# Patient Record
Sex: Male | Born: 1950 | Race: White | Hispanic: No | Smoking: Current every day smoker
Health system: Southern US, Community
[De-identification: ages and names within clinical notes are randomized; demographics above are authoritative.]

## PROBLEM LIST (undated history)

## (undated) DIAGNOSIS — I2510 Atherosclerotic heart disease of native coronary artery without angina pectoris: Secondary | ICD-10-CM

## (undated) DIAGNOSIS — M1990 Unspecified osteoarthritis, unspecified site: Secondary | ICD-10-CM

## (undated) DIAGNOSIS — E119 Type 2 diabetes mellitus without complications: Secondary | ICD-10-CM

## (undated) DIAGNOSIS — Z951 Presence of aortocoronary bypass graft: Secondary | ICD-10-CM

## (undated) DIAGNOSIS — R001 Bradycardia, unspecified: Secondary | ICD-10-CM

## (undated) DIAGNOSIS — M109 Gout, unspecified: Secondary | ICD-10-CM

## (undated) DIAGNOSIS — I714 Abdominal aortic aneurysm, without rupture, unspecified: Secondary | ICD-10-CM

## (undated) DIAGNOSIS — Z8781 Personal history of (healed) traumatic fracture: Secondary | ICD-10-CM

## (undated) DIAGNOSIS — Z8551 Personal history of malignant neoplasm of bladder: Secondary | ICD-10-CM

## (undated) DIAGNOSIS — I10 Essential (primary) hypertension: Secondary | ICD-10-CM

## (undated) DIAGNOSIS — I440 Atrioventricular block, first degree: Secondary | ICD-10-CM

## (undated) DIAGNOSIS — I4891 Unspecified atrial fibrillation: Secondary | ICD-10-CM

## (undated) DIAGNOSIS — D494 Neoplasm of unspecified behavior of bladder: Secondary | ICD-10-CM

## (undated) HISTORY — DX: Essential (primary) hypertension: I10

## (undated) HISTORY — PX: POSTERIOR LAMINECTOMY / DECOMPRESSION LUMBAR SPINE: SUR740

## (undated) HISTORY — DX: Unspecified atrial fibrillation: I48.91

## (undated) HISTORY — PX: KNEE ARTHROSCOPY: SUR90

## (undated) HISTORY — PX: CARDIOVASCULAR STRESS TEST: SHX262

## (undated) HISTORY — PX: CARDIAC CATHETERIZATION: SHX172

## (undated) HISTORY — DX: Atherosclerotic heart disease of native coronary artery without angina pectoris: I25.10

## (undated) HISTORY — PX: CORONARY ARTERY BYPASS GRAFT: SHX141

---

## 1999-07-20 ENCOUNTER — Ambulatory Visit (HOSPITAL_COMMUNITY): Admission: RE | Admit: 1999-07-20 | Discharge: 1999-07-20 | Payer: Self-pay | Admitting: Cardiology

## 1999-07-20 ENCOUNTER — Encounter: Payer: Self-pay | Admitting: *Deleted

## 1999-08-13 ENCOUNTER — Inpatient Hospital Stay (HOSPITAL_COMMUNITY): Admission: EM | Admit: 1999-08-13 | Discharge: 1999-08-20 | Payer: Self-pay | Admitting: Emergency Medicine

## 1999-08-13 ENCOUNTER — Encounter: Payer: Self-pay | Admitting: Cardiology

## 1999-08-15 ENCOUNTER — Encounter: Payer: Self-pay | Admitting: Cardiothoracic Surgery

## 1999-08-15 DIAGNOSIS — Z951 Presence of aortocoronary bypass graft: Secondary | ICD-10-CM

## 1999-08-15 HISTORY — DX: Presence of aortocoronary bypass graft: Z95.1

## 1999-08-16 ENCOUNTER — Encounter: Payer: Self-pay | Admitting: Cardiothoracic Surgery

## 1999-08-17 ENCOUNTER — Encounter: Payer: Self-pay | Admitting: Cardiothoracic Surgery

## 2000-06-26 ENCOUNTER — Encounter: Payer: Self-pay | Admitting: Emergency Medicine

## 2000-06-26 ENCOUNTER — Inpatient Hospital Stay (HOSPITAL_COMMUNITY): Admission: EM | Admit: 2000-06-26 | Discharge: 2000-07-02 | Payer: Self-pay | Admitting: Emergency Medicine

## 2000-06-26 ENCOUNTER — Encounter: Payer: Self-pay | Admitting: Orthopaedic Surgery

## 2000-06-28 ENCOUNTER — Encounter: Payer: Self-pay | Admitting: Orthopaedic Surgery

## 2000-06-30 ENCOUNTER — Encounter: Payer: Self-pay | Admitting: Orthopaedic Surgery

## 2003-03-04 ENCOUNTER — Ambulatory Visit (HOSPITAL_COMMUNITY): Admission: RE | Admit: 2003-03-04 | Discharge: 2003-03-04 | Payer: Self-pay | Admitting: Family Medicine

## 2003-03-21 ENCOUNTER — Ambulatory Visit (HOSPITAL_COMMUNITY): Admission: RE | Admit: 2003-03-21 | Discharge: 2003-03-21 | Payer: Self-pay | Admitting: Family Medicine

## 2007-12-16 ENCOUNTER — Ambulatory Visit: Payer: Self-pay | Admitting: Cardiology

## 2007-12-21 ENCOUNTER — Encounter (HOSPITAL_COMMUNITY): Admission: RE | Admit: 2007-12-21 | Discharge: 2008-01-20 | Payer: Self-pay | Admitting: Cardiology

## 2007-12-21 ENCOUNTER — Ambulatory Visit: Payer: Self-pay | Admitting: Cardiology

## 2008-08-23 ENCOUNTER — Ambulatory Visit (HOSPITAL_COMMUNITY): Admission: RE | Admit: 2008-08-23 | Discharge: 2008-08-23 | Payer: Self-pay | Admitting: Family Medicine

## 2009-02-13 DIAGNOSIS — I10 Essential (primary) hypertension: Secondary | ICD-10-CM | POA: Insufficient documentation

## 2009-02-13 DIAGNOSIS — F17200 Nicotine dependence, unspecified, uncomplicated: Secondary | ICD-10-CM | POA: Insufficient documentation

## 2009-02-13 DIAGNOSIS — I2510 Atherosclerotic heart disease of native coronary artery without angina pectoris: Secondary | ICD-10-CM

## 2009-02-21 ENCOUNTER — Encounter: Payer: Self-pay | Admitting: Adult Health

## 2009-02-21 ENCOUNTER — Ambulatory Visit: Payer: Self-pay | Admitting: Cardiology

## 2009-02-21 DIAGNOSIS — I2589 Other forms of chronic ischemic heart disease: Secondary | ICD-10-CM | POA: Insufficient documentation

## 2009-02-21 DIAGNOSIS — R0602 Shortness of breath: Secondary | ICD-10-CM

## 2009-02-21 DIAGNOSIS — E785 Hyperlipidemia, unspecified: Secondary | ICD-10-CM

## 2009-02-21 HISTORY — DX: Hyperlipidemia, unspecified: E78.5

## 2009-06-20 ENCOUNTER — Encounter (INDEPENDENT_AMBULATORY_CARE_PROVIDER_SITE_OTHER): Payer: Self-pay | Admitting: *Deleted

## 2009-07-07 ENCOUNTER — Ambulatory Visit (HOSPITAL_COMMUNITY): Admission: RE | Admit: 2009-07-07 | Discharge: 2009-07-07 | Payer: Self-pay | Admitting: Internal Medicine

## 2009-09-01 ENCOUNTER — Ambulatory Visit (HOSPITAL_COMMUNITY): Admission: RE | Admit: 2009-09-01 | Discharge: 2009-09-01 | Payer: Self-pay | Admitting: Neurosurgery

## 2009-11-08 ENCOUNTER — Encounter (INDEPENDENT_AMBULATORY_CARE_PROVIDER_SITE_OTHER): Payer: Self-pay | Admitting: *Deleted

## 2010-03-06 NOTE — Assessment & Plan Note (Signed)
Summary: past due for 6 mth f/u per Tammy/tg   Visit Type:  Follow-up Primary Provider:  Dr.Mark Nobie Putnam   History of Present Illness: Kenneth Kelly is a 55 CM who we are following with known history of CAD, s/p CABG in July 2001. This was LIMA to Cx, SVG to RCA, and RIMA to LAD.  He has not been seen in since Nov 2009.  He comes today for medication refills and continued cardiac care.  Most recent stress test 11/09 revaled negative stress EKG, Normal LV size and systolic function, but there was a "somewhat prominent physiologic apical thinning without evidence for myocardial ischemia or infarction."  He has no complaints of chest pain, DOE, fatige. He unfortuately continues to smoke 1-1.5 ppd and has been for 50+ years.  Preventive Screening-Counseling & Management  Alcohol-Tobacco     Smoking Status: quit > 6 months     Smoking Cessation Counseling: yes     Smoke Cessation Stage: precontemplative     Packs/Day: 1.5     Pack years: 55  Current Problems (verified): 1)  Dyslipidemia  (ICD-272.4) 2)  Shortness of Breath  (ICD-786.05) 3)  Other Spec Forms Chronic Ischemic Heart Disease  (ICD-414.8) 4)  Tobacco Abuse  (ICD-305.1) 5)  Hypertension  (ICD-401.9) 6)  Cad  (ICD-414.00)  Current Medications (verified): 1)  Metoprolol Tartrate 25 Mg Tabs (Metoprolol Tartrate) .... Take 1 Tab Two Times A Day 2)  Ecotrin 325 Mg Tbec (Aspirin) .... Take 1 Tab Daily 3)  Lipitor 20 Mg Tabs (Atorvastatin Calcium) .... Take 1 Tab Daily 4)  Advil Pm 200-38 Mg Tabs (Ibuprofen-Diphenhydramine Cit) .... Take As Needed 5)  Amlodipine Besylate 10 Mg Tabs (Amlodipine Besylate) .... Take 1 Tablet By Mouth Once Daily 6)  Actoplus Met 15-500 Mg Tabs (Pioglitazone Hcl-Metformin Hcl) .... Take 1 Tab Two Times A Day 7)  Ecotrin 325 Mg Tbec (Aspirin) .... Take 1 Tab Daily 8)  Metoprolol Tartrate 50 Mg Tabs (Metoprolol Tartrate) .... Take 1 Tab Two Times A Day 9)  Nabumetone 750 Mg Tabs (Nabumetone) .... Take 1 Tab  Two Times A Day 10)  Ramipril 10 Mg Caps (Ramipril) .... Take 1 Tab Daily  Allergies (verified): No Known Drug Allergies  Past History:  Past medical, surgical, family and social histories (including risk factors) reviewed, and no changes noted (except as noted below).  Past Medical History: Reviewed history from 02/13/2009 and no changes required. Current Problems:  TOBACCO ABUSE (ICD-305.1) HYPERTENSION (ICD-401.9) CAD (ICD-414.00)  Past Surgical History: Reviewed history from 02/13/2009 and no changes required. coronary artery bypass grafting times 3 08/15/1999  Family History: Reviewed history and no changes required.  Social History: Reviewed history and no changes required. Smoking Status:  quit > 6 months Packs/Day:  1.5 Pack years:  59  Vital Signs:  Patient profile:   60 year old male Height:      63 inches Weight:      151 pounds BMI:     26.85 Pulse rate:   59 / minute BP sitting:   158 / 87  (right arm)  Vitals Entered By: Dreama Saa, CNA (February 21, 2009 2:01 PM)  Physical Exam  General:  Well developed, well nourished, in no acute distress. Head:  normocephalic and atraumatic Eyes:  PERRLA/EOM intact; conjunctiva and lids normal. Ears:  TM's intact and clear with normal canals and hearing Nose:  no deformity, discharge, inflammation, or lesions Mouth:  Teeth, gums and palate normal. Oral mucosa normal. Lungs:  Clear bilaterally  to auscultation and percussion. Heart:  Distant heart sounds.  RRR. Abdomen:  Bowel sounds positive; abdomen soft and non-tender without masses, organomegaly, or hernias noted. No hepatosplenomegaly. Msk:  Back normal, normal gait. Muscle strength and tone normal. Pulses:  pulses normal in all 4 extremities Extremities:  No clubbing or cyanosis. Neurologic:  Alert and oriented x 3. Skin:  Ruddy Psych:  Normal affect.   EKG  Procedure date:  02/21/2009  Findings:      PVC's noted.    Comments:      Evidence of  remote anterior MI.    Impression & Recommendations:  Problem # 1:  CAD (ICD-414.00) Assessment Unchanged Kenneth Kelly is without complaint.  His last stress myoview did not show any new areas of ischemia.  EF 56%.  Continue current medications.  I have advised him that if he continues to smoke he increases his chance of having a second heart attack, and occluding his current grafts.  He verbalizes understanding. The following medications were removed from the medication list:    Amlodipine Besylate 5 Mg Tabs (Amlodipine besylate) .Marland Kitchen... Take 1 tablet by mouth once daily    Altace 2.5 Mg Caps (Ramipril) .Marland Kitchen... Take 1 tab daily His updated medication list for this problem includes:    Metoprolol Tartrate 25 Mg Tabs (Metoprolol tartrate) .Marland Kitchen... Take 1 tab two times a day    Ecotrin 325 Mg Tbec (Aspirin) .Marland Kitchen... Take 1 tab daily    Amlodipine Besylate 10 Mg Tabs (Amlodipine besylate) .Marland Kitchen... Take 1 tablet by mouth once daily    Metoprolol Tartrate 50 Mg Tabs (Metoprolol tartrate) .Marland Kitchen... Take 1 tab two times a day    Ramipril 10 Mg Caps (Ramipril) .Marland Kitchen... Take 1 tab daily  Problem # 2:  SHORTNESS OF BREATH (ICD-786.05) Assessment: Unchanged This is chronic for him.  He denies changes. The following medications were removed from the medication list:    Amlodipine Besylate 5 Mg Tabs (Amlodipine besylate) .Marland Kitchen... Take 1 tablet by mouth once daily    Altace 2.5 Mg Caps (Ramipril) .Marland Kitchen... Take 1 tab daily His updated medication list for this problem includes:    Metoprolol Tartrate 25 Mg Tabs (Metoprolol tartrate) .Marland Kitchen... Take 1 tab two times a day    Ecotrin 325 Mg Tbec (Aspirin) .Marland Kitchen... Take 1 tab daily    Amlodipine Besylate 10 Mg Tabs (Amlodipine besylate) .Marland Kitchen... Take 1 tablet by mouth once daily    Metoprolol Tartrate 50 Mg Tabs (Metoprolol tartrate) .Marland Kitchen... Take 1 tab two times a day    Ramipril 10 Mg Caps (Ramipril) .Marland Kitchen... Take 1 tab daily  Problem # 3:  TOBACCO ABUSE (ICD-305.1) Counseling is completed.  He has  no plans to quit.  Other Orders: Future Orders: T-Basic Metabolic Panel [id number]) ... 02/25/2009 T-Lipid Profile [id number]) ... 02/25/2009 T-Hepatic Function [id number]) ... 02/25/2009  Patient Instructions: 1)  Your physician recommends that you schedule a follow-up appointment in: 3 months 2)  Your physician recommends that you return for lab work NEXT WEEK 3)  Your physician has recommended you make the following change in your medication: Increase Amlodipine to 10mg  by mouth once daily  Prescriptions: RAMIPRIL 10 MG CAPS (RAMIPRIL) take 1 tab daily  #30 x 6   Entered by:   Larita Fife Via LPN   Authorized by:   Joni Reining, NP   Signed by:   Larita Fife Via LPN on 57/84/6962   Method used:   Electronically to        [facility] [address] #  [id number]* (retail)       [address], [place]  [address]       Ph: [phone]       Fax: [phone]   RxID:   [id number] METOPROLOL TARTRATE 50 MG TABS (METOPROLOL TARTRATE) take 1 tab two times a day  #60 x 6   Entered by:   Larita Fife Via LPN   Authorized by:   Joni Reining, NP   Signed by:   Larita Fife Via LPN on 66/44/0347   Method used:   Electronically to        [facility] [address] [phone]* (retail)       [address] [place], [place]  [address]       Ph: [phone]       Fax: [phone]   RxID:   [id number] LIPITOR 20 MG TABS (ATORVASTATIN CALCIUM) take 1 tab daily  #30 x 6   Entered by:   Larita Fife Via LPN   Authorized by:   Joni Reining, NP   Signed by:   Larita Fife Via LPN on 42/70/6237   Method used:   Electronically to        [facility] [phone]* (retail)       [address], [place]  [address]       Ph: [phone]       Fax: [phone]   RxID:   [id number] AMLODIPINE BESYLATE 10 MG TABS (AMLODIPINE BESYLATE) take 1 tablet by mouth once daily  #30 x 6   Entered by:   Larita Fife Via LPN   Authorized by:   Joni Reining, NP   Signed by:   Larita Fife Via LPN on 67/89/3810   Method  used:   Electronically to        [facility] [address] [phone]* (retail)       [address] [place]  [address]       Ph: [phone]       Fax: [phone]   RxID:   [id number]

## 2010-03-06 NOTE — Letter (Signed)
Summary: Appointment - Reminder 2  [facility] [place]. [address], [place] [address]   Phone: [phone]  Fax: [phone]     Jun 20, 2009 MRN: [id number]   Kenneth Kelly [address] [place], [place]  [address]   Dear [name],  Our records indicate that it is time to schedule a follow-up appointment.  Joni Reining NP        recommended that you follow up with [place] in   05/2009         . It is very important that we reach you to schedule this appointment. We look forward to participating in your health care needs. Please contact us at the number listed above at your earliest convenience to schedule your appointment.  If you are unable to make an appointment at this time, give [place] a call so we can update our records.     Sincerely,   Glass blower/designer

## 2010-03-06 NOTE — Letter (Signed)
Summary: Appointment - Reminder 2  [facility] [place]. [address], [place] [address]   Phone: [phone]  Fax: [phone]     November 08, 2009 MRN: [id number]   Kenneth Kelly [address] [place], [place]  [address]   Dear [name],  Our records indicate that it is time to schedule a follow-up appointment.    Joni Reining NP       recommended that you follow up with [place] in    05/24/09        . It is very important that we reach you to schedule this appointment. We look forward to participating in your health care needs. Please contact us at the number listed above at your earliest convenience to schedule your appointment.  If you are unable to make an appointment at this time, give [place] a call so we can update our records.     Sincerely,   Glass blower/designer

## 2010-04-21 LAB — BASIC METABOLIC PANEL
BUN: 14 mg/dL (ref 6–23)
CO2: 29 mEq/L (ref 19–32)
Calcium: 9.3 mg/dL (ref 8.4–10.5)
Chloride: 104 mEq/L (ref 96–112)
Creatinine, Ser: 0.78 mg/dL (ref 0.4–1.5)
GFR calc Af Amer: >60 mL/min (ref >60)
GFR calc non Af Amer: >60 mL/min (ref >60)
Glucose, Bld: 158 mg/dL — ABNORMAL HIGH (ref 70–99)
Potassium: 4.5 mEq/L (ref 3.5–5.1)
Sodium: 139 mEq/L (ref 135–145)

## 2010-04-21 LAB — GLUCOSE, CAPILLARY
Glucose-Capillary: 159 mg/dL — ABNORMAL HIGH (ref 70–99)
Glucose-Capillary: 166 mg/dL — ABNORMAL HIGH (ref 70–99)
Glucose-Capillary: 174 mg/dL — ABNORMAL HIGH (ref 70–99)
Glucose-Capillary: 239 mg/dL — ABNORMAL HIGH (ref 70–99)

## 2010-04-21 LAB — CBC
HCT: 43.5 % (ref 39.0–52.0)
Hemoglobin: 14.8 g/dL (ref 13.0–17.0)
MCH: 34 pg (ref 26.0–34.0)
MCHC: 34 g/dL (ref 30.0–36.0)
MCV: 100.2 fL — ABNORMAL HIGH (ref 78.0–100.0)
Platelets: 220 10*3/uL (ref 150–400)
RBC: 4.34 MIL/uL (ref 4.22–5.81)
RDW: 12.8 % (ref 11.5–15.5)
WBC: 9.2 10*3/uL (ref 4.0–10.5)

## 2010-04-21 LAB — SURGICAL PCR SCREEN
MRSA, PCR: NEGATIVE
Staphylococcus aureus: POSITIVE — AB

## 2010-06-19 NOTE — Assessment & Plan Note (Signed)
[facility]                       [place] [facility] OFFICE NOTE   NAME:Kenneth Kelly, Kenneth Kelly                       MRN:          [id number]  DATE:12/16/2007                            DOB:          Jun 26, 1950    PRIMARY CARE PHYSICIAN:  Corrie Mckusick, MD   REASON FOR VISIT:  Re-established cardiology followup.   HISTORY OF PRESENT ILLNESS:  Kenneth Kelly is a 60 year old male followed in  the past by Dr. Daleen Squibb with a history of multivessel cardiovascular  disease status post coronary artery bypass grafting in July 2001  including a LIMA to the circumflex, saphenous vein graft to the right  coronary artery, and a RIMA to the left anterior descending.  His left  ventricular ejection fraction was 56% at that time with moderate  anterior hypokinesis and no significant mitral regurgitation.  He has  not had any regular cardiology followup since 2001.  He continues to  smoke cigarettes and otherwise reports compliance with his medications  which are outlined below.  He does not exercise regularly.  Recent labs  show actually fairly good lipid control with an LDL of 70, HDL 46, total  cholesterol 123, and triglycerides of 35.  He had a hemoglobin of 14.8,  potassium of 4.1, BUN of 16, and a creatinine of 0.84.  His  electrocardiogram today shows sinus rhythm with a prolonged PR interval  of 212 msec, left atrial enlargement, and decreased anterior R-wave  progression suggestive of previous infarct with nonspecific T-wave  changes.  Symptomatically, he denies any significant angina at  relatively low levels of activity, and he has had no problems with  orthopnea, PND, palpitations, or syncope.  Today, we talked about  smoking cessation, followup ischemic testing, and further medication  titration for blood pressure control.   ALLERGIES:  No known drug allergies.   Present medications include:  1. Enteric-coated aspirin 325 mg p.o. daily.  2. Metoprolol 50 mg  p.o. b.i.d.  3. Lipitor 20 mg p.o. daily.  4. Hydrochlorothiazide 25 mg p.o. daily.  5. Actos plus metformin 50 mg/500 mg p.o. b.i.d.  6. Ramipril 10 mg p.o. daily.   REVIEW OF SYSTEMS:  As outlined above.  He denies any obvious  claudication symptoms.  Otherwise, negative.   PHYSICAL EXAMINATION:  VITAL SIGNS:  Blood pressure 160/100, heart rate  is in the 60s and regular, weight is 158 pounds, height 5 feet 3 inches.  The patient is comfortable and in no acute distress.  HEENT:  Conjunctiva is normal.  Oropharynx is clear.  NECK:  Supple.  No loud carotid bruits.  No thyromegaly.  LUNGS:  Exhibit diminished breath sounds.  No labored breathing or  wheezing.  CARDIAC:  A regular rate and rhythm.  No obvious S3 gallop.  Soft  systolic murmur noted.  No pericardial rub.  ABDOMEN:  Soft, nontender.  No bruits.  No pulsatile mass.  EXTREMITIES:  Exhibit no frank pitting edema.  Distal pulses are 2+.  SKIN:  Warm and dry.  MUSCULOSKELETAL:  No kyphosis noted.  NEUROPSYCHIATRIC:  The patient is alert and oriented x3.  Affect  is  somewhat blunted.   IMPRESSION AND RECOMMENDATIONS:  1. Multivessel cardiovascular disease with preserved ejection fraction      status post previous coronary artery bypass grafting as detailed      above in 2001.  At this point, I plan a followup adenosine Myoview      on medical therapy for ischemic surveillance and will plan to      otherwise see him back in 6 months assuming this test reveals no      progressive concerning findings.  He has evidence of previous      anterior wall infarction based on his electrocardiogram.  He is not      reporting any heart failure symptoms or progressive angina      symptoms.  2. Ongoing tobacco abuse.  Today, we talked about smoking cessation.      He states that he was able to quit for approximately a year after      his surgery.  3. Hyperlipidemia, well controlled on present regimen.  4. Hypertension, not optimally  controlled.  We would prefer not to      advance his beta-blocker any further for fear of making him      symptomatic due to progressive bradycardia.  His other      antihypertensives are well dosed.  We will add Norvasc 5 mg daily      and can follow him from there.     Jonelle Sidle, MD  Electronically Signed    SGM/MedQ  DD: 12/16/2007  DT: 12/17/2007  Job #: [id number]   cc:   Corrie Mckusick, M.D.

## 2010-06-22 NOTE — Op Note (Signed)
[place]  Patient:    Kenneth Kelly, Kenneth Kelly                 MRN: [id number] Proc. Date: 08/15/99 Adm. Date:  [id number] Attending:  Waldo Laine CC:         Gwenith Daily. Tyrone Sage, M.D.             Thomas C. Wall, M.D. [facility]                           Operative Report  PREOPERATIVE DIAGNOSIS:  Coronary occlusive disease.  POSTOPERATIVE DIAGNOSIS:  Coronary occlusive disease.  OPERATION PERFORMED:   Coronary artery bypass grafting x 3 with the left internal mammary artery to the circumflex coronary artery, the right internal mammary to the left anterior descending coronary artery and reversed saphenous vein graft to the distal right coronary artery.  SURGEON:  Gwenith Daily. Tyrone Sage, M.D.  ASSISTANT:  Sherrie George, P.A.  ANESTHESIA:  General.  INDICATIONS FOR PROCEDURE:  The patient is a 60 year old male with previous smoking history and strong family history of coronary occlusive disease who presented with vague chest discomfort.  He had underwent stress test which was positive leading to cardiac catheterization.  At the time of catheterization he was found to have a long segmental 50% stenosis of the right coronary artery, total occlusion of the left anterior descending coronary artery past the take-off of the diagonal and greater than 80% stenosis of the large circumflex branch.  Overall ventricular function appeared preserved.  Because of the patients symptoms and three-vessel disease and positive stress test, coronary artery bypass grafting was recommended.  The patient agreed and signed informed consent.  DESCRIPTION OF PROCEDURE:  With Swan-Ganz and arterial line monitors in place, the patient underwent general endotracheal anesthesia without incident.  The skin of the chest and legs was prepped with Betadine and draped in the usual sterile manner.  A segment of vein was harvested from the right lower extremity.  A median  sternotomy was performed.  The left internal mammary artery was dissected down as a pedicle graft.  The distal artery was divided and had excellent free flow.  In similar fashion, the right internal mammary artery was dissected down and had good free flow.  Pericardium was opened. Overall ventricular function appeared preserved.  Patient was systemically heparinized.  The ascending aorta and the right atrium were cannulated in the aortic root.  A bent cardioplegia needle was introduced into the ascending aorta.  The patient was placed on cardiopulmonary bypass at 2.4 L per minute per meter squared.  Sites for anastomosis were selected and dissected out of the epicardium.  Consideration of placing the right mammary to the circumflex was entertained though the length was not long enough to go to the transverse sinus.  It was decided to place the left mammary to the circumflex and the right mammary to the LAD and a vein graft to the right coronary artery.  The patients body temperature was then cooled to 30 degrees.  The aortic crossclamp was applied.  500 cc of cold blood potassium cardioplegia was administered with rapid diastolic arrest of the heart.  Myocardial and septal temperature was monitored throughout the crossclamp period.  Attention was turned first to the distal right coronary artery which was opened and admitted a 1.5 mm probe.  Using a running 7-0 Prolene, distal anastomosis was performed.  Attention was then turned to the  circumflex coronary artery which was a thickened vessel but admitted a 1.5 mm probe.  Using a running 8-0 Prolene the left internal mammary artery was anastomosed to the circumflex coronary artery.  The vessel was tacked to the epicardium.  Attention was then turned to the left anterior descending coronary artery which was totally occluded vessel.  In the midportion, the artery was opened and admitted a 1.5 mm probe a short distance proximally and a 1 mm probe  distally.  Using a running 8-0 Prolene, a right internal mammary artery as a pedicle graft was anastomosed to the left anterior descending coronary artery.  With release of the Edwards bulldog on both the right mammary and the left mammary, there was prompt rise in myocardial septal temperature.  The aortic cross clamp was removed.  Total cross clamp time was 55 minutes.  The patient required electrical defibrillation to return into a sinus rhythm.  Partial occlusion clamp was placed on the ascending aorta.  A single punch aortotomy was performed.  The vein graft to the right coronary artery was anastomosed to the ascending aorta.  Air was evacuated from the graft and a partial occlusion clamp was removed.  Sites of anastomosis were inspected and free of bleeding. The patient was then ventilated and weaned from cardiopulmonary bypass without difficulty.  He remained hemodynamically stable.  He was decannulated in the usual fashion.  Protamine sulfate was administered.  With the operative field hemostatic, two atrial and two ventricular pacing wires were applied.  A left pleural tube and a right pleural tube were placed.  The right mammary crossing the midline over the ascending aorta was covered with mediastinal tissue to protect the ____________ The sternum was closed with #6 stainless steel wire, the fascia was closed with interrupted 0 Vicryl and running 3-0 Vicryl in the subcutaneous tissue and 4-0 subcuticular stitch in the skin edges.  Dry dressings were applied.  The sponge and needle counts were reported as correct at the completion of the procedure.  The patient tolerated the procedure without obvious complication and was transferred to the surgical intensive care unit for further postoperative care. DD:  08/15/99 TD:  08/15/99 Job: [id number]

## 2010-06-22 NOTE — Discharge Summary (Signed)
[place]. [facility]  Patient:    Kenneth Kelly, Kenneth Kelly                 MRN: [id number] Adm. Date:  [id number] Disc. Date: 08/20/99 Attending:  Waldo Laine Dictator:   Carlye Grippe CC:         [facility]; Valera Castle, M.D.             Gwenith Daily Tyrone Sage, M.D., CVTS; Patrica Duel, M.D.                           Discharge Summary  HISTORY OF PRESENT ILLNESS:  This is a 60 year old male with known coronary artery disease having previously been cathed on July 20, 1999 by Dr. Gerri Spore.  At that time he was noted to have 30% distal left main disease, 40% proximal LAD disease, a total occlusion of the mid-LAD, and the mid to distal LAD was filled by left-to-left collaterals.  Additionally, there was a 30% lesion at the diagonal #1 origin, a 25% left circumflex lesion, an 80% mid and 80% distal circumflex lesion, an 80% stenosis of the obtuse marginal #3, a 40% proximal right coronary artery stenosis with moderate hypokinesis of the anteroapical wall and a 66% ejection fraction with no mitral regurgitation. The patient was seen on the date of admission for preop labs for coronary artery bypass grafting originally scheduled for August 15, 1999.  During that procedure, left-sided chest pain presented and the patient was felt to require admission for further evaluation and treatment prior to his surgery.  PAST MEDICAL HISTORY:  1. Coronary artery disease as described above.  2. Hypertension.  3. Tobacco use.  MEDICATIONS ON ADMISSION:  1. Metoprolol 25 mg b.i.d.  2. Altace 2.5 mg q.d.  3. Aspirin 1 q.d.  ALLERGIES:  No known allergies.  FAMILY HISTORY, SOCIAL HISTORY, REVIEW OF SYSTEMS, AND PHYSICAL EXAM:  Please see the history and physical done at time of admission.  HOSPITAL COURSE:  The patient was admitted.  The patient had his serial enzymes checked and these were negative for myocardial infarction.  The patient was also  noted to be bradycardic on admission and Lopressor dosage was decreased.  The patient was reevaluated by Dr. Tyrone Sage and the surgery he was scheduled.  PROCEDURE:  On August 15, 1999 the patient underwent the following procedure: Coronary artery bypass grafting x 3.  The following grafts were placed:  1. Left internal mammary artery to the circumflex.  2. Saphenous vein graft to the right coronary artery.  3. Right internal mammary artery to the LAD.  Crossclamp time was 105     minutes, pump time 55 minutes.  The patient tolerated the procedure well     and was taken to the surgical intensive care unit in stable condition.  POSTOPERATIVE HOSPITAL COURSE:  The patient has done quite well.  He was extubated without difficulty.  Routine lines and monitoring were discontinued in the routine step-wise manner and he remained hemodynamically stable. Cardiac index postop day #1 was noted at 3.2 and he had good urine output.  He was started on a course of gentle diuresis and aggressive pulmonary toilet was initiated as he had some difficulty with oxygen weaning.  He responded well to these measures and oxygen has been weaned.  Additionally, he is tolerating routine cardiac rehabilitation, phase 1, modalities.  His incisions are healing well without signs of infection.  He  is tolerating diet.  He is maintaining a normal sinus rhythm.  He is overall felt to be tentatively stable for discharge on the morning of August 21, 1999 pending morning reevaluation and, in particular, his cardiopulmonary status.  MEDICATIONS ON DISCHARGE:  1. Enteric-coated aspirin 325 mg q.d.  2. Altace 5 mg q.d.  3. Lopressor 25 mg b.i.d.  4. Tylox 1-2 q.4-6h. p.r.n.  5. Multivitamin 1 q.d.  FOLLOWUP:  Followup will include Dr. Daleen Squibb in two weeks, Dr. Tyrone Sage in three weeks.  FINAL DIAGNOSES:  1. Severe coronary artery disease.  2. Hypertension.  3. Tobacco use.  Most recent laboratory showed (August 17, 1999)  electrolytes - sodium 138, potassium 4.3, chloride 99, CO2 31, glucose 126, BUN 10, creatinine 0.8.  CBC showed white blood cell count 13.7, hemoglobin 10.2, hematocrit 29.5, platelet count 140,000.  INSTRUCTIONS:  The patient will receive written instructions regarding medications, activity, diet, wound care, and followup. DD:  08/19/99 TD:  08/19/99 Job: [id number]

## 2010-06-22 NOTE — Cardiovascular Report (Signed)
[place]. [facility]  Patient:    Kenneth Kelly, Kenneth Kelly                 MRN: [id number] Proc. Date: 07/20/99 Adm. Date:  [id number] Disc. Date: [id number] Attending:  Daisey Must CC:         Jonell Cluck, M.D., [place], [place]             Jesse Sans. Wall, M.D. [facility]             Cardiac Catheterization Lab                        Cardiac Catheterization  PROCEDURE PERFORMED:  Left heart catheterization with coronary angiography and left ventriculography.  INDICATIONS:  [name] is a 60 year old male with multiple cardiovascular risk factors.  He underwent a routine screening exercise treadmill test which was markedly positive for ischemia at a low workload.  He was referred for cardiac catheterization.  PROCEDURAL NOTE:  A 6-French sheath was placed in the right femoral artery. Standard Judkins 6-French catheters were utilized.  Contrast was Omnipaque. There were no complications.  RESULTS:  Hemodynamics:  Left ventricular pressure 140/22.  Aortic pressure 136/78.  No aortic valve gradient.  Left ventriculogram:  There is moderate hypokinesis of the anterior apical wall.  Ejection fraction calculated at 66%.  No mitral regurgitation.  Coronary arteriography (right dominant): 1. The left main has a distal 30% stenosis. 2. The LAD has a proximal tubular 40% stenosis.  The mid LAD is 100% occluded    just after a septal perforator.  The mid to distal LAD beyond the occlusion    filled by left-to-left collaterals.  The length of this total occlusion in    the mid LAD appears to be about 1 cm long, and there is significant    calcification at the distal aspect of this occlusion.  There is a normal    sized first diagonal arising from the proximal LAD which has a 30% stenosis    at its origin. 3. The left circumflex has a 25% stenosis in the mid vessel followed by an 80%    stenosis in the mid vessel across the origin of OM-2.  In the distal vessel  is an 80% stenosis.  There is a small OM-1, small OM-2, and a very large    branch OM-3 jeopardized by both of the above 80% stenoses.  There is a 30%    stenosis in the proximal portion of OM-3. 4. The right coronary artery has a tubular 40% stenosis in the proximal    vessel.  It gives rise to a normal sized posterior descending artery, a    small first posterolateral branch, and a normal second posterolateral    branch.  IMPRESSIONS: 1. Left ventricular systolic function characterized by hypokinesis of the    anterior apical wall but overall preserved ejection fraction. 2. Significant two-vessel coronary artery disease as described.  The total    occlusion of the left anterior descending artery does not appear to be    amenable to percutaneous intervention because of technical features    described above.  The left circumflex is amenable to percutaneous    intervention.  These results were discussed with Dr. Daleen Squibb.  Because of    incomplete revascularization that the percutaneous route would provide,    the preference at this time is for coronary bypass grafting.  PLAN:  The patient will be  referred for evaluation for coronary artery bypass grafting on an elective basis.  If he is felt not to be a surgical candidate due to anatomical considerations or other, percutaneous intervention of the left circumflex could be performed.DD:  07/20/99 TD:  07/23/99 Job: [id number]

## 2011-01-22 ENCOUNTER — Encounter: Payer: Self-pay | Admitting: Cardiology

## 2016-01-04 DIAGNOSIS — E782 Mixed hyperlipidemia: Secondary | ICD-10-CM | POA: Diagnosis not present

## 2016-01-04 DIAGNOSIS — E663 Overweight: Secondary | ICD-10-CM | POA: Diagnosis not present

## 2016-01-04 DIAGNOSIS — M549 Dorsalgia, unspecified: Secondary | ICD-10-CM | POA: Diagnosis not present

## 2016-01-04 DIAGNOSIS — E119 Type 2 diabetes mellitus without complications: Secondary | ICD-10-CM | POA: Diagnosis not present

## 2016-01-04 DIAGNOSIS — R310 Gross hematuria: Secondary | ICD-10-CM | POA: Diagnosis not present

## 2016-01-04 DIAGNOSIS — Z6826 Body mass index (BMI) 26.0-26.9, adult: Secondary | ICD-10-CM | POA: Diagnosis not present

## 2016-01-04 DIAGNOSIS — I10 Essential (primary) hypertension: Secondary | ICD-10-CM | POA: Diagnosis not present

## 2016-01-10 ENCOUNTER — Other Ambulatory Visit (HOSPITAL_COMMUNITY): Payer: Self-pay | Admitting: Registered Nurse

## 2016-01-10 DIAGNOSIS — M549 Dorsalgia, unspecified: Secondary | ICD-10-CM

## 2016-01-10 DIAGNOSIS — R310 Gross hematuria: Secondary | ICD-10-CM

## 2016-01-12 ENCOUNTER — Ambulatory Visit (HOSPITAL_COMMUNITY)
Admission: RE | Admit: 2016-01-12 | Discharge: 2016-01-12 | Disposition: A | Discharge status: Home / Self Care | Payer: Commercial Managed Care - HMO | Source: Ambulatory Visit | Attending: Registered Nurse | Admitting: Registered Nurse

## 2016-01-12 DIAGNOSIS — R310 Gross hematuria: Secondary | ICD-10-CM | POA: Diagnosis not present

## 2016-01-12 DIAGNOSIS — N400 Enlarged prostate without lower urinary tract symptoms: Secondary | ICD-10-CM | POA: Insufficient documentation

## 2016-01-12 DIAGNOSIS — I714 Abdominal aortic aneurysm, without rupture: Secondary | ICD-10-CM | POA: Diagnosis not present

## 2016-01-12 DIAGNOSIS — K8020 Calculus of gallbladder without cholecystitis without obstruction: Secondary | ICD-10-CM | POA: Insufficient documentation

## 2016-01-12 DIAGNOSIS — M4186 Other forms of scoliosis, lumbar region: Secondary | ICD-10-CM | POA: Diagnosis not present

## 2016-01-12 DIAGNOSIS — Z951 Presence of aortocoronary bypass graft: Secondary | ICD-10-CM | POA: Diagnosis not present

## 2016-01-12 DIAGNOSIS — I700 Atherosclerosis of aorta: Secondary | ICD-10-CM | POA: Insufficient documentation

## 2016-01-12 DIAGNOSIS — N329 Bladder disorder, unspecified: Secondary | ICD-10-CM | POA: Insufficient documentation

## 2016-01-12 DIAGNOSIS — M549 Dorsalgia, unspecified: Secondary | ICD-10-CM

## 2016-01-12 MED ORDER — IOPAMIDOL (ISOVUE-300) INJECTION 61%
100.0000 mL | Freq: Once | INTRAVENOUS | Status: AC | PRN
  Administered 2016-01-12: 100 mL via INTRAVENOUS

## 2016-01-18 ENCOUNTER — Other Ambulatory Visit: Payer: Self-pay | Admitting: Urology

## 2016-01-18 DIAGNOSIS — I714 Abdominal aortic aneurysm, without rupture: Secondary | ICD-10-CM | POA: Diagnosis not present

## 2016-01-18 DIAGNOSIS — C670 Malignant neoplasm of trigone of bladder: Secondary | ICD-10-CM | POA: Diagnosis not present

## 2016-01-18 DIAGNOSIS — R310 Gross hematuria: Secondary | ICD-10-CM | POA: Diagnosis not present

## 2016-01-18 DIAGNOSIS — M109 Gout, unspecified: Secondary | ICD-10-CM | POA: Diagnosis not present

## 2016-01-22 ENCOUNTER — Encounter (HOSPITAL_COMMUNITY)
Admission: RE | Admit: 2016-01-22 | Discharge: 2016-01-22 | Disposition: A | Discharge status: Home / Self Care | Payer: Commercial Managed Care - HMO | Source: Ambulatory Visit | Attending: Urology | Admitting: Urology

## 2016-01-22 ENCOUNTER — Encounter (HOSPITAL_COMMUNITY): Payer: Self-pay

## 2016-01-22 DIAGNOSIS — E119 Type 2 diabetes mellitus without complications: Secondary | ICD-10-CM

## 2016-01-22 DIAGNOSIS — Z01810 Encounter for preprocedural cardiovascular examination: Secondary | ICD-10-CM | POA: Insufficient documentation

## 2016-01-22 DIAGNOSIS — I10 Essential (primary) hypertension: Secondary | ICD-10-CM

## 2016-01-22 DIAGNOSIS — I2510 Atherosclerotic heart disease of native coronary artery without angina pectoris: Secondary | ICD-10-CM | POA: Diagnosis not present

## 2016-01-22 DIAGNOSIS — Z8042 Family history of malignant neoplasm of prostate: Secondary | ICD-10-CM | POA: Diagnosis not present

## 2016-01-22 DIAGNOSIS — I252 Old myocardial infarction: Secondary | ICD-10-CM | POA: Diagnosis not present

## 2016-01-22 DIAGNOSIS — N138 Other obstructive and reflux uropathy: Secondary | ICD-10-CM | POA: Diagnosis not present

## 2016-01-22 DIAGNOSIS — M109 Gout, unspecified: Secondary | ICD-10-CM | POA: Diagnosis not present

## 2016-01-22 DIAGNOSIS — Z7984 Long term (current) use of oral hypoglycemic drugs: Secondary | ICD-10-CM | POA: Diagnosis not present

## 2016-01-22 DIAGNOSIS — M1990 Unspecified osteoarthritis, unspecified site: Secondary | ICD-10-CM | POA: Diagnosis not present

## 2016-01-22 DIAGNOSIS — I714 Abdominal aortic aneurysm, without rupture: Secondary | ICD-10-CM | POA: Diagnosis not present

## 2016-01-22 DIAGNOSIS — Z951 Presence of aortocoronary bypass graft: Secondary | ICD-10-CM | POA: Diagnosis not present

## 2016-01-22 DIAGNOSIS — Z01812 Encounter for preprocedural laboratory examination: Secondary | ICD-10-CM

## 2016-01-22 DIAGNOSIS — F17210 Nicotine dependence, cigarettes, uncomplicated: Secondary | ICD-10-CM | POA: Diagnosis not present

## 2016-01-22 DIAGNOSIS — E7800 Pure hypercholesterolemia, unspecified: Secondary | ICD-10-CM | POA: Diagnosis not present

## 2016-01-22 DIAGNOSIS — Z833 Family history of diabetes mellitus: Secondary | ICD-10-CM | POA: Diagnosis not present

## 2016-01-22 DIAGNOSIS — F329 Major depressive disorder, single episode, unspecified: Secondary | ICD-10-CM | POA: Diagnosis not present

## 2016-01-22 DIAGNOSIS — D494 Neoplasm of unspecified behavior of bladder: Secondary | ICD-10-CM | POA: Diagnosis present

## 2016-01-22 DIAGNOSIS — C670 Malignant neoplasm of trigone of bladder: Secondary | ICD-10-CM | POA: Diagnosis not present

## 2016-01-22 DIAGNOSIS — N401 Enlarged prostate with lower urinary tract symptoms: Secondary | ICD-10-CM | POA: Diagnosis not present

## 2016-01-22 DIAGNOSIS — K219 Gastro-esophageal reflux disease without esophagitis: Secondary | ICD-10-CM | POA: Diagnosis not present

## 2016-01-22 HISTORY — DX: Gout, unspecified: M10.9

## 2016-01-22 HISTORY — DX: Neoplasm of unspecified behavior of bladder: D49.4

## 2016-01-22 LAB — CBC
HCT: 38 % — ABNORMAL LOW (ref 39.0–52.0)
HEMOGLOBIN: 13 g/dL (ref 13.0–17.0)
MCH: 32.5 pg (ref 26.0–34.0)
MCHC: 34.2 g/dL (ref 30.0–36.0)
MCV: 95 fL (ref 78.0–100.0)
Platelets: 227 10*3/uL (ref 150–400)
RBC: 4 MIL/uL — ABNORMAL LOW (ref 4.22–5.81)
RDW: 12.5 % (ref 11.5–15.5)
WBC: 7.6 10*3/uL (ref 4.0–10.5)

## 2016-01-22 LAB — GLUCOSE, CAPILLARY: GLUCOSE-CAPILLARY: 171 mg/dL — AB (ref 65–99)

## 2016-01-22 LAB — BASIC METABOLIC PANEL
ANION GAP: 9 (ref 5–15)
BUN: 14 mg/dL (ref 6–20)
CALCIUM: 9.3 mg/dL (ref 8.9–10.3)
CO2: 30 mmol/L (ref 22–32)
CREATININE: 0.88 mg/dL (ref 0.61–1.24)
Chloride: 104 mmol/L (ref 101–111)
GFR calc Af Amer: >60 mL/min (ref >60)
GFR calc non Af Amer: >60 mL/min (ref >60)
GLUCOSE: 176 mg/dL — AB (ref 65–99)
Potassium: 4.2 mmol/L (ref 3.5–5.1)
Sodium: 143 mmol/L (ref 135–145)

## 2016-01-22 NOTE — Patient Instructions (Addendum)
ZAAHIR COBBETT  01/22/2016   Your procedure is scheduled on: 01/23/16  Report to [facility] Main  Entrance take [place]  elevators to 3rd floor to  [place] at [place] AM.  Call this number if you have problems the morning of surgery [phone]   Remember: ONLY 1 PERSON MAY GO WITH YOU TO SHORT STAY TO GET  READY MORNING OF YOUR SURGERY.  Do not eat food or drink liquids :After Midnight.     Take these medicines the morning of surgery with A SIP OF WATER: METOPROLOL, AMLODIPINE DO NOT TAKE ANY DIABETIC MEDICATIONS DAY OF YOUR SURGERY                               You may not have any metal on your body including hair pins and              piercings  Do not wear jewelry, make-up, lotions, powders or perfumes, deodorant             Do not wear nail polish.  Do not shave  48 hours prior to surgery.              Men may shave face and neck.   Do not bring valuables to the hospital. [place].  Contacts, dentures or bridgework may not be worn into surgery.  Leave suitcase in the car. After surgery it may be brought to your room.     Special Instructions: N/A              Please read over the following fact sheets you were given: _____________________________________________________________________             How to Manage Your Diabetes Before and After Surgery  Why is it important to control my blood sugar before and after surgery? . Improving blood sugar levels before and after surgery helps healing and can limit problems. . A way of improving blood sugar control is eating a healthy diet by: o  Eating less sugar and carbohydrates o  Increasing activity/exercise o  Talking with your doctor about reaching your blood sugar goals . High blood sugars (greater than 180 mg/dL) can raise your risk of infections and slow your recovery, so you will need to focus on controlling your diabetes during the weeks  before surgery. . Make sure that the doctor who takes care of your diabetes knows about your planned surgery including the date and location.  How do I manage my blood sugar before surgery? . Check your blood sugar at least 4 times a day, starting 2 days before surgery, to make sure that the level is not too high or low. o Check your blood sugar the morning of your surgery when you wake up and every 2 hours until you get to the Short Stay unit. . If your blood sugar is less than 70 mg/dL, you will need to treat for low blood sugar: o Do not take insulin. o Treat a low blood sugar (less than 70 mg/dL) with  cup of clear juice (cranberry or apple), 4 glucose tablets, OR glucose gel. o Recheck blood sugar in 15 minutes after treatment (to make sure it is greater than 70 mg/dL). If your  blood sugar is not greater than 70 mg/dL on recheck, call [phone] for further instructions. . Report your blood sugar to the short stay nurse when you get to Short Stay.  . If you are admitted to the hospital after surgery: o Your blood sugar will be checked by the staff and you will probably be given insulin after surgery (instead of oral diabetes medicines) to make sure you have good blood sugar levels. o The goal for blood sugar control after surgery is 80-180 mg/dL.   WHAT DO I DO ABOUT MY DIABETES MEDICATION?  Marland Kitchen Do not take oral diabetes medicines (pills) the morning of surgery.   .    Patient Signature:  Date:   Nurse Signature:  Date:   Reviewed and Endorsed by [facility] Patient Education Committee, August 2015[facility] - Preparing for Surgery Before surgery, you can play an important role.  Because skin is not sterile, your skin needs to be as free of germs as possible.  You can reduce the number of germs on your skin by washing with CHG (chlorahexidine gluconate) soap before surgery.  CHG is an antiseptic cleaner which kills germs and bonds with the skin to continue killing germs even  after washing. Please DO NOT use if you have an allergy to CHG or antibacterial soaps.  If your skin becomes reddened/irritated stop using the CHG and inform your nurse when you arrive at Short Stay. Do not shave (including legs and underarms) for at least 48 hours prior to the first CHG shower.  You may shave your face/neck. Please follow these instructions carefully:  1.  Shower with CHG Soap the night before surgery and the  morning of Surgery.  2.  If you choose to wash your hair, wash your hair first as usual with your  normal  shampoo.  3.  After you shampoo, rinse your hair and body thoroughly to remove the  shampoo.                           4.  Use CHG as you would any other liquid soap.  You can apply chg directly  to the skin and wash                       Gently with a scrungie or clean washcloth.  5.  Apply the CHG Soap to your body ONLY FROM THE NECK DOWN.   Do not use on face/ open                           Wound or open sores. Avoid contact with eyes, ears mouth and genitals (private parts).                       Wash face,  Genitals (private parts) with your normal soap.             6.  Wash thoroughly, paying special attention to the area where your surgery  will be performed.  7.  Thoroughly rinse your body with warm water from the neck down.  8.  DO NOT shower/wash with your normal soap after using and rinsing off  the CHG Soap.                9.  Pat yourself dry with a clean towel.  10.  Wear clean pajamas.            11.  Place clean sheets on your bed the night of your first shower and do not  sleep with pets. Day of Surgery : Do not apply any lotions/deodorants the morning of surgery.  Please wear clean clothes to the hospital/surgery center.  ________________________________  ______________________________  ________________________________________________________________________

## 2016-01-22 NOTE — H&P (Signed)
CC: I have bladder cancer.  HPI: Kenneth Kelly is a 65 year-old male patient who was referred by Dr. Sharilyn Sites, MD who is here for bladder cancer.  His problem was diagnosed approximately 12/06/2015. The bladder cancer was found because of blood in his urine.   He has not received treatment for bladder cancer.   Mr. Kenneth Kelly is a 65 yo WM who had the onset a month ago of gross hematuria with clots that has been intermittent. He is voiding without difficulty but he has a lot of frequency and nocturia. He had a CT that shows a large mass at the base of the bladder and trigone. He has had transient hematuria about 2-3 months ago. He has no other GU history. He also reports a 5-7 day history of right knee pain and swelling and has a history of gout.      ALLERGIES: None   MEDICATIONS: Lisinopril 10 mg tablet  Metformin Hcl 1,000 mg tablet  Metoprolol Tartrate 50 mg tablet  Simvastatin 20 mg tablet  Amlodipine Besylate 10 mg tablet     GU PSH: None   NON-GU PSH: Coronary Artery Bypass Grafting Lumbar Laminectomy    GU PMH: None   NON-GU PMH: Arthritis Depression Diabetes Type 2 GERD Gout Hypercholesterolemia Hypertension Myocardial Infarction    FAMILY HISTORY: Diabetes - Mother Prostate Cancer - Brother   SOCIAL HISTORY: Marital Status: Divorced Current Smoking Status: Patient smokes. Smokes 1/2 pack per day.  Has never drank.  Does not drink caffeine. Patient's occupation is/was retired.     Notes: 1 son   REVIEW OF SYSTEMS:    GU Review Male:   Patient reports frequent urination, hard to postpone urination, get up at night to urinate, trouble starting your stream, and have to strain to urinate . Patient denies burning/ pain with urination, leakage of urine, stream starts and stops, erection problems, and penile pain.  Gastrointestinal (Upper):   Patient reports indigestion/ heartburn. Patient denies nausea and vomiting.  Gastrointestinal (Lower):   Patient reports  diarrhea. Patient denies constipation.  Constitutional:   The fever is hot in the knee. Patient reports fever and fatigue. Patient denies night sweats and weight loss.  Skin:   Patient denies skin rash/ lesion and itching.  Eyes:   Patient denies blurred vision and double vision.  Ears/ Nose/ Throat:   Patient denies sore throat and sinus problems.  Hematologic/Lymphatic:   Patient denies swollen glands and easy bruising.  Cardiovascular:   right knee with erythema. Patient reports leg swelling. Patient denies chest pains.  Respiratory:   Patient reports cough. Patient denies shortness of breath.  Endocrine:   Patient denies excessive thirst.  Musculoskeletal:   right knee. He has had prior gout. Patient reports back pain and joint pain.   Neurological:   Patient denies headaches and dizziness.  Psychologic:   Patient denies depression and anxiety.   VITAL SIGNS:      01/18/2016 09:39 AM  Weight 145 lb / 65.77 kg  Height 63 in / 160.02 cm  BP 126/78 mmHg  Pulse 65 /min  Temperature 97.8 F / 37 C  BMI 25.7 kg/m   GU PHYSICAL EXAMINATION:    Anus and Perineum: No hemorrhoids. No anal stenosis. No rectal fissure, no anal fissure. No edema, no dimple, no perineal tenderness, no anal tenderness.  Scrotum: No lesions. No edema. No cysts. No warts.  Epididymides: Right: no spermatocele, no masses, no cysts, no tenderness, no induration, no enlargement. Left: no spermatocele,  no masses, no cysts, no tenderness, no induration, no enlargement.  Testes: No tenderness, no swelling, no enlargement left testes. No tenderness, no swelling, no enlargement right testes. Normal location left testes. Normal location right testes. No mass, no cyst, no varicocele, no hydrocele left testes. No mass, no cyst, no varicocele, no hydrocele right testes.  Urethral Meatus: Normal size. No lesion, no wart, no discharge, no polyp. Normal location.  Penis: Circumcised, no warts, no cracks. No dorsal Peyronie's  plaques, no left corporal Peyronie's plaques, no right corporal Peyronie's plaques, no scarring, no warts. No balanitis, no meatal stenosis.  Prostate: Prostate 2 + size. Left lobe normal consistency, right lobe normal consistency. Symmetrical lobes. No prostate nodule. Left lobe no tenderness, right lobe no tenderness.   Seminal Vesicles: Nonpalpable.  Sphincter Tone: Normal sphincter. No rectal tenderness. No rectal mass.    MULTI-SYSTEM PHYSICAL EXAMINATION:    Constitutional: Well-nourished. No physical deformities. Normally developed. Good grooming.  Neck: Neck symmetrical, not swollen. Normal tracheal position.  Respiratory: No labored breathing, no use of accessory muscles. CTA  Cardiovascular: Normal temperature, RRR without murmur, no swelling, no varicosities.   Lymphatic: No enlargement of neck, axillae, groin.  Skin: No paleness, no jaundice, no cyanosis. No lesion, no ulcer, no rash.  Neurologic / Psychiatric: Oriented to time, oriented to place, oriented to person. No depression, no anxiety, no agitation.  Gastrointestinal: No hernia. No mass, no tenderness, no rigidity, non obese abdomen.   Musculoskeletal: Normal gait and station of head and neck. He has a swollen, tender right knee.      PAST DATA REVIEWED:  Source Of History:  Patient  Urine Test Review:   Urinalysis  X-Ray Review: C.T. Abdomen/Pelvis: Reviewed Films. Reviewed Report. 5.2cm bladder mass at trigone. 3.3cm AAA and small gallstones.    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  2   Gross hematuria - R31.0   3 NON-GU:   Gout - M10.9 His right knee is swollen and tender and he is probably having a gout flair.   4   Abdominal aortic aneurysm - I71.4 He has a 3.3cm AAA and will need surveillance for this .   PLAN:            Medications New Meds: Hydrocodone-Acetaminophen 10 mg-325 mg tablet 1 tablet PO Q 6 H PRN   #20  0 Refill(s)            Orders Labs PSA with Reflex, CBC with Diff, CMP, Uric Acid          Schedule Return Visit: ASAP - Schedule Surgery          Document Letter(s):  Created for Patient: Clinical Summary  Notes:   I am going to get him scheduled for cystoscopy, TURBT with possible epirubicin instillation and possible stent insertion. I have reviewed the risks of bleeding, infection, injury to the bladder or ureters, need for secondary procedures, need for stent, chemical cystitis, thrombotic events and anesthetic complications.   He has probable acute gout and I will check labs today. I have suggested he see Dr. Hilma Favors for that but I gave him some pain med to help pending that visit.   He will need f/u of the AAA with an abdominal US in 12 months.

## 2016-01-23 ENCOUNTER — Encounter (HOSPITAL_COMMUNITY): Payer: Self-pay

## 2016-01-23 ENCOUNTER — Observation Stay (HOSPITAL_COMMUNITY)
Admission: RE | Admit: 2016-01-23 | Discharge: 2016-01-24 | Disposition: A | Discharge status: Home / Self Care | Payer: Commercial Managed Care - HMO | Source: Ambulatory Visit | Attending: Urology | Admitting: Urology

## 2016-01-23 ENCOUNTER — Ambulatory Visit (HOSPITAL_COMMUNITY): Payer: Commercial Managed Care - HMO | Admitting: Anesthesiology

## 2016-01-23 ENCOUNTER — Encounter (HOSPITAL_COMMUNITY)
Admission: RE | Disposition: A | Discharge status: Home / Self Care | Payer: Self-pay | Source: Ambulatory Visit | Attending: Urology

## 2016-01-23 DIAGNOSIS — I2510 Atherosclerotic heart disease of native coronary artery without angina pectoris: Secondary | ICD-10-CM | POA: Insufficient documentation

## 2016-01-23 DIAGNOSIS — N138 Other obstructive and reflux uropathy: Secondary | ICD-10-CM | POA: Insufficient documentation

## 2016-01-23 DIAGNOSIS — F17210 Nicotine dependence, cigarettes, uncomplicated: Secondary | ICD-10-CM | POA: Insufficient documentation

## 2016-01-23 DIAGNOSIS — I10 Essential (primary) hypertension: Secondary | ICD-10-CM | POA: Diagnosis not present

## 2016-01-23 DIAGNOSIS — E119 Type 2 diabetes mellitus without complications: Secondary | ICD-10-CM | POA: Diagnosis not present

## 2016-01-23 DIAGNOSIS — I252 Old myocardial infarction: Secondary | ICD-10-CM | POA: Insufficient documentation

## 2016-01-23 DIAGNOSIS — Z833 Family history of diabetes mellitus: Secondary | ICD-10-CM | POA: Insufficient documentation

## 2016-01-23 DIAGNOSIS — Z951 Presence of aortocoronary bypass graft: Secondary | ICD-10-CM | POA: Insufficient documentation

## 2016-01-23 DIAGNOSIS — M1990 Unspecified osteoarthritis, unspecified site: Secondary | ICD-10-CM | POA: Insufficient documentation

## 2016-01-23 DIAGNOSIS — E7800 Pure hypercholesterolemia, unspecified: Secondary | ICD-10-CM | POA: Insufficient documentation

## 2016-01-23 DIAGNOSIS — C670 Malignant neoplasm of trigone of bladder: Principal | ICD-10-CM | POA: Insufficient documentation

## 2016-01-23 DIAGNOSIS — C679 Malignant neoplasm of bladder, unspecified: Secondary | ICD-10-CM | POA: Diagnosis present

## 2016-01-23 DIAGNOSIS — K219 Gastro-esophageal reflux disease without esophagitis: Secondary | ICD-10-CM | POA: Insufficient documentation

## 2016-01-23 DIAGNOSIS — E785 Hyperlipidemia, unspecified: Secondary | ICD-10-CM | POA: Diagnosis not present

## 2016-01-23 DIAGNOSIS — N401 Enlarged prostate with lower urinary tract symptoms: Secondary | ICD-10-CM | POA: Insufficient documentation

## 2016-01-23 DIAGNOSIS — C674 Malignant neoplasm of posterior wall of bladder: Secondary | ICD-10-CM | POA: Diagnosis not present

## 2016-01-23 DIAGNOSIS — M109 Gout, unspecified: Secondary | ICD-10-CM | POA: Insufficient documentation

## 2016-01-23 DIAGNOSIS — I714 Abdominal aortic aneurysm, without rupture: Secondary | ICD-10-CM | POA: Insufficient documentation

## 2016-01-23 DIAGNOSIS — Z7984 Long term (current) use of oral hypoglycemic drugs: Secondary | ICD-10-CM | POA: Insufficient documentation

## 2016-01-23 DIAGNOSIS — D494 Neoplasm of unspecified behavior of bladder: Secondary | ICD-10-CM | POA: Diagnosis not present

## 2016-01-23 DIAGNOSIS — C678 Malignant neoplasm of overlapping sites of bladder: Secondary | ICD-10-CM

## 2016-01-23 DIAGNOSIS — Z8042 Family history of malignant neoplasm of prostate: Secondary | ICD-10-CM | POA: Insufficient documentation

## 2016-01-23 DIAGNOSIS — F329 Major depressive disorder, single episode, unspecified: Secondary | ICD-10-CM | POA: Insufficient documentation

## 2016-01-23 HISTORY — PX: CYSTOSCOPY WITH STENT PLACEMENT: SHX5790

## 2016-01-23 HISTORY — PX: TRANSURETHRAL RESECTION OF BLADDER TUMOR WITH MITOMYCIN-C: SHX6459

## 2016-01-23 LAB — HEMOGLOBIN AND HEMATOCRIT, BLOOD
HCT: 37.4 % — ABNORMAL LOW (ref 39.0–52.0)
Hemoglobin: 12.5 g/dL — ABNORMAL LOW (ref 13.0–17.0)

## 2016-01-23 LAB — HEMOGLOBIN A1C
Hgb A1c MFr Bld: 7.2 % — ABNORMAL HIGH (ref 4.8–5.6)
Mean Plasma Glucose: 160 mg/dL

## 2016-01-23 LAB — GLUCOSE, CAPILLARY
GLUCOSE-CAPILLARY: 165 mg/dL — AB (ref 65–99)
GLUCOSE-CAPILLARY: 161 mg/dL — AB (ref 65–99)
GLUCOSE-CAPILLARY: 95 mg/dL (ref 65–99)
Glucose-Capillary: 162 mg/dL — ABNORMAL HIGH (ref 65–99)

## 2016-01-23 SURGERY — TRANSURETHRAL RESECTION OF BLADDER TUMOR WITH MITOMYCIN-C
Anesthesia: General | Laterality: Right

## 2016-01-23 MED ORDER — HYDROCODONE-ACETAMINOPHEN 10-325 MG PO TABS
1.0000 | ORAL_TABLET | Freq: Four times a day (QID) | ORAL | Status: DC | PRN
  Administered 2016-01-23 – 2016-01-24 (×2): 1 via ORAL
  Filled 2016-01-23 (×2): qty 1

## 2016-01-23 MED ORDER — HYDROMORPHONE HCL 1 MG/ML IJ SOLN
0.2500 mg | INTRAMUSCULAR | Status: DC | PRN
  Administered 2016-01-23: 0.5 mg via INTRAVENOUS

## 2016-01-23 MED ORDER — INSULIN ASPART 100 UNIT/ML ~~LOC~~ SOLN
0.0000 [IU] | Freq: Three times a day (TID) | SUBCUTANEOUS | Status: DC
  Administered 2016-01-23: 3 [IU] via SUBCUTANEOUS
  Administered 2016-01-24: 5 [IU] via SUBCUTANEOUS

## 2016-01-23 MED ORDER — HYDROMORPHONE HCL 1 MG/ML IJ SOLN
INTRAMUSCULAR | Status: AC
Start: 2016-01-23 — End: 2016-01-24
  Filled 2016-01-23: qty 1

## 2016-01-23 MED ORDER — HYDROMORPHONE HCL 1 MG/ML IJ SOLN
0.5000 mg | INTRAMUSCULAR | Status: DC | PRN
  Administered 2016-01-23 (×3): 1 mg via INTRAVENOUS
  Filled 2016-01-23 (×3): qty 1

## 2016-01-23 MED ORDER — CEFAZOLIN IN D5W 1 GM/50ML IV SOLN
1.0000 g | Freq: Three times a day (TID) | INTRAVENOUS | Status: DC
  Administered 2016-01-23 – 2016-01-24 (×2): 1 g via INTRAVENOUS
  Filled 2016-01-23 (×5): qty 50

## 2016-01-23 MED ORDER — ROCURONIUM BROMIDE 50 MG/5ML IV SOSY
PREFILLED_SYRINGE | INTRAVENOUS | Status: AC
  Filled 2016-01-23: qty 5

## 2016-01-23 MED ORDER — MIDAZOLAM HCL 2 MG/2ML IJ SOLN
INTRAMUSCULAR | Status: AC
  Filled 2016-01-23: qty 2

## 2016-01-23 MED ORDER — CEFAZOLIN SODIUM-DEXTROSE 2-4 GM/100ML-% IV SOLN
2.0000 g | INTRAVENOUS | Status: AC
  Administered 2016-01-23: 2 g via INTRAVENOUS

## 2016-01-23 MED ORDER — LIDOCAINE 2% (20 MG/ML) 5 ML SYRINGE
INTRAMUSCULAR | Status: AC
Start: 2016-01-23 — End: 2016-01-23
  Filled 2016-01-23: qty 5

## 2016-01-23 MED ORDER — FENTANYL CITRATE (PF) 100 MCG/2ML IJ SOLN
INTRAMUSCULAR | Status: DC | PRN
  Administered 2016-01-23 (×6): 50 ug via INTRAVENOUS

## 2016-01-23 MED ORDER — DIPHENHYDRAMINE HCL 12.5 MG/5ML PO ELIX: 12.5000 mg | ORAL_SOLUTION | Freq: Four times a day (QID) | ORAL | Status: DC | PRN

## 2016-01-23 MED ORDER — ONDANSETRON HCL 4 MG/2ML IJ SOLN: 4.0000 mg | INTRAMUSCULAR | Status: DC | PRN

## 2016-01-23 MED ORDER — ONDANSETRON HCL 4 MG/2ML IJ SOLN
INTRAMUSCULAR | Status: AC
  Filled 2016-01-23: qty 2

## 2016-01-23 MED ORDER — FENTANYL CITRATE (PF) 100 MCG/2ML IJ SOLN
INTRAMUSCULAR | Status: AC
  Filled 2016-01-23: qty 2

## 2016-01-23 MED ORDER — ROCURONIUM BROMIDE 10 MG/ML (PF) SYRINGE
PREFILLED_SYRINGE | INTRAVENOUS | Status: DC | PRN
Start: 2016-01-23 — End: 2016-01-23
  Administered 2016-01-23: 40 mg via INTRAVENOUS
  Administered 2016-01-23: 5 mg via INTRAVENOUS
  Administered 2016-01-23: 10 mg via INTRAVENOUS

## 2016-01-23 MED ORDER — BISACODYL 10 MG RE SUPP: 10.0000 mg | Freq: Every day | RECTAL | Status: DC | PRN

## 2016-01-23 MED ORDER — METOPROLOL TARTRATE 50 MG PO TABS
50.0000 mg | ORAL_TABLET | Freq: Two times a day (BID) | ORAL | Status: DC
  Administered 2016-01-23 – 2016-01-24 (×2): 50 mg via ORAL
  Filled 2016-01-23 (×2): qty 1

## 2016-01-23 MED ORDER — SUGAMMADEX SODIUM 200 MG/2ML IV SOLN
INTRAVENOUS | Status: DC | PRN
  Administered 2016-01-23: 130 mg via INTRAVENOUS

## 2016-01-23 MED ORDER — PROPOFOL 10 MG/ML IV BOLUS
INTRAVENOUS | Status: AC
  Filled 2016-01-23: qty 20

## 2016-01-23 MED ORDER — KETOROLAC TROMETHAMINE 30 MG/ML IJ SOLN: 30.0000 mg | Freq: Once | INTRAMUSCULAR | Status: DC | PRN

## 2016-01-23 MED ORDER — CEFAZOLIN SODIUM-DEXTROSE 2-4 GM/100ML-% IV SOLN
INTRAVENOUS | Status: AC
  Filled 2016-01-23: qty 100

## 2016-01-23 MED ORDER — AMLODIPINE BESYLATE 10 MG PO TABS
10.0000 mg | ORAL_TABLET | Freq: Every morning | ORAL | Status: DC
  Administered 2016-01-24: 10 mg via ORAL
  Filled 2016-01-23: qty 1

## 2016-01-23 MED ORDER — EPHEDRINE SULFATE 50 MG/ML IJ SOLN
INTRAMUSCULAR | Status: DC | PRN
  Administered 2016-01-23: 5 mg via INTRAVENOUS

## 2016-01-23 MED ORDER — ONDANSETRON HCL 4 MG/2ML IJ SOLN
INTRAMUSCULAR | Status: DC | PRN
  Administered 2016-01-23: 4 mg via INTRAVENOUS

## 2016-01-23 MED ORDER — SUCCINYLCHOLINE CHLORIDE 200 MG/10ML IV SOSY
PREFILLED_SYRINGE | INTRAVENOUS | Status: DC | PRN
  Administered 2016-01-23: 120 mg via INTRAVENOUS

## 2016-01-23 MED ORDER — MAGNESIUM HYDROXIDE 400 MG/5ML PO SUSP: 30.0000 mL | Freq: Every day | ORAL | Status: DC | PRN

## 2016-01-23 MED ORDER — PROMETHAZINE HCL 25 MG/ML IJ SOLN: 6.2500 mg | INTRAMUSCULAR | Status: DC | PRN

## 2016-01-23 MED ORDER — SUGAMMADEX SODIUM 200 MG/2ML IV SOLN
INTRAVENOUS | Status: AC
  Filled 2016-01-23: qty 2

## 2016-01-23 MED ORDER — ACETAMINOPHEN 325 MG PO TABS: 650.0000 mg | ORAL_TABLET | ORAL | Status: DC | PRN

## 2016-01-23 MED ORDER — LISINOPRIL 10 MG PO TABS
10.0000 mg | ORAL_TABLET | Freq: Every morning | ORAL | Status: DC
  Administered 2016-01-23 – 2016-01-24 (×2): 10 mg via ORAL
  Filled 2016-01-23 (×2): qty 1

## 2016-01-23 MED ORDER — METFORMIN HCL 500 MG PO TABS
1000.0000 mg | ORAL_TABLET | Freq: Two times a day (BID) | ORAL | Status: DC
  Administered 2016-01-23 – 2016-01-24 (×2): 1000 mg via ORAL
  Filled 2016-01-23 (×2): qty 2

## 2016-01-23 MED ORDER — EPHEDRINE 5 MG/ML INJ
INTRAVENOUS | Status: AC
Start: 2016-01-23 — End: 2016-01-23
  Filled 2016-01-23: qty 10

## 2016-01-23 MED ORDER — SUCCINYLCHOLINE CHLORIDE 200 MG/10ML IV SOSY
PREFILLED_SYRINGE | INTRAVENOUS | Status: AC
  Filled 2016-01-23: qty 10

## 2016-01-23 MED ORDER — MIDAZOLAM HCL 5 MG/5ML IJ SOLN
INTRAMUSCULAR | Status: DC | PRN
  Administered 2016-01-23: 2 mg via INTRAVENOUS

## 2016-01-23 MED ORDER — POTASSIUM CHLORIDE IN NACL 20-0.45 MEQ/L-% IV SOLN
INTRAVENOUS | Status: DC
  Administered 2016-01-23 – 2016-01-24 (×2): via INTRAVENOUS
  Filled 2016-01-23 (×3): qty 1000

## 2016-01-23 MED ORDER — DIPHENHYDRAMINE HCL 50 MG/ML IJ SOLN: 12.5000 mg | Freq: Four times a day (QID) | INTRAMUSCULAR | Status: DC | PRN

## 2016-01-23 MED ORDER — SIMVASTATIN 20 MG PO TABS
20.0000 mg | ORAL_TABLET | Freq: Every morning | ORAL | Status: DC
  Administered 2016-01-23 – 2016-01-24 (×2): 20 mg via ORAL
  Filled 2016-01-23 (×2): qty 1

## 2016-01-23 MED ORDER — FLEET ENEMA 7-19 GM/118ML RE ENEM: 1.0000 | ENEMA | Freq: Once | RECTAL | Status: DC | PRN

## 2016-01-23 MED ORDER — LACTATED RINGERS IV SOLN
INTRAVENOUS | Status: DC
  Administered 2016-01-23: 09:00:00 via INTRAVENOUS

## 2016-01-23 MED ORDER — ZOLPIDEM TARTRATE 5 MG PO TABS: 5.0000 mg | ORAL_TABLET | Freq: Every evening | ORAL | Status: DC | PRN

## 2016-01-23 MED ORDER — PROPOFOL 10 MG/ML IV BOLUS
INTRAVENOUS | Status: DC | PRN
Start: 2016-01-23 — End: 2016-01-23
  Administered 2016-01-23: 140 mg via INTRAVENOUS

## 2016-01-23 MED ORDER — SODIUM CHLORIDE 0.9 % IR SOLN
Status: DC | PRN
  Administered 2016-01-23: 27000 mL

## 2016-01-23 MED ORDER — SODIUM CHLORIDE 0.9 % IV SOLN
50.0000 mg | Freq: Once | INTRAVENOUS | Status: DC
  Filled 2016-01-23: qty 25

## 2016-01-23 MED ORDER — DOCUSATE SODIUM 100 MG PO CAPS
100.0000 mg | ORAL_CAPSULE | Freq: Two times a day (BID) | ORAL | Status: DC
  Administered 2016-01-23 – 2016-01-24 (×2): 100 mg via ORAL
  Filled 2016-01-23 (×2): qty 1

## 2016-01-23 SURGICAL SUPPLY — 15 items
BAG URINE DRAINAGE (UROLOGICAL SUPPLIES) ×4 IMPLANT
BAG URO CATCHER STRL LF (MISCELLANEOUS) ×4 IMPLANT
CATH HEMA 3WAY 30CC 24FR COUDE (CATHETERS) ×3 IMPLANT
CATH URET 5FR 28IN OPEN ENDED (CATHETERS) IMPLANT
CLOTH BEACON ORANGE TIMEOUT ST (SAFETY) ×4 IMPLANT
GLOVE SURG SS PI 8.0 STRL IVOR (GLOVE) ×2 IMPLANT
GOWN STRL REUS W/TWL XL LVL3 (GOWN DISPOSABLE) ×4 IMPLANT
GUIDEWIRE STR DUAL SENSOR (WIRE) ×4 IMPLANT
LOOP CUT BIPOLAR 24F LRG (ELECTROSURGICAL) ×2 IMPLANT
MANIFOLD NEPTUNE II (INSTRUMENTS) ×4 IMPLANT
PACK CYSTO (CUSTOM PROCEDURE TRAY) ×4 IMPLANT
STENT CONTOUR 6FRX24X.038 (STENTS) ×4 IMPLANT
SYRINGE IRR TOOMEY STRL 70CC (SYRINGE) ×3 IMPLANT
TUBING CONNECTING 10 (TUBING) ×3 IMPLANT
TUBING CONNECTING 10' (TUBING) ×1

## 2016-01-23 NOTE — Progress Notes (Signed)
Urology Progress Note  Day of Surgery #0  Subjective: The patient is doing well.  No nausea or vomiting. No pain or discomfort.  Objective: Vital signs in last 24 hours: Temp:  [97.5 F (36.4 C)-98.7 F (37.1 C)] 98.7 F (37.1 C) (12/19 1356) Pulse Rate:  [56-68] 66 (12/19 1356) Resp:  [10-20] 16 (12/19 1356) BP: (142-173)/(71-86) 159/75 (12/19 1356) SpO2:  [96 %-100 %] 98 % (12/19 1356) Weight:  [64.9 kg (143 lb)] 64.9 kg (143 lb) (12/19 0812)  Intake/Output from previous day: No intake/output data recorded. Intake/Output this shift: Total I/O In: 1643.3 [P.O.:120; I.V.:1523.3] Out: 150 [Urine:150]  Physical Exam:  General: Alert and oriented. CV: RRR Lungs: No increased work of breathing GI: Soft, Nondistended Urine: Foley in place, clear urine Extremities: Nontender, no erythema, no edema.  Lab Results:  Recent Labs  01/22/16 1353 01/23/16 1244  HGB 13.0 12.5*  HCT 38.0* 37.4*      Assessment/Plan: POD#0 s/p TURBT  Continue current management plan. Foley, diet as tolerated, AM labs. Anticipate d/c tomorrow.  Discussed with Dr. Jeffie Pollock.   LOS: 0 days   Jacalynn Buzzell R 01/23/2016, 4:20 PM

## 2016-01-23 NOTE — Anesthesia Preprocedure Evaluation (Addendum)
Anesthesia Evaluation  Patient identified by MRN, date of birth, ID band Patient awake    Reviewed: Allergy & Precautions, NPO status , Patient's Chart, lab work & pertinent test results  Airway Mallampati: II  TM Distance: >3 FB Neck ROM: Full    Dental  (+) Poor Dentition, Missing   Pulmonary Current Smoker,    breath sounds clear to auscultation+ rhonchi        Cardiovascular hypertension, + CAD and + CABG  Normal cardiovascular exam Rhythm:Regular Rate:Normal     Neuro/Psych negative neurological ROS  negative psych ROS   GI/Hepatic negative GI ROS, Neg liver ROS,   Endo/Other  diabetes  Renal/GU negative Renal ROS  negative genitourinary   Musculoskeletal negative musculoskeletal ROS (+)   Abdominal   Peds negative pediatric ROS (+)  Hematology negative hematology ROS (+)   Anesthesia Other Findings   Reproductive/Obstetrics negative OB ROS                            Anesthesia Physical Anesthesia Plan  ASA: III  Anesthesia Plan: General   Post-op Pain Management:    Induction: Intravenous  Airway Management Planned: Oral ETT  Additional Equipment:   Intra-op Plan:   Post-operative Plan: Extubation in OR  Informed Consent: I have reviewed the patients History and Physical, chart, labs and discussed the procedure including the risks, benefits and alternatives for the proposed anesthesia with the patient or authorized representative who has indicated his/her understanding and acceptance.   Dental advisory given  Plan Discussed with: CRNA and Surgeon  Anesthesia Plan Comments:         Anesthesia Quick Evaluation

## 2016-01-23 NOTE — Discharge Instructions (Signed)
CYSTOSCOPY HOME CARE INSTRUCTIONS  Activity: Rest for the remainder of the day.  Do not drive or operate equipment today.  You may resume normal activities in one to two days as instructed by your physician.   Meals: Drink plenty of liquids and eat light foods such as gelatin or soup this evening.  You may return to a normal meal plan tomorrow.  Return to Work: You may return to work in one to two days or as instructed by your physician.  Special Instructions / Symptoms: Call your physician if any of these symptoms occur:   -persistent or heavy bleeding  -bleeding which continues after first few urination  -large blood clots that are difficult to pass  -urine stream diminishes or stops completely  -fever equal to or higher than 101 degrees Farenheit.  -cloudy urine with a strong, foul odor  -severe pain  Females should always wipe from front to back after elimination.  You may feel some burning pain when you urinate.  This should disappear with time.  Applying moist heat to the lower abdomen or a hot tub bath may help relieve the pain. \     Ureteral Stent Implantation, Care After Introduction Refer to this sheet in the next few weeks. These instructions provide you with information about caring for yourself after your procedure. Your health care provider may also give you more specific instructions. Your treatment has been planned according to current medical practices, but problems sometimes occur. Call your health care provider if you have any problems or questions after your procedure. What can I expect after the procedure? After the procedure, it is common to have:  Nausea.  Mild pain when you urinate. You may feel this pain in your lower back or lower abdomen. Pain should stop within a few minutes after you urinate. This may last for up to 1 week.  A small amount of blood in your urine for several days. Follow these instructions at home:   Medicines  Take  over-the-counter and prescription medicines only as told by your health care provider.  If you were prescribed an antibiotic medicine, take it as told by your health care provider. Do not stop taking the antibiotic even if you start to feel better.  Do not drive for 24 hours if you received a sedative.  Do not drive or operate heavy machinery while taking prescription pain medicines. Activity  Return to your normal activities as told by your health care provider. Ask your health care provider what activities are safe for you.  Do not lift anything that is heavier than 10 lb (4.5 kg). Follow this limit for 1 week after your procedure, or for as long as told by your health care provider. General instructions  Watch for any blood in your urine. Call your health care provider if the amount of blood in your urine increases.  If you have a catheter:  Follow instructions from your health care provider about taking care of your catheter and collection bag.  Do not take baths, swim, or use a hot tub until your health care provider approves.  Drink enough fluid to keep your urine clear or pale yellow.  Keep all follow-up visits as told by your health care provider. This is important. Contact a health care provider if:  You have pain that gets worse or does not get better with medicine, especially pain when you urinate.  You have difficulty urinating.  You feel nauseous or you vomit repeatedly during a  period of more than 2 days after the procedure. Get help right away if:  Your urine is dark red or has blood clots in it.  You are leaking urine (have incontinence).  The end of the stent comes out of your urethra.  You cannot urinate.  You have sudden, sharp, or severe pain in your abdomen or lower back.  You have a fever. This information is not intended to replace advice given to you by your health care provider. Make sure you discuss any questions you have with your health care  provider. Document Released: 09/23/2012 Document Revised: 06/29/2015 Document Reviewed: 08/05/2014  2017 Elsevier

## 2016-01-23 NOTE — Anesthesia Preprocedure Evaluation (Signed)
Anesthesia Evaluation  Patient identified by MRN, date of birth, ID band Patient awake    Reviewed: Allergy & Precautions, H&P , Patient's Chart, lab work & pertinent test results, reviewed documented beta blocker date and time   Airway Mallampati: II  TM Distance: >3 FB Neck ROM: full    Dental no notable dental hx.    Pulmonary Current Smoker,    Pulmonary exam normal breath sounds clear to auscultation       Cardiovascular hypertension,  Rhythm:regular Rate:Normal     Neuro/Psych    GI/Hepatic   Endo/Other  diabetes  Renal/GU      Musculoskeletal   Abdominal   Peds  Hematology   Anesthesia Other Findings HTN DM  Reproductive/Obstetrics                             Anesthesia Physical Anesthesia Plan  ASA: II  Anesthesia Plan: MAC   Post-op Pain Management:    Induction: Intravenous  Airway Management Planned: Mask and Natural Airway  Additional Equipment:   Intra-op Plan:   Post-operative Plan:   Informed Consent: I have reviewed the patients History and Physical, chart, labs and discussed the procedure including the risks, benefits and alternatives for the proposed anesthesia with the patient or authorized representative who has indicated his/her understanding and acceptance.   Dental Advisory Given  Plan Discussed with: CRNA and Surgeon  Anesthesia Plan Comments: (Discussed sedation and potential to need to place airway or ETT if warranted by clinical changes intra-operatively. We will start procedure as MAC.)        Anesthesia Quick Evaluation

## 2016-01-23 NOTE — Brief Op Note (Signed)
01/23/2016  12:08 PM  PATIENT:  Kenneth Kelly  65 y.o. male  PRE-OPERATIVE DIAGNOSIS:  BLADDER TUMOR >5cm Overlapping sites.  POST-OPERATIVE DIAGNOSIS:  BLADDER TUMOR >5cm Overlapping sites.  PROCEDURE:  Procedure(s): CYSTOSCOPY ,TRANSURETHRAL RESECTION OF BLADDER TUMOR (>5cm) CYSTOSCOPY WITH STENT PLACEMENT (Right)  SURGEON:  Surgeon(s) and Role:    * [name], MD - Primary  PHYSICIAN ASSISTANT:   ASSISTANTS: none   ANESTHESIA:   general  EBL:  No intake/output data recorded.  BLOOD ADMINISTERED:none  DRAINS: Urinary Catheter (Foley) and 6 x 24 right stent   LOCAL MEDICATIONS USED:  NONE  SPECIMEN:  Source of Specimen:  bladder tumor chips  DISPOSITION OF SPECIMEN:  PATHOLOGY  COUNTS:  YES  TOURNIQUET:  * No tourniquets in log *  DICTATION: .Other Dictation: Dictation Number [id number]  PLAN OF CARE: Admit for overnight observation  PATIENT DISPOSITION:  PACU - hemodynamically stable.   Delay start of Pharmacological VTE agent (>24hrs) due to surgical blood loss or risk of bleeding: yes

## 2016-01-23 NOTE — Anesthesia Postprocedure Evaluation (Signed)
Anesthesia Post Note  Patient: Kenneth Kelly  Procedure(s) Performed: Procedure(s) (LRB): CYSTOSCOPY ,TRANSURETHRAL RESECTION OF BLADDER TUMOR (Bilateral) CYSTOSCOPY WITH STENT PLACEMENT (Right)  Patient location during evaluation: PACU Anesthesia Type: General Level of consciousness: awake and alert Pain management: pain level controlled Vital Signs Assessment: post-procedure vital signs reviewed and stable Respiratory status: spontaneous breathing, nonlabored ventilation, respiratory function stable and patient connected to nasal cannula oxygen Cardiovascular status: blood pressure returned to baseline and stable Postop Assessment: no signs of nausea or vomiting Anesthetic complications: no       Last Vitals:  Vitals:   01/23/16 1227 01/23/16 1230  BP: (!) 161/81 (!) 152/85  Pulse: 68 65  Resp: 16 16  Temp: 36.4 C     Last Pain:  Vitals:   01/23/16 1227  TempSrc:   PainSc: 0-No pain                 [name] S

## 2016-01-23 NOTE — Interval H&P Note (Signed)
History and Physical Interval Note:  01/23/2016 10:24 AM  Kenneth Kelly  has presented today for surgery, with the diagnosis of BLADDER TUMOR  The various methods of treatment have been discussed with the patient and family. After consideration of risks, benefits and other options for treatment, the patient has consented to  Procedure(s): CYSTO TRANSURETHRAL RESECTION OF BLADDER TUMOR WITH POSSIBLE STENT POSSIBLE EPIRUBICIN (Bilateral) CYSTOSCOPY WITH STENT PLACEMENT (Bilateral) as a surgical intervention .  The patient's history has been reviewed, patient examined, no change in status, stable for surgery.  I have reviewed the patient's chart and labs.  Questions were answered to the patient's satisfaction.     Iridiana Fonner J

## 2016-01-23 NOTE — Anesthesia Procedure Notes (Signed)
Procedure Name: Intubation Date/Time: 01/23/2016 10:35 AM Performed by: Anne Fu Pre-anesthesia Checklist: Patient identified, Emergency Drugs available, Suction available, Patient being monitored and Timeout performed Patient Re-evaluated:Patient Re-evaluated prior to inductionOxygen Delivery Method: Circle system utilized Preoxygenation: Pre-oxygenation with 100% oxygen Intubation Type: IV induction Ventilation: Mask ventilation without difficulty Laryngoscope Size: Miller and 3 Grade View: Grade II Tube type: Oral Tube size: 7.5 mm Number of attempts: 2 Airway Equipment and Method: Stylet Placement Confirmation: ETT inserted through vocal cords under direct vision,  positive ETCO2,  CO2 detector and breath sounds checked- equal and bilateral Secured at: 21 cm Tube secured with: Tape Dental Injury: Teeth and Oropharynx as per pre-operative assessment  Comments: First attempt with MAC 4 with noted anterior airway, converted to Mil 2 per MDA noted Grade II view BBS post intubation.

## 2016-01-23 NOTE — Transfer of Care (Signed)
Immediate Anesthesia Transfer of Care Note  Patient: Kenneth Kelly  Procedure(s) Performed: Procedure(s): CYSTOSCOPY ,TRANSURETHRAL RESECTION OF BLADDER TUMOR (Bilateral) CYSTOSCOPY WITH STENT PLACEMENT (Right)  Patient Location: PACU  Anesthesia Type:General  Level of Consciousness:  sedated, patient cooperative and responds to stimulation  Airway & Oxygen Therapy:Patient Spontanous Breathing and Patient connected to face mask oxgen  Post-op Assessment:  Report given to PACU RN and Post -op Vital signs reviewed and stable  Post vital signs:  Reviewed and stable  Last Vitals:  Vitals:   01/23/16 0802  BP: (!) 142/71  Pulse: (!) 56  Resp: 16  Temp: [id number] C    Complications: No apparent anesthesia complications

## 2016-01-24 ENCOUNTER — Encounter (HOSPITAL_COMMUNITY): Payer: Self-pay | Admitting: Urology

## 2016-01-24 DIAGNOSIS — E7800 Pure hypercholesterolemia, unspecified: Secondary | ICD-10-CM | POA: Diagnosis not present

## 2016-01-24 DIAGNOSIS — E119 Type 2 diabetes mellitus without complications: Secondary | ICD-10-CM | POA: Diagnosis not present

## 2016-01-24 DIAGNOSIS — I2510 Atherosclerotic heart disease of native coronary artery without angina pectoris: Secondary | ICD-10-CM | POA: Diagnosis not present

## 2016-01-24 DIAGNOSIS — I10 Essential (primary) hypertension: Secondary | ICD-10-CM | POA: Diagnosis not present

## 2016-01-24 DIAGNOSIS — C670 Malignant neoplasm of trigone of bladder: Secondary | ICD-10-CM | POA: Diagnosis not present

## 2016-01-24 DIAGNOSIS — M109 Gout, unspecified: Secondary | ICD-10-CM | POA: Diagnosis not present

## 2016-01-24 DIAGNOSIS — K219 Gastro-esophageal reflux disease without esophagitis: Secondary | ICD-10-CM | POA: Diagnosis not present

## 2016-01-24 DIAGNOSIS — N138 Other obstructive and reflux uropathy: Secondary | ICD-10-CM | POA: Diagnosis not present

## 2016-01-24 DIAGNOSIS — N401 Enlarged prostate with lower urinary tract symptoms: Secondary | ICD-10-CM | POA: Diagnosis not present

## 2016-01-24 LAB — BASIC METABOLIC PANEL
ANION GAP: 6 (ref 5–15)
BUN: 14 mg/dL (ref 6–20)
CALCIUM: 8.4 mg/dL — AB (ref 8.9–10.3)
CO2: 29 mmol/L (ref 22–32)
CREATININE: 0.84 mg/dL (ref 0.61–1.24)
Chloride: 100 mmol/L — ABNORMAL LOW (ref 101–111)
Glucose, Bld: 207 mg/dL — ABNORMAL HIGH (ref 65–99)
Potassium: 4.4 mmol/L (ref 3.5–5.1)
SODIUM: 135 mmol/L (ref 135–145)

## 2016-01-24 LAB — GLUCOSE, CAPILLARY: GLUCOSE-CAPILLARY: 232 mg/dL — AB (ref 65–99)

## 2016-01-24 LAB — HEMOGLOBIN AND HEMATOCRIT, BLOOD
HEMATOCRIT: 34.6 % — AB (ref 39.0–52.0)
HEMOGLOBIN: 11.7 g/dL — AB (ref 13.0–17.0)

## 2016-01-24 MED ORDER — HYDROCODONE-ACETAMINOPHEN 10-325 MG PO TABS: 1.0000 | ORAL_TABLET | Freq: Four times a day (QID) | ORAL | 0 refills | Status: DC | PRN

## 2016-01-24 NOTE — Op Note (Signed)
[name], [name] NO.:  [id number]  MEDICAL RECORD NO.:  [id number]  LOCATION:                                 FACILITY:  PHYSICIAN:  Marshall Cork. Jeffie Pollock, M.D.    DATE OF BIRTH:  December 20, 1950  DATE OF PROCEDURE:  01/23/2016 DATE OF DISCHARGE:                              OPERATIVE REPORT   PROCEDURES: 1. Cystoscopy with transurethral resection of greater than 5 cm     bladder tumor. 2. Cystoscopy with insertion of a right double-J stent.  PREOPERATIVE DIAGNOSIS:  Large greater than 5 cm bladder tumor at the base of the bladder.  POSTOPERATIVE DIAGNOSIS:  Large greater than 5 cm bladder tumor at the base of the bladder with overlapping site of tumor.  SURGEON:  [name], MD  ANESTHESIA:  General.  SPECIMEN:  Tumor chips.  DRAINS:  6-French x 24 cm right double-J stent and 24-French 3-way hematuria catheter.  BLOOD LOSS:  Approximately 200 mL.  COMPLICATIONS:  None.  INDICATIONS:  Mr. Revette is a 65 year old white male, who was evaluated for hematuria and was found to have a large tumor at the base of the bladder.  It was felt that cystoscopy with transurethral resection of the tumor was indicated along with possible retrograde and stenting as needed and if amenable installation of epirubicin.  FINDINGS OF PROCEDURE:  He was taken to the operating room where he was given Ancef.  General anesthetic was induced.  He was placed in lithotomy position.  He was fitted with PAS hose.  His perineum and genitalia were prepped with Betadine solution.  He was draped in usual sterile fashion.  A paralytic agent was given to help avoid obturator reflex.  Cystoscopy was performed using a 23-French scope and 30-degree lens. Examination revealed a normal urethra.  The external sphincter was intact.  The prostatic urethra was short with bilobar hyperplasia with mild obstruction.  Examination of bladder revealed a large tumor beginning in the trigone.  The right  ureteral orifice was not identified.  The left ureteral orifice was in the normal anatomic position away from the base of the tumor.  The tumor extended up onto the posterior wall of the bladder and by CT had a diameter of greater than 5 cm.  After cystoscopy was performed, the 28-French continuous flow resectoscope sheath was inserted with the aid of the visual obturator. The obturator was replaced with an Beatrix Fetters handle with a 30-degree lens and bipolar loop.  Saline was used for the irrigant.  Resection was initiated on the left side of the tumor gradually.  The tumor was resected in its entirety down to the muscular fibers and fat and a couple of locations.  It was a very extensive prolonged resection with fairly significant bleeding throughout.  However, once the tumor had been fully resected, inspection revealed no bladder wall perforation and no active bleeding.  Some minor residual fronds were on the margin of the tumor generously fulgurated and final inspection revealed no residual tumor material.  It was difficult to say based on the resection whether this is invasive or superficial tumor, although I favor based on the depth resection noted superficial.  A final inspection also demonstrated the left ureteral orifice.  It was right at the margin of the resection, the area of the right trigone, and there was some cautery injury around the orifice.  As a result of this, it was felt that stenting was indicated.  I had previously evacuated all of the chips.  The resectoscope was removed and was replaced with a 23-French cystoscope with 30-degree lens.  A 5-French open-end catheter was used to stiff and a sensor guidewire, which was then passed up to the ureteral orifice to the kidney under fluoroscopic guidance.  The open- end catheter was removed and a 6-French 24 cm contour double-J stent was inserted without difficulty to the kidney.  The wire was removed leaving good  coil in the kidney and good coil in the bladder.  Final inspection revealed no active bleeding, no retained tumor material or resection chips.  The scope was removed and a 24-French 3-way hematuria catheter was placed.  I felt that was indicated because of the large resection defect, the greater than usual probability of postoperative bleeding.  Once the catheter was in place, the balloon was filled with 30 mL of sterile fluid.  The irrigation port was plugged.  The catheter was irrigated with clear return and placed to continuous irrigation.  The patient was taken down from lithotomy position.  His anesthetic was reversed.  He was moved to recovery room in stable condition.  I felt that the epirubicin was not indicated at this time because the need for stent and also the size of the tumor will mandate the need for BCG if it is proven to be noninvasive.  There were no complications.     Marshall Cork. Jeffie Pollock, M.D.   ______________________________ Marshall Cork. Jeffie Pollock, M.D.    JJW/MEDQ  D:  01/23/2016  T:  01/24/2016  Job:  [id number]

## 2016-01-25 NOTE — Discharge Summary (Signed)
Physician Discharge Summary  Patient ID: Kenneth Kelly MRN: [id number] DOB/AGE: 1950-02-18 65 y.o.  Admit date: 01/23/2016 Discharge date: 01/25/2016  Admission Diagnoses:  Bladder cancer [facility])  Discharge Diagnoses:  Principal Problem:   Bladder cancer [facility])   Past Medical History:  Diagnosis Date  . Bladder tumor   . CAD (coronary artery disease)   . Diabetes mellitus without complication ([place])   . Gout   . Hypertension   . Tobacco abuse     Surgeries: Procedure(s): CYSTOSCOPY ,TRANSURETHRAL RESECTION OF BLADDER TUMOR CYSTOSCOPY WITH STENT PLACEMENT on 01/23/2016   Consultants (if any):   Discharged Condition: Improved  Hospital Course: Kenneth Kelly is an 65 y.o. male who was admitted 01/23/2016 with a diagnosis of Bladder cancer ([place]) and went to the operating room on 01/23/2016 and underwent the above named procedures.  He has a >5cm tumor of the bladder base and required a stent for resection near the right UO.  His urine is clear this AM and the foley was removed.   He will be discharged when voiding.   He was given perioperative antibiotics:  Anti-infectives    Start     Dose/Rate Route Frequency Ordered Stop   01/23/16 1800  ceFAZolin (ANCEF) IVPB 1 g/50 mL premix  Status:  Discontinued     1 g 100 mL/hr over 30 Minutes Intravenous Every 8 hours 01/23/16 1400 01/24/16 1536   01/23/16 0755  ceFAZolin (ANCEF) IVPB 2g/100 mL premix     2 g 200 mL/hr over 30 Minutes Intravenous 30 min pre-op 01/23/16 0755 01/23/16 1042    .  He was given sequential compression devices for DVT prophylaxis.  He benefited maximally from the hospital stay and there were no complications.    Recent vital signs:  Vitals:   01/23/16 2139 01/24/16 0320  BP:  (!) 158/73  Pulse: 67 (!) 58  Resp:  19  Temp:  97.9 F (36.6 C)    Recent laboratory studies:  Lab Results  Component Value Date   HGB 11.7 (L) 01/24/2016   HGB 12.5 (L) 01/23/2016   HGB 13.0 01/22/2016   Lab  Results  Component Value Date   WBC 7.6 01/22/2016   PLT 227 01/22/2016   No results found for: INR Lab Results  Component Value Date   NA 135 01/24/2016   K 4.4 01/24/2016   CL 100 (L) 01/24/2016   CO2 29 01/24/2016   BUN 14 01/24/2016   CREATININE 0.84 01/24/2016   GLUCOSE 207 (H) 01/24/2016    Discharge Medications:   Allergies as of 01/24/2016   No Known Allergies     Medication List    TAKE these medications   acetaminophen 500 MG tablet Commonly known as:  TYLENOL Take 1,000-1,500 mg by mouth every 6 (six) hours as needed (Pain).   amLODipine 10 MG tablet Commonly known as:  NORVASC Take 10 mg by mouth every morning.   HYDROcodone-acetaminophen 10-325 MG tablet Commonly known as:  NORCO Take 1 tablet by mouth every 6 (six) hours as needed for moderate pain (Gout Pain). What changed:  reasons to take this   ibuprofen 200 MG tablet Commonly known as:  ADVIL,MOTRIN Take 400-600 mg by mouth every 6 (six) hours as needed (pain).   lisinopril 10 MG tablet Commonly known as:  PRINIVIL,ZESTRIL Take 10 mg by mouth every morning.   metFORMIN 1000 MG tablet Commonly known as:  GLUCOPHAGE Take 1,000 mg by mouth 2 (two) times daily with a meal.  metoprolol 50 MG tablet Commonly known as:  LOPRESSOR Take 50 mg by mouth 2 (two) times daily.   simvastatin 20 MG tablet Commonly known as:  ZOCOR Take 20 mg by mouth every morning. Notes to patient:  01/25/16       Diagnostic Studies: Ct Abdomen Pelvis W Contrast  Result Date: 01/12/2016 CLINICAL DATA:  Gross hematuria and back pain for 6 months. No known injury. EXAM: CT ABDOMEN AND PELVIS WITH CONTRAST TECHNIQUE: Multidetector CT imaging of the abdomen and pelvis was performed using the standard protocol following bolus administration of intravenous contrast. CONTRAST:  100 ml ISOVUE-300 IOPAMIDOL (ISOVUE-300) INJECTION 61% COMPARISON:  None. FINDINGS: Lower chest: Lung bases are clear. No pleural or pericardial  effusion. Heart size is normal. The patient is status post CABG. Hepatobiliary: A few tiny gravel type stones are seen layering dependently within the gallbladder. No evidence of cholecystitis. The liver and biliary tree appear normal. Pancreas: Unremarkable. No pancreatic ductal dilatation or surrounding inflammatory changes. Spleen: Normal in size without focal abnormality. Adrenals/Urinary Tract: An enhancing mass in the posterior aspect of the urinary bladder measures 4.6 cm craniocaudal by 5.2 cm transverse by 3.5 cm AP. The kidneys, ureters and adrenal glands appear normal. Stomach/Bowel: Stomach is within normal limits. Appendix appears normal. No evidence of bowel wall thickening, distention, or inflammatory changes. Vascular/Lymphatic: Aortoiliac atherosclerosis is identified. Infrarenal abdominal aortic aneurysm measures 3.4 cm transverse by 3.1 cm AP. Circumaortic left renal vein is noted. Reproductive: The prostate gland is mildly enlarged and deviates the rectum to the left. Other: No fluid collection. Musculoskeletal: There is convex left scoliosis with loss of disc space height and endplate spurring in the mid lumbar spine. The patient has transitional lumbosacral anatomy. IMPRESSION: Large enhancing mass in the urinary bladder most consistent with carcinoma. No evidence of metastatic disease identified. Mild prostatomegaly. **An incidental finding of potential clinical significance has been found. Abdominal aortic aneurysm measuring 3.1 x 3.4 cm Recommend followup by ultrasound in 3 years. This recommendation follows ACR consensus guidelines: White Paper of the ACR Incidental Findings Committee II on Vascular Findings. J Am Coll Radiol 2013; 10:789-794** A few small gallstones are seen.  Negative for cholecystitis. Electronically Signed   By: Inge Rise M.D.   On: 01/12/2016 11:29    Disposition: 01-Home or Self Care  Discharge Instructions    Discontinue IV    Complete by:  As directed        Follow-up Information    Malka So, MD Follow up on 02/08/2016.   Specialty:  Urology Why:  [id number] information: [place] [address] [phone]            Signed: Malka So 01/25/2016, 7:53 AM

## 2016-02-08 DIAGNOSIS — C678 Malignant neoplasm of overlapping sites of bladder: Secondary | ICD-10-CM | POA: Diagnosis not present

## 2016-02-26 ENCOUNTER — Encounter: Payer: Self-pay | Admitting: *Deleted

## 2016-02-26 DIAGNOSIS — E119 Type 2 diabetes mellitus without complications: Secondary | ICD-10-CM | POA: Insufficient documentation

## 2016-02-26 DIAGNOSIS — E1159 Type 2 diabetes mellitus with other circulatory complications: Secondary | ICD-10-CM | POA: Insufficient documentation

## 2016-02-27 ENCOUNTER — Ambulatory Visit (INDEPENDENT_AMBULATORY_CARE_PROVIDER_SITE_OTHER): Payer: Medicare HMO | Admitting: Cardiovascular Disease

## 2016-02-27 ENCOUNTER — Encounter: Payer: Self-pay | Admitting: Cardiovascular Disease

## 2016-02-27 ENCOUNTER — Encounter: Payer: Self-pay | Admitting: *Deleted

## 2016-02-27 VITALS — BP 144/78 | HR 58 | Ht 63.0 in | Wt 144.0 lb

## 2016-02-27 DIAGNOSIS — C670 Malignant neoplasm of trigone of bladder: Secondary | ICD-10-CM | POA: Diagnosis not present

## 2016-02-27 DIAGNOSIS — Z716 Tobacco abuse counseling: Secondary | ICD-10-CM

## 2016-02-27 DIAGNOSIS — R310 Gross hematuria: Secondary | ICD-10-CM | POA: Diagnosis not present

## 2016-02-27 DIAGNOSIS — Z5111 Encounter for antineoplastic chemotherapy: Secondary | ICD-10-CM | POA: Diagnosis not present

## 2016-02-27 DIAGNOSIS — E782 Mixed hyperlipidemia: Secondary | ICD-10-CM

## 2016-02-27 DIAGNOSIS — I25708 Atherosclerosis of coronary artery bypass graft(s), unspecified, with other forms of angina pectoris: Secondary | ICD-10-CM

## 2016-02-27 DIAGNOSIS — Z951 Presence of aortocoronary bypass graft: Secondary | ICD-10-CM | POA: Diagnosis not present

## 2016-02-27 DIAGNOSIS — I10 Essential (primary) hypertension: Secondary | ICD-10-CM

## 2016-02-27 DIAGNOSIS — I714 Abdominal aortic aneurysm, without rupture, unspecified: Secondary | ICD-10-CM

## 2016-02-27 DIAGNOSIS — C678 Malignant neoplasm of overlapping sites of bladder: Secondary | ICD-10-CM | POA: Diagnosis not present

## 2016-02-27 MED ORDER — ASPIRIN EC 81 MG PO TBEC: 81.0000 mg | DELAYED_RELEASE_TABLET | Freq: Every day | ORAL | Status: DC

## 2016-02-27 MED ORDER — NITROGLYCERIN 0.4 MG SL SUBL: 0.4000 mg | SUBLINGUAL_TABLET | SUBLINGUAL | 3 refills | Status: AC | PRN
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
## 2016-02-27 NOTE — Patient Instructions (Signed)
Medication Instructions:   Begin Aspirin 81mg  daily.  Begin Nitroglycerin as needed for severe chest pain only.  Continue all other medications.    Labwork: none  Testing/Procedures:  Your physician has requested that you have a lexiscan myoview. For further information please visit [email or web address]. Please follow instruction sheet, as given.  Office will contact with results via phone or letter.    Follow-Up: Your physician wants you to follow up in: 6 months.  You will receive a reminder letter in the mail one-two months in advance.  If you don't receive a letter, please call our office to schedule the follow up appointment   Any Other Special Instructions Will Be Listed Below (If Applicable).  If you need a refill on your cardiac medications before your next appointment, please call your pharmacy.

## 2016-03-08 ENCOUNTER — Encounter: Payer: Self-pay | Admitting: *Deleted

## 2016-03-08 DIAGNOSIS — C670 Malignant neoplasm of trigone of bladder: Secondary | ICD-10-CM | POA: Diagnosis not present

## 2016-03-08 DIAGNOSIS — R8271 Bacteriuria: Secondary | ICD-10-CM | POA: Diagnosis not present

## 2016-03-12 ENCOUNTER — Encounter (HOSPITAL_COMMUNITY): Payer: Commercial Managed Care - HMO

## 2016-03-22 DIAGNOSIS — Z5111 Encounter for antineoplastic chemotherapy: Secondary | ICD-10-CM | POA: Diagnosis not present

## 2016-03-22 DIAGNOSIS — C670 Malignant neoplasm of trigone of bladder: Secondary | ICD-10-CM | POA: Diagnosis not present

## 2016-04-09 DIAGNOSIS — Z5111 Encounter for antineoplastic chemotherapy: Secondary | ICD-10-CM | POA: Diagnosis not present

## 2016-04-09 DIAGNOSIS — C670 Malignant neoplasm of trigone of bladder: Secondary | ICD-10-CM | POA: Diagnosis not present

## 2016-04-19 DIAGNOSIS — C670 Malignant neoplasm of trigone of bladder: Secondary | ICD-10-CM | POA: Diagnosis not present

## 2016-04-19 DIAGNOSIS — Z5111 Encounter for antineoplastic chemotherapy: Secondary | ICD-10-CM | POA: Diagnosis not present

## 2016-04-23 ENCOUNTER — Encounter (HOSPITAL_COMMUNITY)
Admission: RE | Admit: 2016-04-23 | Discharge: 2016-04-23 | Disposition: A | Discharge status: Home / Self Care | Payer: Medicare HMO | Source: Ambulatory Visit | Attending: Cardiovascular Disease | Admitting: Cardiovascular Disease

## 2016-04-23 ENCOUNTER — Encounter (HOSPITAL_COMMUNITY): Payer: Self-pay

## 2016-04-23 ENCOUNTER — Inpatient Hospital Stay (HOSPITAL_COMMUNITY): Admission: RE | Admit: 2016-04-23 | Payer: Medicare HMO | Source: Ambulatory Visit

## 2016-04-23 DIAGNOSIS — Z951 Presence of aortocoronary bypass graft: Secondary | ICD-10-CM | POA: Diagnosis not present

## 2016-04-23 LAB — NM MYOCAR MULTI W/SPECT W/WALL MOTION / EF
CHL CUP NUCLEAR SDS: 0
CHL CUP NUCLEAR SSS: 2
LHR: 0.34
LV dias vol: 98 mL (ref 62–150)
LV sys vol: 42 mL
Peak HR: 74 {beats}/min
Rest HR: 51 {beats}/min
SRS: 2
TID: 1.26

## 2016-04-23 MED ORDER — TECHNETIUM TC 99M TETROFOSMIN IV KIT
10.0000 | PACK | Freq: Once | INTRAVENOUS | Status: AC | PRN
  Administered 2016-04-23: 9.6 via INTRAVENOUS

## 2016-04-23 MED ORDER — SODIUM CHLORIDE 0.9% FLUSH
INTRAVENOUS | Status: AC
  Administered 2016-04-23: 10 mL via INTRAVENOUS
  Filled 2016-04-23: qty 10

## 2016-04-23 MED ORDER — REGADENOSON 0.4 MG/5ML IV SOLN
INTRAVENOUS | Status: AC
  Administered 2016-04-23: 0.4 mg via INTRAVENOUS
  Filled 2016-04-23: qty 5

## 2016-04-23 MED ORDER — SODIUM CHLORIDE 0.9% FLUSH
INTRAVENOUS | Status: AC
  Filled 2016-04-23: qty 150

## 2016-04-23 MED ORDER — TECHNETIUM TC 99M TETROFOSMIN IV KIT
30.0000 | PACK | Freq: Once | INTRAVENOUS | Status: AC | PRN
  Administered 2016-04-23: 30 via INTRAVENOUS

## 2016-04-24 ENCOUNTER — Telehealth: Payer: Self-pay | Admitting: *Deleted

## 2016-04-24 NOTE — Telephone Encounter (Signed)
Called patient with test results. No answer. Left message to call back.  

## 2016-04-24 NOTE — Telephone Encounter (Signed)
-----   Message from Herminio Commons, MD sent at 04/24/2016  9:18 AM EDT ----- Small blockage. Low risk. To be managed medically.

## 2016-04-25 DIAGNOSIS — C670 Malignant neoplasm of trigone of bladder: Secondary | ICD-10-CM | POA: Diagnosis not present

## 2016-04-25 DIAGNOSIS — Z5111 Encounter for antineoplastic chemotherapy: Secondary | ICD-10-CM | POA: Diagnosis not present

## 2016-05-02 DIAGNOSIS — Z5111 Encounter for antineoplastic chemotherapy: Secondary | ICD-10-CM | POA: Diagnosis not present

## 2016-05-02 DIAGNOSIS — C678 Malignant neoplasm of overlapping sites of bladder: Secondary | ICD-10-CM | POA: Diagnosis not present

## 2016-08-05 DIAGNOSIS — Z8551 Personal history of malignant neoplasm of bladder: Secondary | ICD-10-CM | POA: Diagnosis not present

## 2016-08-30 DIAGNOSIS — Z6825 Body mass index (BMI) 25.0-25.9, adult: Secondary | ICD-10-CM | POA: Diagnosis not present

## 2016-08-30 DIAGNOSIS — E782 Mixed hyperlipidemia: Secondary | ICD-10-CM | POA: Diagnosis not present

## 2016-08-30 DIAGNOSIS — I10 Essential (primary) hypertension: Secondary | ICD-10-CM | POA: Diagnosis not present

## 2016-08-30 DIAGNOSIS — F17290 Nicotine dependence, other tobacco product, uncomplicated: Secondary | ICD-10-CM | POA: Diagnosis not present

## 2016-08-30 DIAGNOSIS — Z139 Encounter for screening, unspecified: Secondary | ICD-10-CM | POA: Diagnosis not present

## 2016-08-30 DIAGNOSIS — E119 Type 2 diabetes mellitus without complications: Secondary | ICD-10-CM | POA: Diagnosis not present

## 2016-12-09 DIAGNOSIS — Z8551 Personal history of malignant neoplasm of bladder: Secondary | ICD-10-CM | POA: Diagnosis not present

## 2016-12-31 DIAGNOSIS — C678 Malignant neoplasm of overlapping sites of bladder: Secondary | ICD-10-CM | POA: Diagnosis not present

## 2016-12-31 DIAGNOSIS — Z5111 Encounter for antineoplastic chemotherapy: Secondary | ICD-10-CM | POA: Diagnosis not present

## 2017-01-07 DIAGNOSIS — C678 Malignant neoplasm of overlapping sites of bladder: Secondary | ICD-10-CM | POA: Diagnosis not present

## 2017-01-07 DIAGNOSIS — Z5111 Encounter for antineoplastic chemotherapy: Secondary | ICD-10-CM | POA: Diagnosis not present

## 2017-01-16 DIAGNOSIS — Z5111 Encounter for antineoplastic chemotherapy: Secondary | ICD-10-CM | POA: Diagnosis not present

## 2017-01-16 DIAGNOSIS — C678 Malignant neoplasm of overlapping sites of bladder: Secondary | ICD-10-CM | POA: Diagnosis not present

## 2017-03-12 DIAGNOSIS — C672 Malignant neoplasm of lateral wall of bladder: Secondary | ICD-10-CM | POA: Diagnosis not present

## 2017-03-13 ENCOUNTER — Other Ambulatory Visit: Payer: Self-pay | Admitting: Urology

## 2017-03-13 ENCOUNTER — Other Ambulatory Visit: Payer: Self-pay

## 2017-03-13 ENCOUNTER — Encounter (HOSPITAL_BASED_OUTPATIENT_CLINIC_OR_DEPARTMENT_OTHER): Payer: Self-pay

## 2017-03-13 NOTE — Progress Notes (Signed)
Spoke with:  Kenneth Kelly NPO:  After Midnight, no gum, candy, or mints  NO SMOKING Arrival time:  0530AM Labs: Istat8, EKG AM medications: Amlodipine, Metoprolol, Simvastatin Pre op orders: Yes Ride home: Kenneth Kelly (Niece) [phone]

## 2017-03-21 ENCOUNTER — Other Ambulatory Visit: Payer: Self-pay

## 2017-03-21 ENCOUNTER — Encounter (HOSPITAL_BASED_OUTPATIENT_CLINIC_OR_DEPARTMENT_OTHER): Payer: Self-pay | Admitting: *Deleted

## 2017-03-21 NOTE — Progress Notes (Addendum)
SPOKE W/ PT VIA PHONE FOR PRE-OP INTERVIEW. UPDATED PT HEALTH HISTORY IN Epic.  NPO AFTER MN.  ARRIVE AT 0530.  NEED ISTAT AND EKG.  WILL TAKE NORVASC, LOPRESSOR, AND ZOCOR AM DOS W/ SIPS OF WATER.

## 2017-04-07 NOTE — H&P (Signed)
CC: I have bladder cancer.  HPI: Kenneth Kelly is a 67 year-old male established patient who is here for bladder cancer.  His problem was diagnosed 01/23/2016. The bladder cancer was found because of blood in his urine.   His bladder cancer was treated by removal with scope. Patient denies removal of the entire bladder, radiation, and chemotherapy.   [name] returns today in f/u for his history of bladder cancer. He had a TURBT with insertion of a right ureteral stent for resection near the RUO on 01/23/16. 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 He completed BCG on 05/02/16 and has been on maintenance with the last treatment in 12/18. He had minimal side effects from the treatment. He has had no hematuria or voiding complaints. He has no associated signs or symptoms.     CC: AUA Questions Scoring.  HPI:     AUA Symptom Score: 50% of the time he has the sensation of not emptying his bladder completely when finished urinating. He never has to urinate again less that two hours after he has finished urinating. He does not have to stop and start again several times when he urinates. Less than 20% of the time he finds it difficult to postpone urination. Less than 20% of the time he has a weak urinary stream. He never has to push or strain to begin urination. He has to get up to urinate 1 time from the time he goes to bed until the time he gets up in the morning.   Calculated AUA Symptom Score: 6    ALLERGIES: None   MEDICATIONS: Lisinopril 10 mg tablet  Metformin Hcl 1,000 mg tablet  Metoprolol Tartrate 50 mg tablet  Simvastatin 20 mg tablet  Amlodipine Besylate 10 mg tablet     GU PSH: Bladder Instill AntiCA Agent - 01/16/2017, 01/07/2017, 12/31/2016, 05/02/2016, 04/25/2016, 04/19/2016, 04/09/2016, 03/22/2016, 02/27/2016 Cysto Remove Stent FB Sim - 02/08/2016 Cystoscopy - 12/09/2016, 08/05/2016 Cystoscopy Insert Stent, Right - 01/23/2016 Cystoscopy TURBT >5 cm -  01/23/2016    NON-GU PSH: Coronary Artery Bypass Grafting Lumbar Laminectomy    GU PMH: History of bladder cancer, No recurrent tumor noted. He will return in 3 months for cystoscopy and possible BCG maintenance. Cytology sent. - 08/05/2016 Bladder Cancer overlapping sites - 02/08/2016 Bladder Cancer Trigone , He has a 5.2cm mass at the trigone that is the cause of the hematuria. - 01/18/2016 Gross hematuria - 01/18/2016    NON-GU PMH: Abdominal aortic aneurysm, He has a 3.3cm AAA and will need surveillance for this . - 01/18/2016 Arthritis Depression Diabetes Type 2 GERD Gout Hypercholesterolemia Hypertension Myocardial Infarction    FAMILY HISTORY: Diabetes - Mother Prostate Cancer - Brother   SOCIAL HISTORY: Marital Status: Divorced Preferred Language: English; Ethnicity: Not Hispanic Or Latino; Race: White Current Smoking Status: Patient smokes. Smokes 1/2 pack per day.  Has never drank.  Does not drink caffeine. Patient's occupation is/was retired.     Notes: 1 son   REVIEW OF SYSTEMS:    GU Review Male:   Patient denies frequent urination, hard to postpone urination, burning/ pain with urination, get up at night to urinate, leakage of urine, stream starts and stops, trouble starting your stream, have to strain to urinate , erection problems, and penile pain.  Gastrointestinal (Upper):   Patient denies nausea, vomiting, and indigestion/ heartburn.  Gastrointestinal (Lower):   Patient denies diarrhea and constipation.  Constitutional:   Patient denies fever, night sweats,  weight loss, and fatigue.  Skin:   Patient denies itching and skin rash/ lesion.  Eyes:   Patient denies blurred vision and double vision.  Ears/ Nose/ Throat:   Patient denies sore throat and sinus problems.  Hematologic/Lymphatic:   Patient denies swollen glands and easy bruising.  Cardiovascular:   Patient denies leg swelling and chest pains.  Respiratory:   Patient denies cough and shortness of  breath.  Endocrine:   Patient denies excessive thirst.  Musculoskeletal:   Patient denies back pain and joint pain.  Neurological:   Patient denies headaches and dizziness.  Psychologic:   Patient denies depression and anxiety.   VITAL SIGNS:      03/12/2017 01:49 PM  Weight 140 lb / 63.5 kg  Height 63 in / 160.02 cm  BP 124/71 mmHg  Pulse 52 /min  Temperature 97.8 F / 36.5 C  BMI 24.8 kg/m   MULTI-SYSTEM PHYSICAL EXAMINATION:    Constitutional: Well-nourished. No physical deformities. Normally developed. Good grooming.  Respiratory: No labored breathing, no use of accessory muscles. CTA  Cardiovascular: Normal temperature, RRR without murmur.     PAST DATA REVIEWED:  Source Of History:  Patient  Urine Test Review:   Urinalysis   01/18/16  PSA  Total PSA 0.93     PROCEDURES:         Flexible Cystoscopy - 52000  Risks, benefits, and some of the potential complications of the procedure were discussed. 27ml of 2% lidocaine jelly was instilled intraurethrally.     Meatus:  Normal size. Normal location. Normal condition.  Urethra:  No strictures.  External Sphincter:  Normal.  Verumontanum:  Normal.  Prostate:  Obstructing. Moderate hyperplasia.  Bladder Neck:  Non-obstructing.  Ureteral Orifices:  Normal location. Normal size. Normal shape. Effluxed clear urine.  Bladder:  Moderate trabeculation. A left lateral wall tumor. 1/2 cm tumor. Normal mucosa. No stones. small tic on the dome.      The procedure was well tolerated and there were no complications.         Urinalysis Dipstick Dipstick Cont'd  Color: Yellow Bilirubin: Neg  Appearance: Clear Ketones: Neg  Specific Gravity: 1.010 Blood: Neg  pH: <=5.0 Protein: Neg  Glucose: Neg Urobilinogen: 0.2    Nitrites: Neg    Leukocyte Esterase: Neg    ASSESSMENT:      ICD-10 Details  1 GU:   History of bladder cancer - Z85.51   2   Bladder Cancer Lateral - C67.2 He has a small apparent recurrence on the left lateral  wall. I will send a cytology today and get him set up for biopsy and fulguration. I have reviewed the risks of bleeding, infection, bladder wall injury, thrombotic events and anesthetic complications.    PLAN:           Orders Labs Urine Cytology          Schedule Labs: 3 Months - Urinalysis  Return Visit/Planned Activity: Next Available Appointment - Schedule Surgery

## 2017-04-07 NOTE — Anesthesia Preprocedure Evaluation (Signed)
Anesthesia Evaluation  Patient identified by MRN, date of birth, ID band Patient awake    Reviewed: Allergy & Precautions, NPO status , Patient's Chart, lab work & pertinent test results  Airway Mallampati: II  TM Distance: >3 FB Neck ROM: Full    Dental no notable dental hx.    Pulmonary neg pulmonary ROS, Current Smoker,    Pulmonary exam normal breath sounds clear to auscultation       Cardiovascular hypertension, Pt. on medications + CAD, + CABG and + Peripheral Vascular Disease  Normal cardiovascular exam Rhythm:Regular Rate:Normal     Neuro/Psych negative neurological ROS  negative psych ROS   GI/Hepatic negative GI ROS, Neg liver ROS,   Endo/Other  diabetes  Renal/GU negative Renal ROS  negative genitourinary   Musculoskeletal negative musculoskeletal ROS (+)   Abdominal   Peds negative pediatric ROS (+)  Hematology negative hematology ROS (+)   Anesthesia Other Findings   Reproductive/Obstetrics negative OB ROS                             Anesthesia Physical Anesthesia Plan  ASA: III  Anesthesia Plan: General   Post-op Pain Management:    Induction: Intravenous  PONV Risk Score and Plan: 1 and Ondansetron and Dexamethasone  Airway Management Planned: LMA  Additional Equipment:   Intra-op Plan:   Post-operative Plan: Extubation in OR  Informed Consent: I have reviewed the patients History and Physical, chart, labs and discussed the procedure including the risks, benefits and alternatives for the proposed anesthesia with the patient or authorized representative who has indicated his/her understanding and acceptance.   Dental advisory given  Plan Discussed with: CRNA and Surgeon  Anesthesia Plan Comments:         Anesthesia Quick Evaluation

## 2017-04-08 ENCOUNTER — Ambulatory Visit (HOSPITAL_BASED_OUTPATIENT_CLINIC_OR_DEPARTMENT_OTHER): Payer: Medicare Other | Admitting: Anesthesiology

## 2017-04-08 ENCOUNTER — Encounter (HOSPITAL_BASED_OUTPATIENT_CLINIC_OR_DEPARTMENT_OTHER): Payer: Self-pay

## 2017-04-08 ENCOUNTER — Ambulatory Visit (HOSPITAL_BASED_OUTPATIENT_CLINIC_OR_DEPARTMENT_OTHER)
Admission: RE | Admit: 2017-04-08 | Discharge: 2017-04-08 | Disposition: A | Discharge status: Home / Self Care | Payer: Medicare Other | Source: Ambulatory Visit | Attending: Urology | Admitting: Urology

## 2017-04-08 ENCOUNTER — Encounter (HOSPITAL_BASED_OUTPATIENT_CLINIC_OR_DEPARTMENT_OTHER)
Admission: RE | Disposition: A | Discharge status: Home / Self Care | Payer: Self-pay | Source: Ambulatory Visit | Attending: Urology

## 2017-04-08 ENCOUNTER — Other Ambulatory Visit: Payer: Self-pay

## 2017-04-08 DIAGNOSIS — Z7984 Long term (current) use of oral hypoglycemic drugs: Secondary | ICD-10-CM | POA: Insufficient documentation

## 2017-04-08 DIAGNOSIS — Z79899 Other long term (current) drug therapy: Secondary | ICD-10-CM | POA: Insufficient documentation

## 2017-04-08 DIAGNOSIS — Z08 Encounter for follow-up examination after completed treatment for malignant neoplasm: Secondary | ICD-10-CM | POA: Diagnosis not present

## 2017-04-08 DIAGNOSIS — I10 Essential (primary) hypertension: Secondary | ICD-10-CM | POA: Insufficient documentation

## 2017-04-08 DIAGNOSIS — I2510 Atherosclerotic heart disease of native coronary artery without angina pectoris: Secondary | ICD-10-CM | POA: Insufficient documentation

## 2017-04-08 DIAGNOSIS — Z8551 Personal history of malignant neoplasm of bladder: Secondary | ICD-10-CM | POA: Insufficient documentation

## 2017-04-08 DIAGNOSIS — I252 Old myocardial infarction: Secondary | ICD-10-CM | POA: Diagnosis not present

## 2017-04-08 DIAGNOSIS — E119 Type 2 diabetes mellitus without complications: Secondary | ICD-10-CM | POA: Diagnosis not present

## 2017-04-08 DIAGNOSIS — F17210 Nicotine dependence, cigarettes, uncomplicated: Secondary | ICD-10-CM | POA: Diagnosis not present

## 2017-04-08 DIAGNOSIS — Z951 Presence of aortocoronary bypass graft: Secondary | ICD-10-CM | POA: Insufficient documentation

## 2017-04-08 DIAGNOSIS — E1151 Type 2 diabetes mellitus with diabetic peripheral angiopathy without gangrene: Secondary | ICD-10-CM | POA: Diagnosis not present

## 2017-04-08 DIAGNOSIS — I714 Abdominal aortic aneurysm, without rupture: Secondary | ICD-10-CM | POA: Insufficient documentation

## 2017-04-08 DIAGNOSIS — N3289 Other specified disorders of bladder: Secondary | ICD-10-CM | POA: Diagnosis not present

## 2017-04-08 HISTORY — DX: Personal history of (healed) traumatic fracture: Z87.81

## 2017-04-08 HISTORY — DX: Bradycardia, unspecified: R00.1

## 2017-04-08 HISTORY — DX: Abdominal aortic aneurysm, without rupture, unspecified: I71.40

## 2017-04-08 HISTORY — PX: CYSTOSCOPY WITH BIOPSY: SHX5122

## 2017-04-08 HISTORY — DX: Atrioventricular block, first degree: I44.0

## 2017-04-08 HISTORY — DX: Personal history of malignant neoplasm of bladder: Z85.51

## 2017-04-08 HISTORY — DX: Unspecified osteoarthritis, unspecified site: M19.90

## 2017-04-08 HISTORY — DX: Type 2 diabetes mellitus without complications: E11.9

## 2017-04-08 HISTORY — DX: Abdominal aortic aneurysm, without rupture: I71.4

## 2017-04-08 HISTORY — DX: Presence of aortocoronary bypass graft: Z95.1

## 2017-04-08 LAB — POCT I-STAT 4, (NA,K, GLUC, HGB,HCT)
Glucose, Bld: 147 mg/dL — ABNORMAL HIGH (ref 65–99)
HEMATOCRIT: 40 % (ref 39.0–52.0)
Hemoglobin: 13.6 g/dL (ref 13.0–17.0)
POTASSIUM: 3.6 mmol/L (ref 3.5–5.1)
SODIUM: 143 mmol/L (ref 135–145)

## 2017-04-08 LAB — GLUCOSE, CAPILLARY: GLUCOSE-CAPILLARY: 157 mg/dL — AB (ref 65–99)

## 2017-04-08 SURGERY — CYSTOSCOPY, WITH BIOPSY
Anesthesia: General | Site: Bladder

## 2017-04-08 MED ORDER — PROPOFOL 10 MG/ML IV BOLUS
INTRAVENOUS | Status: AC
  Filled 2017-04-08: qty 40

## 2017-04-08 MED ORDER — PROMETHAZINE HCL 25 MG/ML IJ SOLN
6.2500 mg | INTRAMUSCULAR | Status: DC | PRN
  Filled 2017-04-08: qty 1

## 2017-04-08 MED ORDER — PHENYLEPHRINE 40 MCG/ML (10ML) SYRINGE FOR IV PUSH (FOR BLOOD PRESSURE SUPPORT)
PREFILLED_SYRINGE | INTRAVENOUS | Status: AC
  Filled 2017-04-08: qty 10

## 2017-04-08 MED ORDER — LIDOCAINE 2% (20 MG/ML) 5 ML SYRINGE
INTRAMUSCULAR | Status: DC | PRN
  Administered 2017-04-08: 70 mg via INTRAVENOUS

## 2017-04-08 MED ORDER — CEFAZOLIN SODIUM-DEXTROSE 2-4 GM/100ML-% IV SOLN
INTRAVENOUS | Status: AC
  Filled 2017-04-08: qty 100

## 2017-04-08 MED ORDER — KETOROLAC TROMETHAMINE 30 MG/ML IJ SOLN
30.0000 mg | Freq: Once | INTRAMUSCULAR | Status: DC | PRN
  Filled 2017-04-08: qty 1

## 2017-04-08 MED ORDER — ONDANSETRON HCL 4 MG/2ML IJ SOLN
INTRAMUSCULAR | Status: AC
  Filled 2017-04-08: qty 2

## 2017-04-08 MED ORDER — DEXAMETHASONE SODIUM PHOSPHATE 10 MG/ML IJ SOLN
INTRAMUSCULAR | Status: DC | PRN
  Administered 2017-04-08: 10 mg via INTRAVENOUS

## 2017-04-08 MED ORDER — FENTANYL CITRATE (PF) 100 MCG/2ML IJ SOLN
INTRAMUSCULAR | Status: DC | PRN
  Administered 2017-04-08: 50 ug via INTRAVENOUS

## 2017-04-08 MED ORDER — STERILE WATER FOR IRRIGATION IR SOLN
Status: DC | PRN
  Administered 2017-04-08: 3000 mL

## 2017-04-08 MED ORDER — FENTANYL CITRATE (PF) 100 MCG/2ML IJ SOLN
25.0000 ug | INTRAMUSCULAR | Status: DC | PRN
  Filled 2017-04-08: qty 1

## 2017-04-08 MED ORDER — LIDOCAINE 2% (20 MG/ML) 5 ML SYRINGE
INTRAMUSCULAR | Status: AC
  Filled 2017-04-08: qty 5

## 2017-04-08 MED ORDER — DEXAMETHASONE SODIUM PHOSPHATE 10 MG/ML IJ SOLN
INTRAMUSCULAR | Status: AC
  Filled 2017-04-08: qty 1

## 2017-04-08 MED ORDER — EPHEDRINE SULFATE-NACL 50-0.9 MG/10ML-% IV SOSY
PREFILLED_SYRINGE | INTRAVENOUS | Status: DC | PRN
  Administered 2017-04-08 (×2): 10 mg via INTRAVENOUS

## 2017-04-08 MED ORDER — ONDANSETRON HCL 4 MG/2ML IJ SOLN
INTRAMUSCULAR | Status: DC | PRN
  Administered 2017-04-08: 4 mg via INTRAVENOUS

## 2017-04-08 MED ORDER — LACTATED RINGERS IV SOLN
INTRAVENOUS | Status: DC
  Administered 2017-04-08: 07:00:00 via INTRAVENOUS
  Filled 2017-04-08: qty 1000

## 2017-04-08 MED ORDER — EPHEDRINE 5 MG/ML INJ
INTRAVENOUS | Status: AC
  Filled 2017-04-08: qty 10

## 2017-04-08 MED ORDER — CEFAZOLIN SODIUM-DEXTROSE 2-4 GM/100ML-% IV SOLN
2.0000 g | INTRAVENOUS | Status: AC
  Administered 2017-04-08: 2 g via INTRAVENOUS
  Filled 2017-04-08: qty 100

## 2017-04-08 MED ORDER — PROPOFOL 10 MG/ML IV BOLUS
INTRAVENOUS | Status: DC | PRN
  Administered 2017-04-08: 150 mg via INTRAVENOUS

## 2017-04-08 MED ORDER — FENTANYL CITRATE (PF) 100 MCG/2ML IJ SOLN
INTRAMUSCULAR | Status: AC
  Filled 2017-04-08: qty 2

## 2017-04-08 SURGICAL SUPPLY — 23 items
BAG DRAIN URO-CYSTO SKYTR STRL (DRAIN) ×3 IMPLANT
BAG DRN UROCATH (DRAIN) ×1
CATH FOLEY 2WAY SLVR  5CC 16FR (CATHETERS)
CATH FOLEY 2WAY SLVR 5CC 16FR (CATHETERS) IMPLANT
CLOTH BEACON ORANGE TIMEOUT ST (SAFETY) ×3 IMPLANT
ELECT REM PT RETURN 9FT ADLT (ELECTROSURGICAL) ×3
ELECTRODE REM PT RTRN 9FT ADLT (ELECTROSURGICAL) ×1 IMPLANT
GLOVE SURG SS PI 8.0 STRL IVOR (GLOVE) ×3 IMPLANT
GOWN STRL REUS W/ TWL LRG LVL3 (GOWN DISPOSABLE) ×1 IMPLANT
GOWN STRL REUS W/ TWL XL LVL3 (GOWN DISPOSABLE) ×1 IMPLANT
GOWN STRL REUS W/TWL LRG LVL3 (GOWN DISPOSABLE) ×3
GOWN STRL REUS W/TWL XL LVL3 (GOWN DISPOSABLE) ×3
KIT TURNOVER CYSTO (KITS) ×3 IMPLANT
MANIFOLD NEPTUNE II (INSTRUMENTS) ×3 IMPLANT
NDL SAFETY ECLIPSE 18X1.5 (NEEDLE) IMPLANT
NEEDLE HYPO 18GX1.5 SHARP (NEEDLE)
NEEDLE HYPO 22GX1.5 SAFETY (NEEDLE) IMPLANT
NS IRRIG 500ML POUR BTL (IV SOLUTION) IMPLANT
PACK CYSTO (CUSTOM PROCEDURE TRAY) ×3 IMPLANT
SYR 20CC LL (SYRINGE) IMPLANT
TUBE CONNECTING 12'X1/4 (SUCTIONS) ×1
TUBE CONNECTING 12X1/4 (SUCTIONS) ×1 IMPLANT
WATER STERILE IRR 3000ML UROMA (IV SOLUTION) ×3 IMPLANT

## 2017-04-08 NOTE — Discharge Instructions (Addendum)
CYSTOSCOPY HOME CARE INSTRUCTIONS ° °Activity: °Rest for the remainder of the day.  Do not drive or operate equipment today.  You may resume normal activities in one to two days as instructed by your physician.  ° °Meals: °Drink plenty of liquids and eat light foods such as gelatin or soup this evening.  You may return to a normal meal plan tomorrow. ° °Return to Work: °You may return to work in one to two days or as instructed by your physician. ° °Special Instructions / Symptoms: °Call your physician if any of these symptoms occur: ° ° -persistent or heavy bleeding ° -bleeding which continues after first few urination ° -large blood clots that are difficult to pass ° -urine stream diminishes or stops completely ° -fever equal to or higher than 101 degrees Farenheit. ° -cloudy urine with a strong, foul odor ° -severe pain ° You may feel some burning pain when you urinate.  This should disappear with time.  Applying moist heat to the lower abdomen or a hot tub bath may help relieve the pain.  ° ° ° ° °Post Anesthesia Home Care Instructions ° °Activity: °Get plenty of rest for the remainder of the day. A responsible individual must stay with you for 24 hours following the procedure.  °For the next 24 hours, DO NOT: °-Drive a car °-Operate machinery °-Drink alcoholic beverages °-Take any medication unless instructed by your physician °-Make any legal decisions or sign important papers. ° °Meals: °Start with liquid foods such as gelatin or soup. Progress to regular foods as tolerated. Avoid greasy, spicy, heavy foods. If nausea and/or vomiting occur, drink only clear liquids until the nausea and/or vomiting subsides. Call your physician if vomiting continues. ° °Special Instructions/Symptoms: °Your throat may feel dry or sore from the anesthesia or the breathing tube placed in your throat during surgery. If this causes discomfort, gargle with warm salt water. The discomfort should disappear within 24 hours. ° °If you  had a scopolamine patch placed behind your ear for the management of post- operative nausea and/or vomiting: ° °1. The medication in the patch is effective for 72 hours, after which it should be removed.  Wrap patch in a tissue and discard in the trash. Wash hands thoroughly with soap and water. °2. You may remove the patch earlier than 72 hours if you experience unpleasant side effects which may include dry mouth, dizziness or visual disturbances. °3. Avoid touching the patch. Wash your hands with soap and water after contact with the patch. °   ° °

## 2017-04-08 NOTE — Anesthesia Postprocedure Evaluation (Signed)
Anesthesia Post Note  Patient: Kenneth Kelly  Procedure(s) Performed: CYSTOSCOPY (N/A Bladder)     Patient location during evaluation: PACU Anesthesia Type: General Level of consciousness: awake and alert Pain management: pain level controlled Vital Signs Assessment: post-procedure vital signs reviewed and stable Respiratory status: spontaneous breathing, nonlabored ventilation, respiratory function stable and patient connected to nasal cannula oxygen Cardiovascular status: blood pressure returned to baseline and stable Postop Assessment: no apparent nausea or vomiting Anesthetic complications: no    Last Vitals:  Vitals:   04/08/17 0830 04/08/17 0835  BP: (!) 151/74   Pulse: (!) 58 60  Resp: 19 18  Temp:    SpO2: 93% 93%    Last Pain:  Vitals:   04/08/17 0609  TempSrc: Oral                 [name]

## 2017-04-08 NOTE — Interval H&P Note (Signed)
History and Physical Interval Note:  04/08/2017 7:10 AM  Kenneth Kelly  has presented today for surgery, with the diagnosis of BLADDER TUMOR  The various methods of treatment have been discussed with the patient and family. After consideration of risks, benefits and other options for treatment, the patient has consented to  Procedure(s): CYSTOSCOPY WITH BIOPSY AND FULGURATION INSTILL EPIRUBACIN (N/A) as a surgical intervention .  The patient's history has been reviewed, patient examined, no change in status, stable for surgery.  I have reviewed the patient's chart and labs.  Questions were answered to the patient's satisfaction.     [name]

## 2017-04-08 NOTE — Anesthesia Procedure Notes (Signed)
Procedure Name: LMA Insertion Date/Time: 04/08/2017 7:24 AM Performed by: Bonney Aid, CRNA Pre-anesthesia Checklist: Patient identified, Emergency Drugs available, Suction available and Patient being monitored Patient Re-evaluated:Patient Re-evaluated prior to induction Oxygen Delivery Method: Circle system utilized Preoxygenation: Pre-oxygenation with 100% oxygen Induction Type: IV induction Ventilation: Mask ventilation without difficulty LMA: LMA inserted LMA Size: 4.0 Number of attempts: 1 Airway Equipment and Method: Bite block Placement Confirmation: positive ETCO2 Tube secured with: Tape Dental Injury: Teeth and Oropharynx as per pre-operative assessment

## 2017-04-08 NOTE — Op Note (Signed)
Procedure: Cystoscopy.  Preop diagnosis: Possible recurrent bladder cancer.  Postop diagnosis: History of bladder cancer no lesion found.  Surgeon: Dr. [name].  Anesthesia: General.  Specimen: None.  Drain: None.  EBL: None.  Complication: None.  Indication: Kenneth Kelly is an 67 year old white male with history of bladder cancer with prior BCG therapy who was found on recent surveillance cystoscopy to have a 5 mm lesion on the left lateral wall of the bladder it was worrisome for recurrent urothelial carcinoma.  He is to undergo cystoscopy with possible biopsy and instillation of intravesical chemotherapy today.  Procedure: He was given antibiotic and taken to the operating room where general anesthetic was induced.  He was placed in lithotomy position was prepped with Betadine solution.  He had been fitted with PAS hose prior to prepping.  He was draped in usual sterile fashion.  Cystoscopy was performed using a 60 [place] scope with the 30 and 70 degree lenses.  Examination revealed a normal urethra.  Prostate urethra was short with bilobar hyperplasia with some obstruction.  Examination of bladder revealed severe trabeculation particular on the dome some cellules.  There was a scar at the base the bladder from his prior resection.  Ureteral orifices were in the normal anatomic position although the right ureteral orifice was somewhat distorted from the resection.  A very thorough inspection of the bladder mucosa was performed with the bladder distended and partially drained.  He had some increased vascular density but  but I no longer was able to identify the lesion seen on office cystoscopy and found no lesions that merited biopsy.  It is possible that he had further tumor involution following his last maintenance BCG in December.  The bladder was partially drained.  The scope was removed.  He was taken down from the lithotomy position, his anesthetic was reversed and he was moved to  recovery room in stable condition.  There were no complications.

## 2017-04-08 NOTE — Transfer of Care (Addendum)
Immediate Anesthesia Transfer of Care Note  Patient: Kenneth Kelly  Procedure(s) Performed: CYSTOSCOPY (N/A Bladder)  Patient Location: PACU  Anesthesia Type:General  Level of Consciousness: drowsy  Airway & Oxygen Therapy: Patient Spontanous Breathing and Patient connected to nasal cannula oxygen  Post-op Assessment: Report given to RN  Post vital signs: Reviewed and stable  Last Vitals: vs at 0758, time entered wrong on monitor Vitals:   04/08/17 0609 04/08/17 0745  BP: (!) 152/72 (!) 149/76  Pulse: 69 (!) 59  Resp: 17 18  Temp: 36.9 C (!) 36.2 C  SpO2: 95% 100%    Last Pain:  Vitals:   04/08/17 0609  TempSrc: Oral      Patients Stated Pain Goal: 5 (96/43/83 8184)  Complications: No apparent anesthesia complications

## 2017-04-09 ENCOUNTER — Encounter (HOSPITAL_BASED_OUTPATIENT_CLINIC_OR_DEPARTMENT_OTHER): Payer: Self-pay | Admitting: Urology

## 2017-04-18 DIAGNOSIS — M1000 Idiopathic gout, unspecified site: Secondary | ICD-10-CM | POA: Diagnosis not present

## 2017-04-18 DIAGNOSIS — Z23 Encounter for immunization: Secondary | ICD-10-CM | POA: Diagnosis not present

## 2017-04-18 DIAGNOSIS — I2510 Atherosclerotic heart disease of native coronary artery without angina pectoris: Secondary | ICD-10-CM | POA: Diagnosis not present

## 2017-04-18 DIAGNOSIS — Z6824 Body mass index (BMI) 24.0-24.9, adult: Secondary | ICD-10-CM | POA: Diagnosis not present

## 2017-04-18 DIAGNOSIS — I10 Essential (primary) hypertension: Secondary | ICD-10-CM | POA: Diagnosis not present

## 2017-04-18 DIAGNOSIS — E782 Mixed hyperlipidemia: Secondary | ICD-10-CM | POA: Diagnosis not present

## 2017-04-18 DIAGNOSIS — E119 Type 2 diabetes mellitus without complications: Secondary | ICD-10-CM | POA: Diagnosis not present

## 2017-04-21 DIAGNOSIS — E119 Type 2 diabetes mellitus without complications: Secondary | ICD-10-CM | POA: Diagnosis not present

## 2017-04-21 DIAGNOSIS — Z23 Encounter for immunization: Secondary | ICD-10-CM | POA: Diagnosis not present

## 2017-04-21 DIAGNOSIS — M109 Gout, unspecified: Secondary | ICD-10-CM | POA: Diagnosis not present

## 2017-04-21 DIAGNOSIS — E782 Mixed hyperlipidemia: Secondary | ICD-10-CM | POA: Diagnosis not present

## 2017-08-13 ENCOUNTER — Telehealth: Payer: Self-pay | Admitting: Cardiovascular Disease

## 2017-08-13 NOTE — Telephone Encounter (Signed)
Numerous attempts to contact patient with recall letters. Unable to reach by telephone. with no success.   User: Time: Status:    Kenneth Kelly [[id number]] 02/27/2016 9:54 AM New [10]   [System] 05/05/2016 11:04 PM Notification Sent [20]   Kenneth Kelly [[id number]] 10/30/2016 12:58 PM Notification Sent [20]   Kenneth Kelly [[id number]] 07/24/2017 8:24 AM Notification Sent [20]

## 2017-08-27 DIAGNOSIS — Z8551 Personal history of malignant neoplasm of bladder: Secondary | ICD-10-CM | POA: Diagnosis not present

## 2017-09-03 DIAGNOSIS — E119 Type 2 diabetes mellitus without complications: Secondary | ICD-10-CM | POA: Diagnosis not present

## 2017-09-03 DIAGNOSIS — Z6823 Body mass index (BMI) 23.0-23.9, adult: Secondary | ICD-10-CM | POA: Diagnosis not present

## 2017-09-03 DIAGNOSIS — H6091 Unspecified otitis externa, right ear: Secondary | ICD-10-CM | POA: Diagnosis not present

## 2017-10-07 DIAGNOSIS — C678 Malignant neoplasm of overlapping sites of bladder: Secondary | ICD-10-CM | POA: Diagnosis not present

## 2017-10-14 DIAGNOSIS — C678 Malignant neoplasm of overlapping sites of bladder: Secondary | ICD-10-CM | POA: Diagnosis not present

## 2017-10-14 DIAGNOSIS — Z5111 Encounter for antineoplastic chemotherapy: Secondary | ICD-10-CM | POA: Diagnosis not present

## 2017-10-21 DIAGNOSIS — C678 Malignant neoplasm of overlapping sites of bladder: Secondary | ICD-10-CM | POA: Diagnosis not present

## 2017-10-21 DIAGNOSIS — Z5111 Encounter for antineoplastic chemotherapy: Secondary | ICD-10-CM | POA: Diagnosis not present

## 2017-10-23 ENCOUNTER — Ambulatory Visit (INDEPENDENT_AMBULATORY_CARE_PROVIDER_SITE_OTHER): Payer: Medicare Other | Admitting: Otolaryngology

## 2017-10-23 DIAGNOSIS — H903 Sensorineural hearing loss, bilateral: Secondary | ICD-10-CM | POA: Diagnosis not present

## 2017-10-23 DIAGNOSIS — H608X1 Other otitis externa, right ear: Secondary | ICD-10-CM

## 2017-11-24 DIAGNOSIS — E119 Type 2 diabetes mellitus without complications: Secondary | ICD-10-CM | POA: Diagnosis not present

## 2017-11-24 DIAGNOSIS — Z0000 Encounter for general adult medical examination without abnormal findings: Secondary | ICD-10-CM | POA: Diagnosis not present

## 2017-11-24 DIAGNOSIS — Z0001 Encounter for general adult medical examination with abnormal findings: Secondary | ICD-10-CM | POA: Diagnosis not present

## 2017-11-24 DIAGNOSIS — Z6823 Body mass index (BMI) 23.0-23.9, adult: Secondary | ICD-10-CM | POA: Diagnosis not present

## 2017-12-24 DIAGNOSIS — Z8551 Personal history of malignant neoplasm of bladder: Secondary | ICD-10-CM | POA: Diagnosis not present

## 2018-02-25 ENCOUNTER — Other Ambulatory Visit: Payer: Self-pay

## 2018-02-25 NOTE — Patient Outreach (Signed)
[place] [facility]) Care Management  02/25/2018  [name] 04-03-1950 [id number]   Medication Adherence call to Mr. Kenneth Kelly patient did not answer Calvin said last pick up was on 11/24/17 for a three month supply, patient is due on Simvastatin 20 mg ,Metformin 1000 and Lisinopril 10 mg. [name] is showing past due under [place].   [place] Management Direct Dial [phone]  Fax [phone] [email or web address]

## 2018-03-20 DIAGNOSIS — Z8551 Personal history of malignant neoplasm of bladder: Secondary | ICD-10-CM | POA: Diagnosis not present

## 2018-06-04 ENCOUNTER — Other Ambulatory Visit: Payer: Self-pay

## 2018-06-04 NOTE — Patient Outreach (Signed)
[place] [facility]) Care Management  06/04/2018  VINEETH FELL 01/12/1951 [id number]   Medication Adherence call to Mr. Everrett Lacasse [facility] Ident[facility] Verify spoke with patient he explain he is still taking 1 tablet daily on Lisinopril 10 mg and Simvastatin 20 mg on Metformin he is taking 1 tablet 2 times daily but said sometimes he forgets to take it he has enough for 10 more days and ask if we can call [facility] an order all 3 medication and will pick up later. Mr. Thorstenson is showing past due under [place].  [place] Management Direct Dial [phone]  Fax [phone] [email or web address]

## 2018-06-25 DIAGNOSIS — Z8551 Personal history of malignant neoplasm of bladder: Secondary | ICD-10-CM | POA: Diagnosis not present

## 2018-07-14 DIAGNOSIS — E119 Type 2 diabetes mellitus without complications: Secondary | ICD-10-CM | POA: Diagnosis not present

## 2018-07-14 DIAGNOSIS — Z0001 Encounter for general adult medical examination with abnormal findings: Secondary | ICD-10-CM | POA: Diagnosis not present

## 2018-07-14 DIAGNOSIS — Z1389 Encounter for screening for other disorder: Secondary | ICD-10-CM | POA: Diagnosis not present

## 2018-07-14 DIAGNOSIS — I10 Essential (primary) hypertension: Secondary | ICD-10-CM | POA: Diagnosis not present

## 2018-11-09 IMAGING — CT CT ABD-PELV W/ CM
3 of 6 series · 15 of 46 positions shown, 17 images · IV contrast (Isovue)
Comparison: None.

CLINICAL DATA: Gross hematuria and back pain for 6 months. No known
injury.

EXAM:
CT ABDOMEN AND PELVIS WITH CONTRAST
TECHNIQUE: Multidetector CT imaging of the abdomen and pelvis was performed
using the standard protocol following bolus administration of
intravenous contrast.
CONTRAST:  100 ml F0VNSS-TMM IOPAMIDOL (F0VNSS-TMM) INJECTION 61%

[Series 2: axial st · axial · 0.76mm/px · z∈[-558,-162]mm · 10 of 97 slices shown, 12 images]
[im 9/97  soft-tissue]
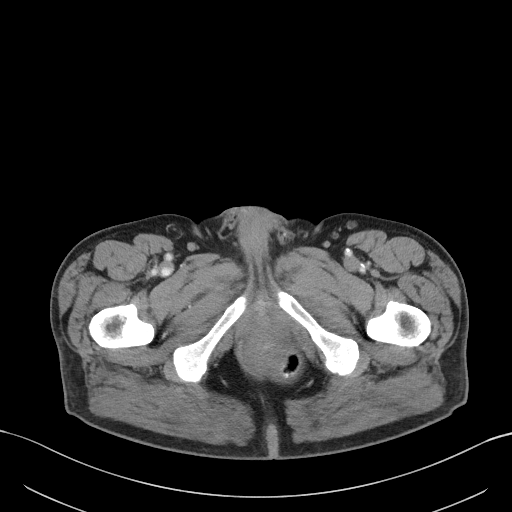
[im 9/97  bone]
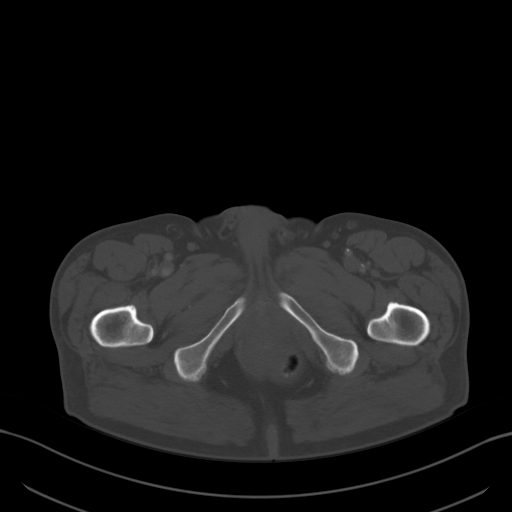
[im 18/97  soft-tissue]
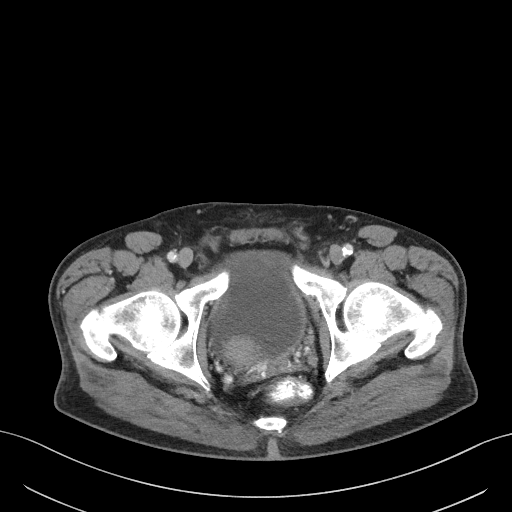
[im 27/97  soft-tissue]
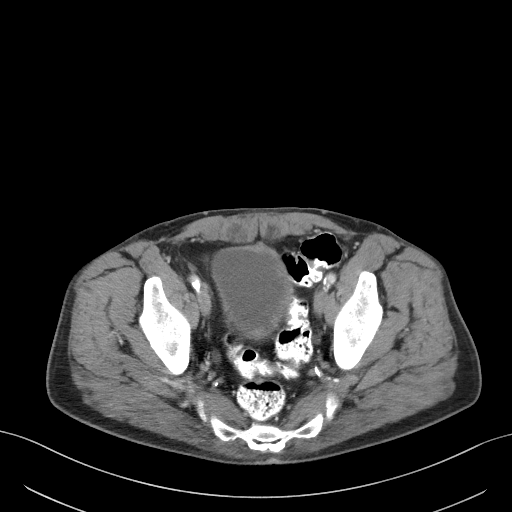
[im 35/97  soft-tissue]
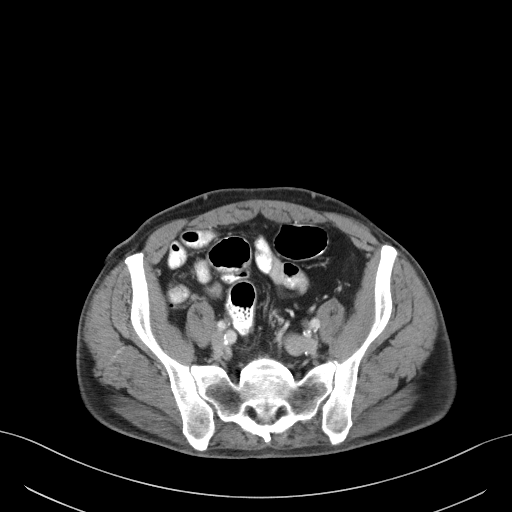
[im 44/97  soft-tissue]
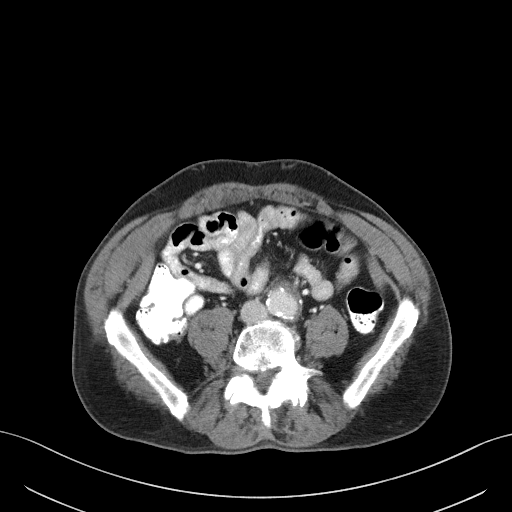
[im 53/97  soft-tissue]
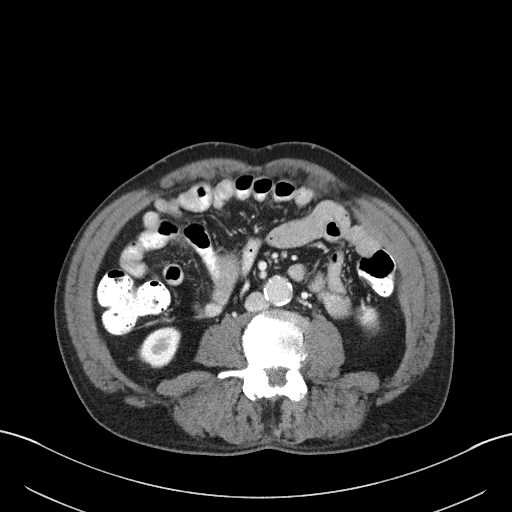
[im 62/97  soft-tissue]
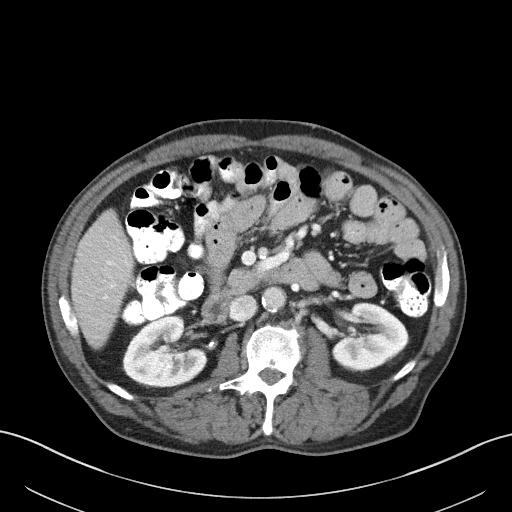
[im 70/97  soft-tissue]
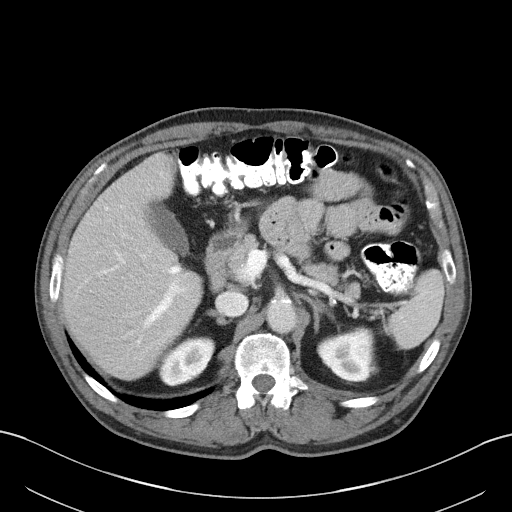
[im 79/97  soft-tissue]
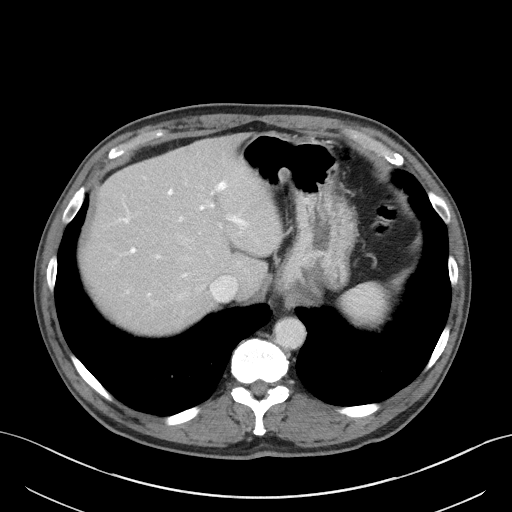
[im 79/97  bone]
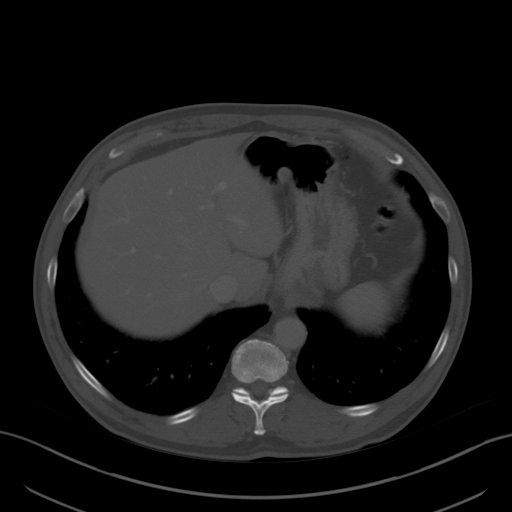
[im 88/97  soft-tissue]
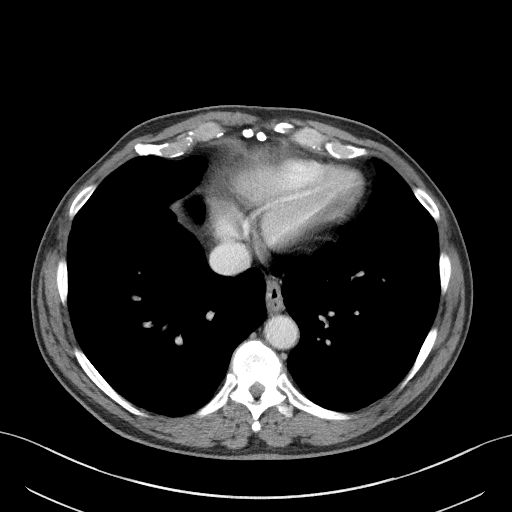

[Series 6: coronal st · coronal · 0.68mm/px · 3 of 101 slices shown]
[im 34/101  soft-tissue]
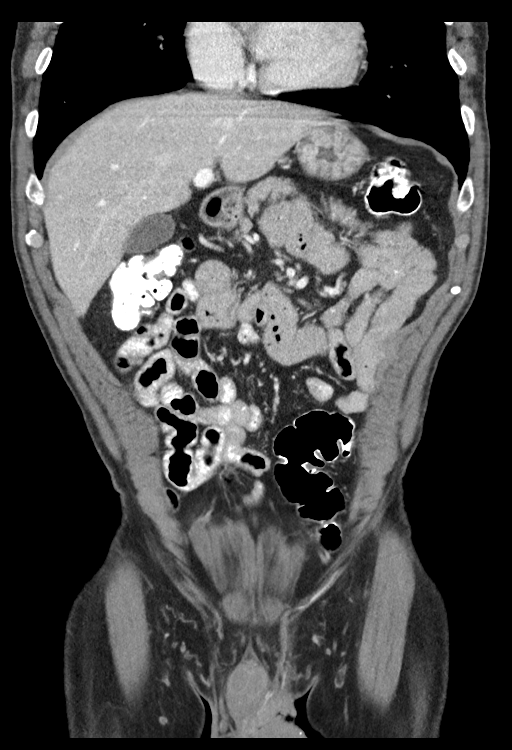
[im 45/101  soft-tissue]
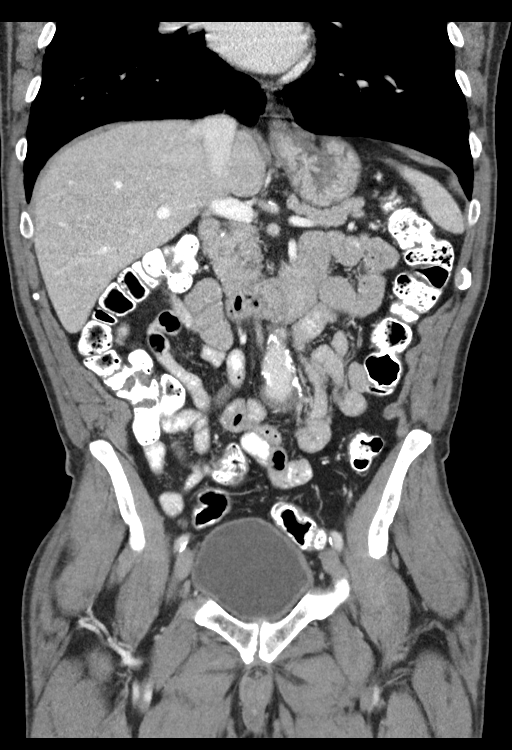
[im 56/101  soft-tissue]
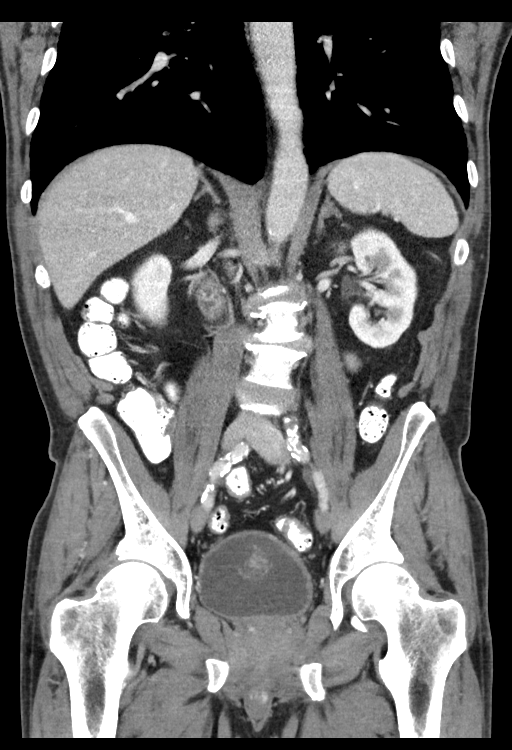

[Series 8: lung bases · axial · 0.76mm/px · z∈[-252,-208]mm · 2 of 37 slices shown]
[im 10/37  bone]
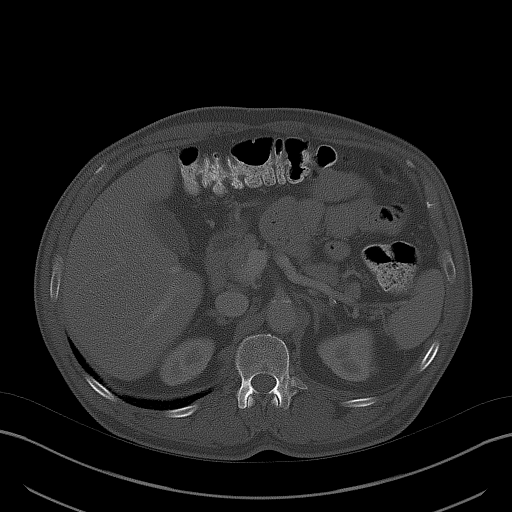
[im 19/37  bone]
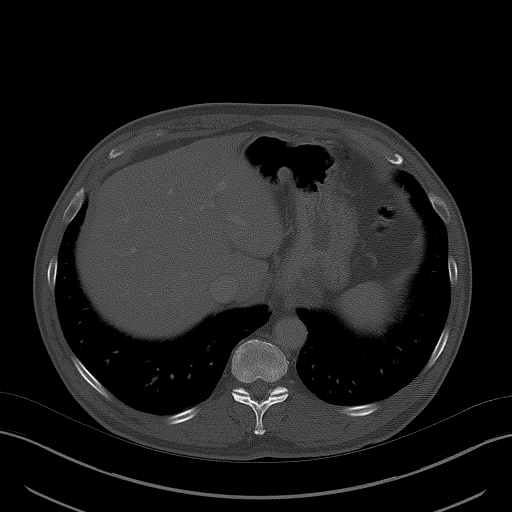

[15 of 46 positions shown; findings below may reference images not displayed]

FINDINGS: Lower chest: Lung bases are clear. No pleural or pericardial
effusion. Heart size is normal. The patient is status post CABG.

Hepatobiliary: A few tiny gravel type stones are seen layering
dependently within the gallbladder. No evidence of cholecystitis.
The liver and biliary tree appear normal.

Pancreas: Unremarkable. No pancreatic ductal dilatation or
surrounding inflammatory changes.

Spleen: Normal in size without focal abnormality.

Adrenals/Urinary Tract: An enhancing mass in the posterior aspect of
the urinary bladder measures 4.6 cm craniocaudal by 5.2 cm
transverse by 3.5 cm AP. The kidneys, ureters and adrenal glands
appear normal.

Stomach/Bowel: Stomach is within normal limits. Appendix appears
normal. No evidence of bowel wall thickening, distention, or
inflammatory changes.

Vascular/Lymphatic: Aortoiliac atherosclerosis is identified.
Infrarenal abdominal aortic aneurysm measures 3.4 cm transverse by
3.1 cm AP. Circumaortic left renal vein is noted.

Reproductive: The prostate gland is mildly enlarged and deviates the
rectum to the left.

Other: No fluid collection.

Musculoskeletal: There is convex left scoliosis with loss of disc
space height and endplate spurring in the mid lumbar spine. The
patient has transitional lumbosacral anatomy.
IMPRESSION: Large enhancing mass in the urinary bladder most consistent with
carcinoma. No evidence of metastatic disease identified.

Mild prostatomegaly.

**An incidental finding of potential clinical significance has been
found. Abdominal aortic aneurysm measuring 3.1 x 3.4 cm Recommend
followup by ultrasound in 3 years. This recommendation follows ACR
consensus guidelines: White Paper of the ACR Incidental Findings
Committee II on Vascular Findings. [HOSPITAL] 6856;
[DATE]**

A few small gallstones are seen.  Negative for cholecystitis.

## 2018-11-26 DIAGNOSIS — E7849 Other hyperlipidemia: Secondary | ICD-10-CM | POA: Diagnosis not present

## 2018-11-26 DIAGNOSIS — I10 Essential (primary) hypertension: Secondary | ICD-10-CM | POA: Diagnosis not present

## 2018-11-26 DIAGNOSIS — Z6823 Body mass index (BMI) 23.0-23.9, adult: Secondary | ICD-10-CM | POA: Diagnosis not present

## 2018-11-26 DIAGNOSIS — E119 Type 2 diabetes mellitus without complications: Secondary | ICD-10-CM | POA: Diagnosis not present

## 2018-11-26 DIAGNOSIS — Z719 Counseling, unspecified: Secondary | ICD-10-CM | POA: Diagnosis not present

## 2018-11-26 DIAGNOSIS — E1151 Type 2 diabetes mellitus with diabetic peripheral angiopathy without gangrene: Secondary | ICD-10-CM | POA: Diagnosis not present

## 2018-12-15 ENCOUNTER — Other Ambulatory Visit: Payer: Self-pay

## 2018-12-15 NOTE — Patient Outreach (Signed)
[place] [facility]) Care Management  12/15/2018  Kenneth Kelly 1950-05-24 [id number]   Medication Adherence call to Mr. Kenneth Kelly Telephone call to Patient regarding Medication Adherence unable to reach patient. Kenneth Kelly is showing past due on Simvastatin 20,Metformin 1000 mg and Lisinopril 10 mg under [place].    [place] Management Direct Dial [phone]  Fax [phone] [email or web address]

## 2018-12-17 DIAGNOSIS — C678 Malignant neoplasm of overlapping sites of bladder: Secondary | ICD-10-CM | POA: Diagnosis not present

## 2018-12-17 DIAGNOSIS — C679 Malignant neoplasm of bladder, unspecified: Secondary | ICD-10-CM | POA: Diagnosis not present

## 2018-12-23 DIAGNOSIS — Z8551 Personal history of malignant neoplasm of bladder: Secondary | ICD-10-CM | POA: Diagnosis not present

## 2019-01-05 ENCOUNTER — Other Ambulatory Visit: Payer: Self-pay

## 2019-01-05 NOTE — Patient Outreach (Signed)
[place] [facility]) Care Management  01/05/2019  GASPARE MILLWEE [birth year] [id number]   Medication Adherence call to Mr. Crystian Timbers Telephone call to Patient regarding Medication Adherence unable to reach patient. Mr. Dacres is showing past due on Simvastatin 20 mg and Lisinopril 10 mg under [place].   [place] Management Direct Dial [phone]  Fax [phone] [email or web address]

## 2019-01-28 ENCOUNTER — Other Ambulatory Visit: Payer: Self-pay

## 2019-01-28 NOTE — Patient Outreach (Signed)
[place] [facility]) Care Management  01/28/2019  [name] 01-Sep-1950 [id number]   Medication Adherence call to Mr. Kenneth Kelly Telephone call to Patient regarding Medication Adherence unable to reach patient Kenneth Kelly is showing past due on Simvastatin 20 mg and Lisinopril 10 mg under [place].   [place] Management Direct Dial [phone]  Fax [phone] [email or web address]

## 2019-02-24 DIAGNOSIS — M109 Gout, unspecified: Secondary | ICD-10-CM | POA: Diagnosis not present

## 2019-02-24 DIAGNOSIS — E119 Type 2 diabetes mellitus without complications: Secondary | ICD-10-CM | POA: Diagnosis not present

## 2019-02-24 DIAGNOSIS — Z6823 Body mass index (BMI) 23.0-23.9, adult: Secondary | ICD-10-CM | POA: Diagnosis not present

## 2019-02-24 DIAGNOSIS — I10 Essential (primary) hypertension: Secondary | ICD-10-CM | POA: Diagnosis not present

## 2019-02-25 ENCOUNTER — Other Ambulatory Visit: Payer: Self-pay

## 2019-02-25 ENCOUNTER — Encounter: Payer: Self-pay | Admitting: Vascular Surgery

## 2019-02-25 ENCOUNTER — Ambulatory Visit: Payer: Medicare Other | Admitting: Vascular Surgery

## 2019-02-25 VITALS — BP 161/80 | HR 52 | Temp 98.0°F | Resp 20 | Ht 63.0 in | Wt 131.0 lb

## 2019-02-25 DIAGNOSIS — I739 Peripheral vascular disease, unspecified: Secondary | ICD-10-CM | POA: Diagnosis not present

## 2019-02-25 DIAGNOSIS — I714 Abdominal aortic aneurysm, without rupture, unspecified: Secondary | ICD-10-CM

## 2019-02-25 NOTE — Progress Notes (Signed)
Referring Physician: Dr Sharilyn Sites  Patient name: Kenneth Kelly MRN: [id number] DOB: 1951-01-09 Sex: male  REASON FOR CONSULT: Abdominal aortic aneurysm HPI: Kenneth Kelly is a 69 y.o. male, for evaluation of abdominal aortic aneurysm.  The aneurysm was first found in 2017 and measured 3.4 cm in diameter.  He recently had a repeat ultrasound performed at [facility] which showed the aneurysm was 3.7 cm November 2020.  He has no family history of abdominal aortic aneurysm.  He has no abdominal or back pain.  He does not describe claudication symptoms.  He is on aspirin and a statin.  However, he admits to being noncompliant intermittently with his aspirin.  He does smoke.  Spent 3 min today regarding smoking cessation counseling.  Other medical problems include story of bladder cancer, coronary artery disease, diabetes all of which have been stable.  Past Medical History:  Diagnosis Date  . Abdominal aortic aneurysm (AAA) [facility]) followed by cardiologist-- dr Bronson Ing   per last CT 01-12-2016  Infrarenal aortic aneurysm 3.4cm  . Arthritis    Hip  . Bladder tumor   . CAD (coronary artery disease)    CARDIOLOGIST-  DR Bronson Ing---  HX CABG X3 08-15-1999  . First degree AV block   . Gout   . History of bladder cancer urologist-  dr Jeffie Pollock   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  . History of fracture of pelvis   . Hypertension   . 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 Sinus bradycardia on ECG   . Type 2 diabetes mellitus ([place])    Past Surgical History:  Procedure Laterality Date  . CARDIAC CATHETERIZATION  07-20-1999  dr wall   significant 2v CAD, total occlusion LAD an 80% LCFx neither amenable to PCI  . CARDIOVASCULAR STRESS TEST  04-23-2016  dr Bronson Ing ([place])   Low risk nuclear study w/ small, mild intensity, reversible mid to apical inferior defect that is consistent to ischemia but no diagnotic ST segment changes to indicate  ischemia/  normal LV function and wall motion ,  nuclear stress ef 57%  . CORONARY ARTERY BYPASS GRAFT  08/15/1999   dr gerhardt [facility]   x3, LIMA to left circumflex, SVG to RCA, and right internal mammary artery graft to the LAD  . CYSTOSCOPY WITH BIOPSY N/A 04/08/2017   Procedure: CYSTOSCOPY;  Surgeon: [name], MD;  Location: [facility];  Service: Urology;  Laterality: N/A;  . CYSTOSCOPY WITH STENT PLACEMENT Right 01/23/2016   Procedure: CYSTOSCOPY WITH STENT PLACEMENT;  Surgeon: [name], MD;  Location: [facility];  Service: Urology;  Laterality: Right;  . KNEE ARTHROSCOPY Right 2001  approx.  Marland Kitchen POSTERIOR LAMINECTOMY / DECOMPRESSION LUMBAR SPINE  09-01-2009   [facility]  . TRANSURETHRAL RESECTION OF BLADDER TUMOR WITH MITOMYCIN-C Bilateral 01/23/2016   Procedure: CYSTOSCOPY ,TRANSURETHRAL RESECTION OF BLADDER TUMOR;  Surgeon: [name], MD;  Location: [facility];  Service: Urology;  Laterality: Bilateral;    Family History  Problem Relation Age of Onset  . Heart disease Father   . Heart attack Brother   . Heart disease Brother   . Heart disease Brother     SOCIAL HISTORY: Social History   Socioeconomic History  . Marital status: Divorced    Spouse name: Not on file  . Number of children: Not on file  . Years of education: Not on file  . Highest education level:  Not on file  Occupational History  . Not on file  Tobacco Use  . Smoking status: Current Some Day Smoker    Packs/day: 0.50    Years: 56.00    Pack years: 28.00    Types: Cigarettes  . Smokeless tobacco: Never Used  Substance and Sexual Activity  . Alcohol use: Yes    Comment: rare  . Drug use: No  . Sexual activity: Not on file  Other Topics Concern  . Not on file  Social History Narrative  . Not on file   Social Determinants of Health   Financial Resource Strain:   . Difficulty of Paying Living Expenses: Not on file  Food Insecurity:   . Worried About Charity fundraiser in the Last Year: Not on  file  . Ran Out of Food in the Last Year: Not on file  Transportation Needs:   . Lack of Transportation (Medical): Not on file  . Lack of Transportation (Non-Medical): Not on file  Physical Activity:   . Days of Exercise per Week: Not on file  . Minutes of Exercise per Session: Not on file  Stress:   . Feeling of Stress : Not on file  Social Connections:   . Frequency of Communication with Friends and Family: Not on file  . Frequency of Social Gatherings with Friends and Family: Not on file  . Attends Religious Services: Not on file  . Active Member of Clubs or Organizations: Not on file  . Attends Archivist Meetings: Not on file  . Marital Status: Not on file  Intimate Partner Violence:   . Fear of Current or Ex-Partner: Not on file  . Emotionally Abused: Not on file  . Physically Abused: Not on file  . Sexually Abused: Not on file    No Known Allergies  Current Outpatient Medications  Medication Sig Dispense Refill  . acetaminophen (TYLENOL) 500 MG tablet Take 1,000-1,500 mg by mouth every 6 (six) hours as needed (Pain).    Marland Kitchen amLODipine (NORVASC) 10 MG tablet Take 10 mg by mouth every morning.     Marland Kitchen aspirin EC 81 MG tablet Take 1 tablet (81 mg total) by mouth daily.    Marland Kitchen ibuprofen (ADVIL,MOTRIN) 200 MG tablet Take 400-600 mg by mouth every 6 (six) hours as needed (pain).    Marland Kitchen lisinopril (PRINIVIL,ZESTRIL) 10 MG tablet Take 10 mg by mouth every morning.    . metFORMIN (GLUCOPHAGE) 1000 MG tablet Take 1,000 mg by mouth 2 (two) times daily with a meal.    . metoprolol (LOPRESSOR) 50 MG tablet Take 50 mg by mouth 2 (two) times daily.      . nitroGLYCERIN (NITROSTAT) 0.4 MG SL tablet Place 1 tablet (0.4 mg total) under the tongue every 5 (five) minutes as needed for chest pain. 25 tablet 3  . simvastatin (ZOCOR) 20 MG tablet Take 20 mg by mouth every morning.     No current facility-administered medications for this visit.    ROS:   General:  No weight loss, Fever,  chills  HEENT: No recent headaches, no nasal bleeding, no visual changes, no sore throat  Neurologic: No dizziness, blackouts, seizures. No recent symptoms of stroke or mini- stroke. No recent episodes of slurred speech, or temporary blindness.  Cardiac: No recent episodes of chest pain/pressure, no shortness of breath at rest.  No shortness of breath with exertion.  Denies history of atrial fibrillation or irregular heartbeat  Vascular: No history of rest pain in  feet.  No history of claudication.  No history of non-healing ulcer, No history of DVT   Pulmonary: No home oxygen, no productive cough, no hemoptysis,  No asthma or wheezing  Musculoskeletal:  [ ]  Arthritis, [ ]  Low back pain,  [ ]  Joint pain  Hematologic:No history of hypercoagulable state.  No history of easy bleeding.  No history of anemia  Gastrointestinal: No hematochezia or melena,  No gastroesophageal reflux, no trouble swallowing  Urinary: [ ]  chronic Kidney disease, [ ]  on HD - [ ]  MWF or [ ]  TTHS, [ ]  Burning with urination, [ ]  Frequent urination, [ ]  Difficulty urinating;   Skin: No rashes  Psychological: No history of anxiety,  No history of depression   Physical Examination   Vitals:   02/25/19 1324  BP: (!) 161/80  Pulse: (!) 52  Resp: 20  Temp: 98 F (36.7 C)  SpO2: 94%  Weight: 131 lb (59.4 kg)  Height: 5\' 3"  (1.6 m)    General:  Alert and oriented, no acute distress HEENT: Normal Neck: No bruit or JVD Pulmonary: Clear to auscultation bilaterally Cardiac: Regular Rate and Rhythm without murmur Abdomen: Soft, non-tender, non-distended, no mass, no scars Skin: No rash Extremity Pulses:  2+ radial, brachial, femoral, dorsalis pedis, posterior tibial pulses bilaterally Musculoskeletal: No deformity or edema  Neurologic: Upper and lower extremity motor 5/5 and symmetric  DATA:  I reviewed the recent ultrasound from [place] which showed a 3.7 cm infrarenal abdominal aortic  aneurysm  ASSESSMENT: 3.7 cm infrarenal abdominal aortic aneurysm by ultrasound currently asymptomatic  Peripheral arterial disease 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  He currently is asymptomatic with no symptoms of claudication rest pain or tissue loss.   PLAN: 1.  Patient was told to resume his aspirin since he only intermittently takes it currently.  He will be 81 mg once daily.  He will continue his statin  2.  Follow-up ultrasound abdominal aorta 1 year.  Consider repair if it reaches a diameter 5.5 cm  3.  We will obtain ABIs at his next office visit for baseline since he did not have a normal pulse exam today.  As long as he remains asymptomatic no intervention currently planned for this   Ruta Hinds, MD [facility] Office: [phone] Pager: [phone]

## 2019-02-26 ENCOUNTER — Other Ambulatory Visit: Payer: Self-pay | Admitting: *Deleted

## 2019-02-26 DIAGNOSIS — I714 Abdominal aortic aneurysm, without rupture, unspecified: Secondary | ICD-10-CM

## 2019-02-26 DIAGNOSIS — I739 Peripheral vascular disease, unspecified: Secondary | ICD-10-CM

## 2019-03-07 DIAGNOSIS — E119 Type 2 diabetes mellitus without complications: Secondary | ICD-10-CM | POA: Diagnosis not present

## 2019-03-07 DIAGNOSIS — I2510 Atherosclerotic heart disease of native coronary artery without angina pectoris: Secondary | ICD-10-CM | POA: Diagnosis not present

## 2019-03-07 DIAGNOSIS — E7849 Other hyperlipidemia: Secondary | ICD-10-CM | POA: Diagnosis not present

## 2019-03-07 DIAGNOSIS — I10 Essential (primary) hypertension: Secondary | ICD-10-CM | POA: Diagnosis not present

## 2019-05-05 DIAGNOSIS — I25708 Atherosclerosis of coronary artery bypass graft(s), unspecified, with other forms of angina pectoris: Secondary | ICD-10-CM | POA: Diagnosis not present

## 2019-05-05 DIAGNOSIS — E119 Type 2 diabetes mellitus without complications: Secondary | ICD-10-CM | POA: Diagnosis not present

## 2019-05-05 DIAGNOSIS — I744 Embolism and thrombosis of arteries of extremities, unspecified: Secondary | ICD-10-CM | POA: Diagnosis not present

## 2019-05-05 DIAGNOSIS — Z720 Tobacco use: Secondary | ICD-10-CM | POA: Diagnosis not present

## 2019-05-06 ENCOUNTER — Ambulatory Visit: Payer: Medicare Other | Attending: Internal Medicine

## 2019-05-06 DIAGNOSIS — Z23 Encounter for immunization: Secondary | ICD-10-CM

## 2019-05-06 NOTE — Progress Notes (Signed)
   Covid-19 Vaccination Clinic  Name:  AAMARI BENDANA    MRN: [id number] DOB: [birth year]  05/06/2019  Mr. Hughes was observed post Covid-19 immunization for 15 minutes without incident. He was provided with Vaccine Information Sheet and instruction to access the V-Safe system.   Mr. Leonti was instructed to call 911 with any severe reactions post vaccine: Marland Kitchen Difficulty breathing  . Swelling of face and throat  . A fast heartbeat  . A bad rash all over body  . Dizziness and weakness   Immunizations Administered    Name Date Dose VIS Date Route   Moderna COVID-19 Vaccine 05/06/2019 10:37 AM 0.5 mL 01/05/2019 Intramuscular   Manufacturer: Moderna   Lot: [id number]   [id number]

## 2019-06-03 ENCOUNTER — Ambulatory Visit: Payer: Medicare Other | Attending: Internal Medicine

## 2019-06-03 DIAGNOSIS — Z23 Encounter for immunization: Secondary | ICD-10-CM

## 2019-06-03 NOTE — Progress Notes (Signed)
   Covid-19 Vaccination Clinic  Name:  Kenneth Kelly    MRN: [id number] DOB: 01/23/1951  06/03/2019  [name] was observed post Covid-19 immunization for 15 minutes without incident. He was provided with Vaccine Information Sheet and instruction to access the V-Safe system.   [name] was instructed to call 911 with any severe reactions post vaccine: Marland Kitchen Difficulty breathing  . Swelling of face and throat  . A fast heartbeat  . A bad rash all over body  . Dizziness and weakness   Immunizations Administered    Name Date Dose VIS Date Route   Moderna COVID-19 Vaccine 06/03/2019 10:12 AM 0.5 mL 01/2019 Intramuscular   Manufacturer: Moderna   Lot: [id number]   [id number]

## 2019-07-05 DIAGNOSIS — I25708 Atherosclerosis of coronary artery bypass graft(s), unspecified, with other forms of angina pectoris: Secondary | ICD-10-CM | POA: Diagnosis not present

## 2019-07-05 DIAGNOSIS — I744 Embolism and thrombosis of arteries of extremities, unspecified: Secondary | ICD-10-CM | POA: Diagnosis not present

## 2019-07-05 DIAGNOSIS — E119 Type 2 diabetes mellitus without complications: Secondary | ICD-10-CM | POA: Diagnosis not present

## 2019-07-05 DIAGNOSIS — Z720 Tobacco use: Secondary | ICD-10-CM | POA: Diagnosis not present

## 2019-07-14 DIAGNOSIS — Z1389 Encounter for screening for other disorder: Secondary | ICD-10-CM | POA: Diagnosis not present

## 2019-07-14 DIAGNOSIS — Z0000 Encounter for general adult medical examination without abnormal findings: Secondary | ICD-10-CM | POA: Diagnosis not present

## 2019-07-14 DIAGNOSIS — E119 Type 2 diabetes mellitus without complications: Secondary | ICD-10-CM | POA: Diagnosis not present

## 2019-07-14 DIAGNOSIS — Z6823 Body mass index (BMI) 23.0-23.9, adult: Secondary | ICD-10-CM | POA: Diagnosis not present

## 2019-08-04 DIAGNOSIS — I25708 Atherosclerosis of coronary artery bypass graft(s), unspecified, with other forms of angina pectoris: Secondary | ICD-10-CM | POA: Diagnosis not present

## 2019-08-04 DIAGNOSIS — Z720 Tobacco use: Secondary | ICD-10-CM | POA: Diagnosis not present

## 2019-08-04 DIAGNOSIS — I744 Embolism and thrombosis of arteries of extremities, unspecified: Secondary | ICD-10-CM | POA: Diagnosis not present

## 2019-08-04 DIAGNOSIS — E119 Type 2 diabetes mellitus without complications: Secondary | ICD-10-CM | POA: Diagnosis not present

## 2019-08-20 DIAGNOSIS — M25522 Pain in left elbow: Secondary | ICD-10-CM | POA: Diagnosis not present

## 2019-08-20 DIAGNOSIS — M1991 Primary osteoarthritis, unspecified site: Secondary | ICD-10-CM | POA: Diagnosis not present

## 2019-08-20 DIAGNOSIS — K219 Gastro-esophageal reflux disease without esophagitis: Secondary | ICD-10-CM | POA: Diagnosis not present

## 2019-08-20 DIAGNOSIS — I10 Essential (primary) hypertension: Secondary | ICD-10-CM | POA: Diagnosis not present

## 2019-08-20 DIAGNOSIS — Z6823 Body mass index (BMI) 23.0-23.9, adult: Secondary | ICD-10-CM | POA: Diagnosis not present

## 2019-10-25 DIAGNOSIS — Z6823 Body mass index (BMI) 23.0-23.9, adult: Secondary | ICD-10-CM | POA: Diagnosis not present

## 2019-10-25 DIAGNOSIS — Z23 Encounter for immunization: Secondary | ICD-10-CM | POA: Diagnosis not present

## 2019-10-25 DIAGNOSIS — M545 Low back pain: Secondary | ICD-10-CM | POA: Diagnosis not present

## 2019-10-28 ENCOUNTER — Encounter (INDEPENDENT_AMBULATORY_CARE_PROVIDER_SITE_OTHER): Payer: Self-pay | Admitting: *Deleted

## 2019-12-04 DIAGNOSIS — Z720 Tobacco use: Secondary | ICD-10-CM | POA: Diagnosis not present

## 2019-12-04 DIAGNOSIS — I25708 Atherosclerosis of coronary artery bypass graft(s), unspecified, with other forms of angina pectoris: Secondary | ICD-10-CM | POA: Diagnosis not present

## 2019-12-04 DIAGNOSIS — E119 Type 2 diabetes mellitus without complications: Secondary | ICD-10-CM | POA: Diagnosis not present

## 2019-12-04 DIAGNOSIS — I744 Embolism and thrombosis of arteries of extremities, unspecified: Secondary | ICD-10-CM | POA: Diagnosis not present

## 2019-12-09 DIAGNOSIS — Z6822 Body mass index (BMI) 22.0-22.9, adult: Secondary | ICD-10-CM | POA: Diagnosis not present

## 2019-12-09 DIAGNOSIS — E118 Type 2 diabetes mellitus with unspecified complications: Secondary | ICD-10-CM | POA: Diagnosis not present

## 2019-12-09 DIAGNOSIS — I10 Essential (primary) hypertension: Secondary | ICD-10-CM | POA: Diagnosis not present

## 2019-12-09 DIAGNOSIS — I2510 Atherosclerotic heart disease of native coronary artery without angina pectoris: Secondary | ICD-10-CM | POA: Diagnosis not present

## 2019-12-09 DIAGNOSIS — M1991 Primary osteoarthritis, unspecified site: Secondary | ICD-10-CM | POA: Diagnosis not present

## 2019-12-22 DIAGNOSIS — Z8551 Personal history of malignant neoplasm of bladder: Secondary | ICD-10-CM | POA: Diagnosis not present

## 2020-02-04 DIAGNOSIS — E119 Type 2 diabetes mellitus without complications: Secondary | ICD-10-CM | POA: Diagnosis not present

## 2020-02-04 DIAGNOSIS — I25708 Atherosclerosis of coronary artery bypass graft(s), unspecified, with other forms of angina pectoris: Secondary | ICD-10-CM | POA: Diagnosis not present

## 2020-02-04 DIAGNOSIS — Z720 Tobacco use: Secondary | ICD-10-CM | POA: Diagnosis not present

## 2020-02-04 DIAGNOSIS — I744 Embolism and thrombosis of arteries of extremities, unspecified: Secondary | ICD-10-CM | POA: Diagnosis not present

## 2020-02-05 DIAGNOSIS — I639 Cerebral infarction, unspecified: Secondary | ICD-10-CM

## 2020-02-05 HISTORY — DX: Cerebral infarction, unspecified: I63.9

## 2020-04-13 DIAGNOSIS — E118 Type 2 diabetes mellitus with unspecified complications: Secondary | ICD-10-CM | POA: Diagnosis not present

## 2020-04-13 DIAGNOSIS — I10 Essential (primary) hypertension: Secondary | ICD-10-CM | POA: Diagnosis not present

## 2020-04-13 DIAGNOSIS — E782 Mixed hyperlipidemia: Secondary | ICD-10-CM | POA: Diagnosis not present

## 2020-04-13 DIAGNOSIS — I25708 Atherosclerosis of coronary artery bypass graft(s), unspecified, with other forms of angina pectoris: Secondary | ICD-10-CM | POA: Diagnosis not present

## 2020-04-13 DIAGNOSIS — Z125 Encounter for screening for malignant neoplasm of prostate: Secondary | ICD-10-CM | POA: Diagnosis not present

## 2020-04-13 DIAGNOSIS — E7849 Other hyperlipidemia: Secondary | ICD-10-CM | POA: Diagnosis not present

## 2020-04-13 DIAGNOSIS — Z23 Encounter for immunization: Secondary | ICD-10-CM | POA: Diagnosis not present

## 2020-04-13 DIAGNOSIS — Z6823 Body mass index (BMI) 23.0-23.9, adult: Secondary | ICD-10-CM | POA: Diagnosis not present

## 2020-04-13 DIAGNOSIS — Z1389 Encounter for screening for other disorder: Secondary | ICD-10-CM | POA: Diagnosis not present

## 2020-04-13 DIAGNOSIS — I714 Abdominal aortic aneurysm, without rupture: Secondary | ICD-10-CM | POA: Diagnosis not present

## 2020-04-13 DIAGNOSIS — I739 Peripheral vascular disease, unspecified: Secondary | ICD-10-CM | POA: Diagnosis not present

## 2020-04-13 DIAGNOSIS — F17290 Nicotine dependence, other tobacco product, uncomplicated: Secondary | ICD-10-CM | POA: Diagnosis not present

## 2020-04-26 DIAGNOSIS — C44629 Squamous cell carcinoma of skin of left upper limb, including shoulder: Secondary | ICD-10-CM | POA: Diagnosis not present

## 2020-05-29 DIAGNOSIS — Z85828 Personal history of other malignant neoplasm of skin: Secondary | ICD-10-CM | POA: Diagnosis not present

## 2020-05-29 DIAGNOSIS — Z08 Encounter for follow-up examination after completed treatment for malignant neoplasm: Secondary | ICD-10-CM | POA: Diagnosis not present

## 2020-06-03 DIAGNOSIS — E119 Type 2 diabetes mellitus without complications: Secondary | ICD-10-CM | POA: Diagnosis not present

## 2020-06-03 DIAGNOSIS — I744 Embolism and thrombosis of arteries of extremities, unspecified: Secondary | ICD-10-CM | POA: Diagnosis not present

## 2020-06-03 DIAGNOSIS — I25708 Atherosclerosis of coronary artery bypass graft(s), unspecified, with other forms of angina pectoris: Secondary | ICD-10-CM | POA: Diagnosis not present

## 2020-06-03 DIAGNOSIS — Z720 Tobacco use: Secondary | ICD-10-CM | POA: Diagnosis not present

## 2020-07-21 ENCOUNTER — Emergency Department (HOSPITAL_COMMUNITY): Payer: Medicare HMO

## 2020-07-21 ENCOUNTER — Inpatient Hospital Stay (HOSPITAL_COMMUNITY)
Admission: EM | Admit: 2020-07-21 | Discharge: 2020-07-25 | DRG: 042 | Disposition: A | Discharge status: Home / Self Care | Payer: Medicare HMO | Attending: Internal Medicine | Admitting: Internal Medicine

## 2020-07-21 ENCOUNTER — Inpatient Hospital Stay (HOSPITAL_COMMUNITY): Payer: Medicare HMO

## 2020-07-21 ENCOUNTER — Encounter (HOSPITAL_COMMUNITY): Payer: Self-pay

## 2020-07-21 ENCOUNTER — Other Ambulatory Visit: Payer: Self-pay

## 2020-07-21 DIAGNOSIS — R4702 Dysphasia: Secondary | ICD-10-CM | POA: Diagnosis present

## 2020-07-21 DIAGNOSIS — Z7984 Long term (current) use of oral hypoglycemic drugs: Secondary | ICD-10-CM

## 2020-07-21 DIAGNOSIS — R531 Weakness: Secondary | ICD-10-CM | POA: Diagnosis present

## 2020-07-21 DIAGNOSIS — E785 Hyperlipidemia, unspecified: Secondary | ICD-10-CM | POA: Diagnosis present

## 2020-07-21 DIAGNOSIS — F17210 Nicotine dependence, cigarettes, uncomplicated: Secondary | ICD-10-CM | POA: Diagnosis present

## 2020-07-21 DIAGNOSIS — C679 Malignant neoplasm of bladder, unspecified: Secondary | ICD-10-CM | POA: Diagnosis not present

## 2020-07-21 DIAGNOSIS — I639 Cerebral infarction, unspecified: Principal | ICD-10-CM | POA: Diagnosis present

## 2020-07-21 DIAGNOSIS — M961 Postlaminectomy syndrome, not elsewhere classified: Secondary | ICD-10-CM | POA: Diagnosis present

## 2020-07-21 DIAGNOSIS — G936 Cerebral edema: Secondary | ICD-10-CM | POA: Diagnosis not present

## 2020-07-21 DIAGNOSIS — I672 Cerebral atherosclerosis: Secondary | ICD-10-CM | POA: Diagnosis not present

## 2020-07-21 DIAGNOSIS — Z20822 Contact with and (suspected) exposure to covid-19: Secondary | ICD-10-CM | POA: Diagnosis present

## 2020-07-21 DIAGNOSIS — Z8673 Personal history of transient ischemic attack (TIA), and cerebral infarction without residual deficits: Secondary | ICD-10-CM | POA: Diagnosis not present

## 2020-07-21 DIAGNOSIS — Z79899 Other long term (current) drug therapy: Secondary | ICD-10-CM

## 2020-07-21 DIAGNOSIS — I708 Atherosclerosis of other arteries: Secondary | ICD-10-CM | POA: Diagnosis present

## 2020-07-21 DIAGNOSIS — I10 Essential (primary) hypertension: Secondary | ICD-10-CM | POA: Diagnosis not present

## 2020-07-21 DIAGNOSIS — I6503 Occlusion and stenosis of bilateral vertebral arteries: Secondary | ICD-10-CM | POA: Diagnosis not present

## 2020-07-21 DIAGNOSIS — Z951 Presence of aortocoronary bypass graft: Secondary | ICD-10-CM

## 2020-07-21 DIAGNOSIS — I714 Abdominal aortic aneurysm, without rupture, unspecified: Secondary | ICD-10-CM

## 2020-07-21 DIAGNOSIS — Z7982 Long term (current) use of aspirin: Secondary | ICD-10-CM

## 2020-07-21 DIAGNOSIS — R29702 NIHSS score 2: Secondary | ICD-10-CM | POA: Diagnosis present

## 2020-07-21 DIAGNOSIS — I2510 Atherosclerotic heart disease of native coronary artery without angina pectoris: Secondary | ICD-10-CM | POA: Diagnosis present

## 2020-07-21 DIAGNOSIS — M109 Gout, unspecified: Secondary | ICD-10-CM | POA: Diagnosis present

## 2020-07-21 DIAGNOSIS — I63441 Cerebral infarction due to embolism of right cerebellar artery: Secondary | ICD-10-CM | POA: Diagnosis not present

## 2020-07-21 DIAGNOSIS — I6389 Other cerebral infarction: Secondary | ICD-10-CM | POA: Diagnosis not present

## 2020-07-21 DIAGNOSIS — R270 Ataxia, unspecified: Secondary | ICD-10-CM | POA: Diagnosis present

## 2020-07-21 DIAGNOSIS — I25119 Atherosclerotic heart disease of native coronary artery with unspecified angina pectoris: Secondary | ICD-10-CM | POA: Diagnosis not present

## 2020-07-21 DIAGNOSIS — E1159 Type 2 diabetes mellitus with other circulatory complications: Secondary | ICD-10-CM

## 2020-07-21 DIAGNOSIS — I440 Atrioventricular block, first degree: Secondary | ICD-10-CM | POA: Diagnosis present

## 2020-07-21 DIAGNOSIS — Z8249 Family history of ischemic heart disease and other diseases of the circulatory system: Secondary | ICD-10-CM | POA: Diagnosis not present

## 2020-07-21 DIAGNOSIS — F17200 Nicotine dependence, unspecified, uncomplicated: Secondary | ICD-10-CM | POA: Diagnosis not present

## 2020-07-21 DIAGNOSIS — E1142 Type 2 diabetes mellitus with diabetic polyneuropathy: Secondary | ICD-10-CM | POA: Diagnosis present

## 2020-07-21 DIAGNOSIS — E119 Type 2 diabetes mellitus without complications: Secondary | ICD-10-CM

## 2020-07-21 DIAGNOSIS — R351 Nocturia: Secondary | ICD-10-CM | POA: Diagnosis present

## 2020-07-21 DIAGNOSIS — R220 Localized swelling, mass and lump, head: Secondary | ICD-10-CM | POA: Diagnosis not present

## 2020-07-21 DIAGNOSIS — I6523 Occlusion and stenosis of bilateral carotid arteries: Secondary | ICD-10-CM | POA: Diagnosis not present

## 2020-07-21 LAB — COMPREHENSIVE METABOLIC PANEL
ALT: 20 U/L (ref 0–44)
AST: 22 U/L (ref 15–41)
Albumin: 5 g/dL (ref 3.5–5.0)
Alkaline Phosphatase: 87 U/L (ref 38–126)
Anion gap: 14 (ref 5–15)
BUN: 28 mg/dL — ABNORMAL HIGH (ref 8–23)
CO2: 26 mmol/L (ref 22–32)
Calcium: 10 mg/dL (ref 8.9–10.3)
Chloride: 101 mmol/L (ref 98–111)
Creatinine, Ser: 0.94 mg/dL (ref 0.61–1.24)
GFR, Estimated: >60 mL/min (ref >60)
Glucose, Bld: 293 mg/dL — ABNORMAL HIGH (ref 70–99)
Potassium: 4.2 mmol/L (ref 3.5–5.1)
Sodium: 141 mmol/L (ref 135–145)
Total Bilirubin: 0.5 mg/dL (ref 0.3–1.2)
Total Protein: 8.8 g/dL — ABNORMAL HIGH (ref 6.5–8.1)

## 2020-07-21 LAB — CBC WITH DIFFERENTIAL/PLATELET
Abs Immature Granulocytes: 0.05 10*3/uL (ref 0.00–0.07)
Basophils Absolute: 0 10*3/uL (ref 0.0–0.1)
Basophils Relative: 0 %
Eosinophils Absolute: 0 10*3/uL (ref 0.0–0.5)
Eosinophils Relative: 0 %
HCT: 45.2 % (ref 39.0–52.0)
Hemoglobin: 15.4 g/dL (ref 13.0–17.0)
Immature Granulocytes: 1 %
Lymphocytes Relative: 7 %
Lymphs Abs: 0.8 10*3/uL (ref 0.7–4.0)
MCH: 33.1 pg (ref 26.0–34.0)
MCHC: 34.1 g/dL (ref 30.0–36.0)
MCV: 97.2 fL (ref 80.0–100.0)
Monocytes Absolute: 0.5 10*3/uL (ref 0.1–1.0)
Monocytes Relative: 5 %
Neutro Abs: 9.2 10*3/uL — ABNORMAL HIGH (ref 1.7–7.7)
Neutrophils Relative %: 87 %
Platelets: 221 10*3/uL (ref 150–400)
RBC: 4.65 MIL/uL (ref 4.22–5.81)
RDW: 13.2 % (ref 11.5–15.5)
WBC: 10.5 10*3/uL (ref 4.0–10.5)
nRBC: 0 % (ref 0.0–0.2)

## 2020-07-21 LAB — RESP PANEL BY RT-PCR (FLU A&B, COVID) ARPGX2
Influenza A by PCR: NEGATIVE
Influenza B by PCR: NEGATIVE
SARS Coronavirus 2 by RT PCR: NEGATIVE

## 2020-07-21 LAB — GLUCOSE, CAPILLARY: Glucose-Capillary: 140 mg/dL — ABNORMAL HIGH (ref 70–99)

## 2020-07-21 LAB — CBG MONITORING, ED: Glucose-Capillary: 191 mg/dL — ABNORMAL HIGH (ref 70–99)

## 2020-07-21 MED ORDER — ACETAMINOPHEN 650 MG RE SUPP: 650.0000 mg | Freq: Four times a day (QID) | RECTAL | Status: DC | PRN

## 2020-07-21 MED ORDER — INSULIN ASPART 100 UNIT/ML IJ SOLN
0.0000 [IU] | Freq: Three times a day (TID) | INTRAMUSCULAR | Status: DC
  Administered 2020-07-21: 2 [IU] via SUBCUTANEOUS
  Administered 2020-07-22: 3 [IU] via SUBCUTANEOUS
  Administered 2020-07-22 – 2020-07-23 (×2): 2 [IU] via SUBCUTANEOUS
  Filled 2020-07-21: qty 1

## 2020-07-21 MED ORDER — ONDANSETRON HCL 4 MG/2ML IJ SOLN
4.0000 mg | Freq: Four times a day (QID) | INTRAMUSCULAR | Status: DC | PRN
  Filled 2020-07-21: qty 2

## 2020-07-21 MED ORDER — GADOBUTROL 1 MMOL/ML IV SOLN
7.0000 mL | Freq: Once | INTRAVENOUS | Status: AC | PRN
  Administered 2020-07-21: 14:00:00 7 mL via INTRAVENOUS

## 2020-07-21 MED ORDER — ASPIRIN 325 MG PO TABS
325.0000 mg | ORAL_TABLET | Freq: Once | ORAL | Status: AC
  Administered 2020-07-21: 325 mg via ORAL
  Filled 2020-07-21: qty 1

## 2020-07-21 MED ORDER — ACETAMINOPHEN 325 MG PO TABS
650.0000 mg | ORAL_TABLET | Freq: Four times a day (QID) | ORAL | Status: DC | PRN
  Administered 2020-07-22 – 2020-07-24 (×4): 650 mg via ORAL
  Filled 2020-07-21 (×4): qty 2

## 2020-07-21 MED ORDER — ENOXAPARIN SODIUM 40 MG/0.4ML IJ SOSY: 40.0000 mg | PREFILLED_SYRINGE | INTRAMUSCULAR | Status: DC

## 2020-07-21 MED ORDER — LABETALOL HCL 5 MG/ML IV SOLN
5.0000 mg | INTRAVENOUS | Status: DC | PRN
  Filled 2020-07-21: qty 4

## 2020-07-21 MED ORDER — NITROGLYCERIN 0.4 MG SL SUBL: 0.4000 mg | SUBLINGUAL_TABLET | SUBLINGUAL | Status: DC | PRN

## 2020-07-21 MED ORDER — GABAPENTIN 300 MG PO CAPS
900.0000 mg | ORAL_CAPSULE | Freq: Two times a day (BID) | ORAL | Status: DC
  Administered 2020-07-21 – 2020-07-25 (×8): 900 mg via ORAL
  Filled 2020-07-21 (×8): qty 3

## 2020-07-21 MED ORDER — ONDANSETRON HCL 4 MG/2ML IJ SOLN
4.0000 mg | Freq: Once | INTRAMUSCULAR | Status: AC
  Administered 2020-07-21: 16:00:00 4 mg via INTRAVENOUS
  Filled 2020-07-21: qty 2

## 2020-07-21 MED ORDER — NICOTINE 14 MG/24HR TD PT24: 14.0000 mg | MEDICATED_PATCH | Freq: Every day | TRANSDERMAL | Status: DC | PRN

## 2020-07-21 MED ORDER — METFORMIN HCL 500 MG PO TABS
500.0000 mg | ORAL_TABLET | Freq: Two times a day (BID) | ORAL | Status: DC
  Administered 2020-07-23 – 2020-07-25 (×4): 500 mg via ORAL
  Filled 2020-07-21 (×4): qty 1

## 2020-07-21 MED ORDER — HYDROMORPHONE HCL 1 MG/ML IJ SOLN
0.5000 mg | Freq: Once | INTRAMUSCULAR | Status: AC
  Administered 2020-07-21: 12:00:00 0.5 mg via INTRAVENOUS
  Filled 2020-07-21: qty 1

## 2020-07-21 MED ORDER — IOHEXOL 350 MG/ML SOLN
75.0000 mL | Freq: Once | INTRAVENOUS | Status: AC | PRN
  Administered 2020-07-21: 15:00:00 75 mL via INTRAVENOUS

## 2020-07-21 MED ORDER — ONDANSETRON HCL 4 MG/2ML IJ SOLN
4.0000 mg | Freq: Once | INTRAMUSCULAR | Status: AC
  Administered 2020-07-21: 12:00:00 4 mg via INTRAVENOUS
  Filled 2020-07-21: qty 2

## 2020-07-21 MED ORDER — SIMVASTATIN 20 MG PO TABS
20.0000 mg | ORAL_TABLET | Freq: Every day | ORAL | Status: DC
  Administered 2020-07-22 – 2020-07-24 (×3): 20 mg via ORAL
  Filled 2020-07-21 (×3): qty 1

## 2020-07-21 MED ORDER — ONDANSETRON HCL 4 MG PO TABS: 4.0000 mg | ORAL_TABLET | Freq: Four times a day (QID) | ORAL | Status: DC | PRN

## 2020-07-21 MED ORDER — SODIUM CHLORIDE 0.9 % IV BOLUS
500.0000 mL | Freq: Once | INTRAVENOUS | Status: AC
  Administered 2020-07-21: 12:00:00 500 mL via INTRAVENOUS

## 2020-07-21 MED ORDER — ASPIRIN 81 MG PO CHEW: 324.0000 mg | CHEWABLE_TABLET | Freq: Once | ORAL | Status: DC

## 2020-07-21 MED ORDER — STROKE: EARLY STAGES OF RECOVERY BOOK
Freq: Once | Status: DC
  Filled 2020-07-21 (×2): qty 1

## 2020-07-21 MED ORDER — ASPIRIN EC 81 MG PO TBEC
81.0000 mg | DELAYED_RELEASE_TABLET | Freq: Every day | ORAL | Status: DC
  Administered 2020-07-22 – 2020-07-25 (×4): 81 mg via ORAL
  Filled 2020-07-21 (×4): qty 1

## 2020-07-21 MED ORDER — ASPIRIN 300 MG RE SUPP: 300.0000 mg | Freq: Once | RECTAL | Status: AC

## 2020-07-21 MED ORDER — ENOXAPARIN SODIUM 40 MG/0.4ML IJ SOSY
40.0000 mg | PREFILLED_SYRINGE | INTRAMUSCULAR | Status: DC
  Administered 2020-07-22 – 2020-07-24 (×3): 40 mg via SUBCUTANEOUS
  Filled 2020-07-21 (×3): qty 0.4

## 2020-07-21 NOTE — ED Provider Notes (Signed)
[facility] EMERGENCY DEPARTMENT Provider Note   CSN: [id number] Arrival date & time: 07/21/20  1059     History Chief Complaint  Patient presents with   Emesis    Kenneth Kelly is a 70 y.o. male.  Patient states 2 days ago he started with a headache and vomiting.  He also feels weak and unsteady  The history is provided by the patient and medical records. No language interpreter was used.  Emesis Severity:  Mild Duration: 2 days. Quality:  Undigested food Able to tolerate:  Liquids Progression:  Partially resolved Chronicity:  New Recent urination:  Normal Context: not post-tussive   Relieved by:  Nothing Worsened by:  Nothing Ineffective treatments:  None tried Associated symptoms: no abdominal pain, no cough, no diarrhea and no headaches       Past Medical History:  Diagnosis Date   Abdominal aortic aneurysm (AAA) ([place]) followed by cardiologist-- dr Bronson Ing   per last CT 01-12-2016  Infrarenal aortic aneurysm 3.4cm   Arthritis    Hip   Bladder tumor    CAD (coronary artery disease)    CARDIOLOGIST-  DR Bronson Ing---  HX CABG X3 08-15-1999   First degree AV block    Gout    History of bladder cancer urologist-  dr Jeffie Pollock   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   History of fracture of pelvis    Hypertension    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   LIMA to LCFx, SVG to RCA, RIMA to LAD   Sinus bradycardia on ECG    Type 2 diabetes mellitus ([place])     Patient Active Problem List   Diagnosis Date Noted   Diabetes ([place]) 02/26/2016   Bladder cancer ([place]) 01/23/2016   DYSLIPIDEMIA 02/21/2009   OTHER SPEC FORMS CHRONIC ISCHEMIC HEART DISEASE 02/21/2009   SHORTNESS OF BREATH 02/21/2009   TOBACCO ABUSE 02/13/2009   HYPERTENSION 02/13/2009   CAD 02/13/2009    Past Surgical History:  Procedure Laterality Date   CARDIAC CATHETERIZATION  07-20-1999  dr wall   significant 2v CAD, total occlusion LAD an 80% LCFx neither amenable to PCI   CARDIOVASCULAR  STRESS TEST  04-23-2016  dr Bronson Ing ([place])   Low risk nuclear study w/ small, mild intensity, reversible mid to apical inferior defect that is consistent to ischemia but no diagnotic ST segment changes to indicate ischemia/  normal LV function and wall motion ,  nuclear stress ef 57%   CORONARY ARTERY BYPASS GRAFT  08/15/1999   dr gerhardt [facility]   x3, LIMA to left circumflex, SVG to RCA, and right internal mammary artery graft to the LAD   CYSTOSCOPY WITH BIOPSY N/A 04/08/2017   Procedure: CYSTOSCOPY;  Surgeon: [name], MD;  Location: [facility];  Service: Urology;  Laterality: N/A;   CYSTOSCOPY WITH STENT PLACEMENT Right 01/23/2016   Procedure: CYSTOSCOPY WITH STENT PLACEMENT;  Surgeon: [name], MD;  Location: [facility];  Service: Urology;  Laterality: Right;   KNEE ARTHROSCOPY Right 2001  approx.   POSTERIOR LAMINECTOMY / DECOMPRESSION LUMBAR SPINE  09-01-2009   [facility]   TRANSURETHRAL RESECTION OF BLADDER TUMOR WITH MITOMYCIN-C Bilateral 01/23/2016   Procedure: CYSTOSCOPY ,TRANSURETHRAL RESECTION OF BLADDER TUMOR;  Surgeon: [name], MD;  Location: [facility];  Service: Urology;  Laterality: Bilateral;       Family History  Problem Relation Age of Onset   Heart disease Father    Heart attack Brother    Heart disease Brother  Heart disease Brother     Social History   Tobacco Use   Smoking status: Some Days    Packs/day: 0.50    Years: 56.00    Pack years: 28.00    Types: Cigarettes   Smokeless tobacco: Never  Vaping Use   Vaping Use: Never used  Substance Use Topics   Alcohol use: Yes    Comment: rare   Drug use: No    Home Medications Prior to Admission medications   Medication Sig Start Date End Date Taking? Authorizing Provider  acetaminophen (TYLENOL) 500 MG tablet Take 1,000-1,500 mg by mouth every 6 (six) hours as needed (Pain).    [provider]  amLODipine (NORVASC) 10 MG tablet Take 10 mg by mouth every morning.     [provider]  aspirin EC 81 MG tablet Take 1 tablet (81 mg total) by mouth daily. Patient not taking: Reported on 02/25/2019 02/27/16   Herminio Commons, MD  HYDROcodone-acetaminophen [facility]) 10-325 MG tablet Take 1 tablet by mouth 4 (four) times daily as needed for moderate pain. 02/24/19   [provider]  ibuprofen (ADVIL,MOTRIN) 200 MG tablet Take 400-600 mg by mouth every 6 (six) hours as needed (pain).    [provider]  lisinopril (PRINIVIL,ZESTRIL) 10 MG tablet Take 10 mg by mouth every morning.    [provider]  metFORMIN (GLUCOPHAGE) 1000 MG tablet Take 1,000 mg by mouth 2 (two) times daily with a meal.    [provider]  metoprolol (LOPRESSOR) 50 MG tablet Take 50 mg by mouth 2 (two) times daily.      [provider]  nitroGLYCERIN (NITROSTAT) 0.4 MG SL tablet Place 1 tablet (0.4 mg total) under the tongue every 5 (five) minutes as needed for chest pain. 02/27/16   Herminio Commons, MD  simvastatin (ZOCOR) 20 MG tablet Take 20 mg by mouth every morning.    [provider]    Allergies    Patient has no known allergies.  Review of Systems   Review of Systems  Constitutional:  Negative for appetite change and fatigue.  HENT:  Negative for congestion, ear discharge and sinus pressure.   Eyes:  Negative for discharge.  Respiratory:  Negative for cough.   Cardiovascular:  Negative for chest pain.  Gastrointestinal:  Positive for vomiting. Negative for abdominal pain and diarrhea.  Genitourinary:  Negative for frequency and hematuria.  Musculoskeletal:  Negative for back pain.  Skin:  Negative for rash.  Neurological:  Negative for seizures and headaches.       General weakness walking  Psychiatric/Behavioral:  Negative for hallucinations.    Physical Exam Updated Vital Signs BP (!) 181/85   Pulse 78   Temp 98.2 F (36.8 C) (Oral)   Resp 16   Ht 5\' 3"  (1.6 m)   Wt 58.1 kg   SpO2 97%   BMI 22.67 kg/m    Physical Exam Vitals and nursing note reviewed.  Constitutional:      Appearance: He is well-developed.  HENT:     Head: Normocephalic.     Nose: Nose normal.  Eyes:     General: No scleral icterus.    Conjunctiva/sclera: Conjunctivae normal.  Neck:     Thyroid: No thyromegaly.  Cardiovascular:     Rate and Rhythm: Normal rate and regular rhythm.     Heart sounds: No murmur heard.   No friction rub. No gallop.  Pulmonary:     Breath sounds: No stridor.  No wheezing or rales.  Chest:     Chest wall: No tenderness.  Abdominal:     General: There is no distension.     Tenderness: There is no abdominal tenderness. There is no rebound.  Musculoskeletal:        General: Normal range of motion.     Cervical back: Neck supple.  Lymphadenopathy:     Cervical: No cervical adenopathy.  Skin:    Findings: No erythema or rash.  Neurological:     Mental Status: He is alert and oriented to person, place, and time.     Motor: No abnormal muscle tone.     Coordination: Coordination normal.  Psychiatric:        Behavior: Behavior normal.    ED Results / Procedures / Treatments   Labs (all labs ordered are listed, but only abnormal results are displayed) Labs Reviewed  CBC WITH DIFFERENTIAL/PLATELET - Abnormal; Notable for the following components:      Result Value   Neutro Abs 9.2 (*)    All other components within normal limits  COMPREHENSIVE METABOLIC PANEL - Abnormal; Notable for the following components:   Glucose, Bld 293 (*)    BUN 28 (*)    Total Protein 8.8 (*)    All other components within normal limits    EKG None  Radiology CT Angio Head W or Wo Contrast  Result Date: 07/21/2020 CLINICAL DATA:  Vomiting with acute infarct on MRI EXAM: CT ANGIOGRAPHY HEAD AND NECK TECHNIQUE: Multidetector CT imaging of the head and neck was performed using the standard protocol during bolus administration of intravenous contrast. Multiplanar CT image reconstructions and MIPs  were obtained to evaluate the vascular anatomy. Carotid stenosis measurements (when applicable) are obtained utilizing NASCET criteria, using the distal internal carotid diameter as the denominator. CONTRAST:  57mL OMNIPAQUE IOHEXOL 350 MG/ML SOLN COMPARISON:  None. FINDINGS: CTA NECK Aortic arch: Calcified plaque along the arch and patent great vessel origins. No high-grade proximal subclavian artery stenosis. Right carotid system: Patent. Calcified plaque along the common carotid with less than 50% stenosis. Mixed plaque the proximal internal carotid causing 70% stenosis. Left carotid system: Patent. Calcified plaque along the common carotid causing less than 50% stenosis. Mixed plaque along the proximal internal carotid causing 60% stenosis. Additional calcified plaque along the distal cervical ICA with less than 50% stenosis. Vertebral arteries: Patent. Left vertebral artery is dominant. No stenosis. Skeleton: Degenerative changes of the cervical spine, greatest at C4-C5 and C5-C6. Other neck: Unremarkable. Upper chest: Emphysema. Review of the MIP images confirms the above findings CTA HEAD Anterior circulation: Intracranial internal carotid arteries are patent with diffuse mixed but primarily calcified plaque with moderate to marked stenosis. Anterior and middle cerebral arteries are patent. There is fenestration of the proximal left M1 MCA. Posterior circulation: Intracranial vertebral arteries are patent. There is calcified plaque on the left greater than right. Moderate to marked stenosis on the left. Right PICA origin is patent with occlusion approximately 11 mm beyond the origin. Left PICA origin is patent. Superior cerebellar artery origins are patent. Right posterior communicating artery is present. Possible small left posterior communicating artery. Posterior cerebral arteries are patent with atherosclerotic irregularity. There is mild to moderate stenosis on the left. Venous sinuses: Patent as allowed  by contrast bolus timing. Review of the MIP images confirms the above findings IMPRESSION: Occlusion of the right PICA approximately 11 mm beyond the origin. Patent vertebral arteries. No stenosis in the neck. Calcified plaque intracranially on  the left greater than right. Plaque along the common carotids causes less than 50% stenosis. Plaque at the right ICA origin causes 70% stenosis. Plaque at the left ICA origin causes 60% stenosis. Plaque along the intracranial internal carotids causes moderate to marked stenosis. Emphysema. Electronically Signed   By: Macy Mis M.D.   On: 07/21/2020 15:04   CT Head Wo Contrast  Addendum Date: 07/21/2020   ADDENDUM REPORT: 07/21/2020 13:48 ADDENDUM: Suspected posterior circulation infarct may extend slightly anterior to the fourth ventricle as well. Electronically Signed   By: Lowella Grip III M.D.   On: 07/21/2020 13:48   Result Date: 07/21/2020 CLINICAL DATA:  Episodes of severe vomiting EXAM: CT HEAD WITHOUT CONTRAST TECHNIQUE: Contiguous axial images were obtained from the base of the skull through the vertex without intravenous contrast. COMPARISON:  None. FINDINGS: Brain: There is mild diffuse atrophy. There is apparent vasogenic edema in the medial aspect of the posteroinferior right cerebellum extending to the level of the posterior fourth ventricle on the right. Elsewhere there is decreased attenuation in the anterior rostrum of corpus callosum on the right consistent prior infarct in this area. There are white matter infarcts in the left superior centrum semiovale as well as a prior infarct in the posterosuperior left parietal lobe. Periventricular small vessel disease in the centra semiovale bilaterally also noted. No well-defined mass evident. No hemorrhage, extra-axial fluid collection, or midline shift. Vascular: No hyperdense vessel evident. There is calcification in each distal vertebral artery and carotid siphon region. Skull: The bony calvarium  appears intact. Sinuses/Orbits: There is mucosal thickening in several ethmoid air cells. Other visualized paranasal sinuses are clear. Orbits appear symmetric bilaterally. Other: Mastoid air cells are clear. IMPRESSION: 1. Suspected acute infarct involving a portion of the right posteroinferior cerebellar artery distribution. There is edema with sulcal effacement in this area extending medially to the inferior aspect of the fourth ventricle on the right. An underlying mass in this area is a less likely differential consideration. Correlation with MR pre and post-contrast to further evaluate may well be warranted. 2. Prior infarct in the superior posterior left parietal lobe. Prior infarct in rostrum of corpus callosum on the right. White matter infarcts noted in the superior left centrum semiovale which appear chronic. There is periventricular small vessel disease. 3.  No hemorrhage or extra-axial fluid. 4.  Multiple foci of arterial vascular calcification noted. 5.  Mucosal thickening noted in several ethmoid air cells. Electronically Signed: By: Lowella Grip III M.D. On: 07/21/2020 12:23   CT Angio Neck W and/or Wo Contrast  Result Date: 07/21/2020 CLINICAL DATA:  Vomiting with acute infarct on MRI EXAM: CT ANGIOGRAPHY HEAD AND NECK TECHNIQUE: Multidetector CT imaging of the head and neck was performed using the standard protocol during bolus administration of intravenous contrast. Multiplanar CT image reconstructions and MIPs were obtained to evaluate the vascular anatomy. Carotid stenosis measurements (when applicable) are obtained utilizing NASCET criteria, using the distal internal carotid diameter as the denominator. CONTRAST:  37mL OMNIPAQUE IOHEXOL 350 MG/ML SOLN COMPARISON:  None. FINDINGS: CTA NECK Aortic arch: Calcified plaque along the arch and patent great vessel origins. No high-grade proximal subclavian artery stenosis. Right carotid system: Patent. Calcified plaque along the common carotid  with less than 50% stenosis. Mixed plaque the proximal internal carotid causing 70% stenosis. Left carotid system: Patent. Calcified plaque along the common carotid causing less than 50% stenosis. Mixed plaque along the proximal internal carotid causing 60% stenosis. Additional calcified plaque along the distal  cervical ICA with less than 50% stenosis. Vertebral arteries: Patent. Left vertebral artery is dominant. No stenosis. Skeleton: Degenerative changes of the cervical spine, greatest at C4-C5 and C5-C6. Other neck: Unremarkable. Upper chest: Emphysema. Review of the MIP images confirms the above findings CTA HEAD Anterior circulation: Intracranial internal carotid arteries are patent with diffuse mixed but primarily calcified plaque with moderate to marked stenosis. Anterior and middle cerebral arteries are patent. There is fenestration of the proximal left M1 MCA. Posterior circulation: Intracranial vertebral arteries are patent. There is calcified plaque on the left greater than right. Moderate to marked stenosis on the left. Right PICA origin is patent with occlusion approximately 11 mm beyond the origin. Left PICA origin is patent. Superior cerebellar artery origins are patent. Right posterior communicating artery is present. Possible small left posterior communicating artery. Posterior cerebral arteries are patent with atherosclerotic irregularity. There is mild to moderate stenosis on the left. Venous sinuses: Patent as allowed by contrast bolus timing. Review of the MIP images confirms the above findings IMPRESSION: Occlusion of the right PICA approximately 11 mm beyond the origin. Patent vertebral arteries. No stenosis in the neck. Calcified plaque intracranially on the left greater than right. Plaque along the common carotids causes less than 50% stenosis. Plaque at the right ICA origin causes 70% stenosis. Plaque at the left ICA origin causes 60% stenosis. Plaque along the intracranial internal  carotids causes moderate to marked stenosis. Emphysema. Electronically Signed   By: Macy Mis M.D.   On: 07/21/2020 15:04   MR Brain W and Wo Contrast  Result Date: 07/21/2020 CLINICAL DATA:  Vomiting, abnormal CT head EXAM: MRI HEAD WITHOUT AND WITH CONTRAST TECHNIQUE: Multiplanar, multiecho pulse sequences of the brain and surrounding structures were obtained without and with intravenous contrast. CONTRAST:  38mL GADAVIST GADOBUTROL 1 MMOL/ML IV SOLN COMPARISON:  Correlation made with same day CT head FINDINGS: Brain: There is reduced diffusion in the right cerebellum involving the PICA territory. A punctate focus of reduced diffusion is also present in the right occipital lobe. Small subacute to chronic right frontal cortical infarct. Chronic left parietal larger than frontal infarcts. Small chronic infarct of the right ventral corpus callosum. Additional patchy T2 hyperintensity in the supratentorial white matter is nonspecific but may reflect mild chronic microvascular ischemic changes. There is a 4 mm dural-based enhancing lesion along the left temporal convexity likely reflecting a tiny meningioma (series 21, image 15). A focus of susceptibility hypointensity is present in the region of the left perimesencephalic cistern, midbrain, and thalamus. Unclear if parenchymal or extra-axial. May reflect chronic blood products or mineralization. No significant mass effect. There is no hydrocephalus or extra-axial fluid collection. No abnormal parenchymal enhancement. Vascular: Major vessel flow voids at the skull base are preserved. Skull and upper cervical spine: Normal marrow signal is preserved. Sinuses/Orbits: Paranasal sinuses are aerated. Orbits are unremarkable. Other: Sella is unremarkable. Patchy right mastoid fluid opacification. IMPRESSION: Large acute infarct of the right PICA territory. Punctate acute right occipital infarct. Small subacute to chronic right frontal cortical infarct. Chronic  infarcts of left frontoparietal lobes and right ventral corpus callosum. Mild chronic microvascular ischemic changes. Tiny left temporal convexity meningioma. Electronically Signed   By: Macy Mis M.D.   On: 07/21/2020 14:49    Procedures Procedures   Medications Ordered in ED Medications  aspirin chewable tablet 324 mg (has no administration in time range)  sodium chloride 0.9 % bolus 500 mL (0 mLs Intravenous Stopped 07/21/20 1433)  HYDROmorphone (DILAUDID) injection  0.5 mg (0.5 mg Intravenous Given 07/21/20 1151)  ondansetron (ZOFRAN) injection 4 mg (4 mg Intravenous Given 07/21/20 1152)  gadobutrol (GADAVIST) 1 MMOL/ML injection 7 mL (7 mLs Intravenous Contrast Given 07/21/20 1422)  iohexol (OMNIPAQUE) 350 MG/ML injection 75 mL (75 mLs Intravenous Contrast Given 07/21/20 1431)  ondansetron (ZOFRAN) injection 4 mg (4 mg Intravenous Given 07/21/20 1539)    ED Course  I have reviewed the triage vital signs and the nursing notes.  Pertinent labs & imaging results that were available during my care of the patient were reviewed by me and considered in my medical decision making (see chart for details).   CRITICAL CARE Performed by: Milton Ferguson Total critical care time: 45 minutes Critical care time was exclusive of separately billable procedures and treating other patients. Critical care was necessary to treat or prevent imminent or life-threatening deterioration. Critical care was time spent personally by me on the following activities: development of treatment plan with patient and/or surrogate as well as nursing, discussions with consultants, evaluation of patient's response to treatment, examination of patient, obtaining history from patient or surrogate, ordering and performing treatments and interventions, ordering and review of laboratory studies, ordering and review of radiographic studies, pulse oximetry and re-evaluation of patient's condition.  MDM Rules/Calculators/A&P                           Patient with an acute stroke that approximately 35 hours old.  He will be admitted to [facility] hospital under the medical service with neurology consult Final Clinical Impression(s) / ED Diagnoses Final diagnoses:  Cerebellar stroke, acute [facility])    Rx / DC Orders ED Discharge Orders     None        Milton Ferguson, MD 07/24/20 1722

## 2020-07-21 NOTE — H&P (Signed)
History and Physical    Kenneth Kelly WGN:[id number] DOB: [birth year] DOA: 07/21/2020  PCP: Sharilyn Sites, MD  Patient coming from: Home.  I have personally briefly reviewed patient's old medical records in [place]  Chief Complaint: Nausea and vomiting.  HPI: Kenneth Kelly is a 70 y.o. male with medical history significant of AAA, osteoarthritis of the hip, bladder tumor, CAD,/CABG x3, first-degree AV block, sinus bradycardia, gout with history of gouty arthritis of multiple sites, history of fractured pelvis, hyperlipidemia, hypertension, type II DM who is coming to the emergency department with complaints of nausea associated with multiple episodes of emesis, generalized weakness, unsteady gait yesterday morning which was followed by frontal headache later in the evening.  He denies fever, chills, sore throat, rhinorrhea, wheezing or hemoptysis.  No chest pain, palpitations, diaphoresis, PND, orthopnea or pitting edema of the lower extremities.  No abdominal pain, diarrhea, constipation, melena or hematochezia.  Denies dysuria, frequency or hematuria.  He has nocturia once a night.  Denies polyuria, polydipsia, polyphagia or blurred vision.  ED Course: Initial vital signs were temperature 98.2 F, pulse 90, respiration 18, BP 204/92 mmHg O2 sat 98% on room air.  The patient 500 mL of normal saline bolus, received aspirin and Zofran 4 mg IVP x2, in the emergency department.  Lab work: CBC showed a white count of 10.5, hemoglobin 15.4 g/dL platelets 221.  CMP shows a glucose of 293 BUN was 28 mg/dL.  His total protein is 8.8 g/dL.  The rest of the CMP values are within expected range.  Imaging: CT head without contrast showed suspected acute infarct involving a portion of the right posterior inferior cerebral artery distribution.  There is edema with sulcal effacement in this area.  MRI of brain with and without contrast confirmed that there was a large acute infarct in the right PICA  territory.  Also seen punctate acute right occipital infarct.  Small subacute to chronic right frontal cortical infarct there were chronic infarcts of the left frontoparietal lobes and right ventral carpus Calio some.  Mild chronic microvascular ischemic changes.  There is a tiny left temporal convexity meningioma.  Please see images and full radiology report for further detail.  Review of Systems: As per HPI otherwise all other systems reviewed and are negative.  Past Medical History:  Diagnosis Date   Abdominal aortic aneurysm (AAA) [facility] followed by cardiologist-- dr Bronson Ing   per last CT 01-12-2016  Infrarenal aortic aneurysm 3.4cm   Arthritis    Hip   Bladder tumor    CAD (coronary artery disease)    CARDIOLOGIST-  DR Bronson Ing---  HX CABG X3 08-15-1999   First degree AV block    Gout    History of bladder cancer urologist-  dr Jeffie Pollock   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   History of fracture of pelvis    Hyperlipidemia 02/21/2009   Qualifier: Diagnosis of  By: Via LPN, Jeani Hawking     Hypertension    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   LIMA to LCFx, SVG to RCA, RIMA to LAD   Sinus bradycardia on ECG    Type 2 diabetes mellitus [facility])     Past Surgical History:  Procedure Laterality Date   CARDIAC CATHETERIZATION  07-20-1999  dr wall   significant 2v CAD, total occlusion LAD an 80% LCFx neither amenable to PCI   CARDIOVASCULAR STRESS TEST  04-23-2016  dr Bronson Ing ()   Low risk nuclear study w/ small, mild  intensity, reversible mid to apical inferior defect that is consistent to ischemia but no diagnotic ST segment changes to indicate ischemia/  normal LV function and wall motion ,  nuclear stress ef 57%   CORONARY ARTERY BYPASS GRAFT  08/15/1999   dr gerhardt [facility]   x3, LIMA to left circumflex, SVG to RCA, and right internal mammary artery graft to the LAD   CYSTOSCOPY WITH BIOPSY N/A 04/08/2017   Procedure: CYSTOSCOPY;  Surgeon: [name], MD;  Location: [facility];  Service: Urology;  Laterality: N/A;   CYSTOSCOPY WITH STENT PLACEMENT Right 01/23/2016   Procedure: CYSTOSCOPY WITH STENT PLACEMENT;  Surgeon: [name], MD;  Location: [facility];  Service: Urology;  Laterality: Right;   KNEE ARTHROSCOPY Right 2001  approx.   POSTERIOR LAMINECTOMY / DECOMPRESSION LUMBAR SPINE  09-01-2009   [facility]   TRANSURETHRAL RESECTION OF BLADDER TUMOR WITH MITOMYCIN-C Bilateral 01/23/2016   Procedure: CYSTOSCOPY ,TRANSURETHRAL RESECTION OF BLADDER TUMOR;  Surgeon: [name], MD;  Location: [facility];  Service: Urology;  Laterality: Bilateral;    Social History  reports that he has been smoking cigarettes. He has a 28.00 pack-year smoking history. He has never used smokeless tobacco. He reports current alcohol use. He reports that he does not use drugs.  No Known Allergies  Family History  Problem Relation Age of Onset   Heart disease Father    Heart attack Brother    Heart disease Brother    Heart disease Brother    Prior to Admission medications   Medication Sig Start Date End Date Taking? Authorizing Provider  acetaminophen (TYLENOL) 500 MG tablet Take 1,000-1,500 mg by mouth every 6 (six) hours as needed (Pain).   Yes [provider]  amLODipine (NORVASC) 10 MG tablet Take 10 mg by mouth every morning.    Yes [provider]  gabapentin (NEURONTIN) 300 MG capsule Take 900 mg by mouth 2 (two) times daily.   Yes [provider]  ibuprofen (ADVIL,MOTRIN) 200 MG tablet Take 400-600 mg by mouth every 6 (six) hours as needed (pain).   Yes [provider]  lisinopril (PRINIVIL,ZESTRIL) 10 MG tablet Take 10 mg by mouth every morning.   Yes [provider]  metFORMIN (GLUCOPHAGE) 1000 MG tablet Take 500 mg by mouth 2 (two) times daily with a meal.   Yes [provider]  metoprolol (LOPRESSOR) 50 MG tablet Take 50 mg by mouth 2 (two) times daily.     Yes [provider]  simvastatin (ZOCOR) 20  MG tablet Take 20 mg by mouth every morning.   Yes [provider]  aspirin EC 81 MG tablet Take 1 tablet (81 mg total) by mouth daily. Patient not taking: No sig reported 02/27/16   Herminio Commons, MD  nitroGLYCERIN (NITROSTAT) 0.4 MG SL tablet Place 1 tablet (0.4 mg total) under the tongue every 5 (five) minutes as needed for chest pain. 02/27/16   Herminio Commons, MD   Physical Exam: Vitals:   07/21/20 1530 07/21/20 1539 07/21/20 1600 07/21/20 1630  BP: (!) 181/85 (!) 181/85 (!) 199/90 (!) 211/88  Pulse: 76 78 78 78  Resp:  16    Temp:      TempSrc:      SpO2: 94% 97% 97% 97%  Weight:      Height:       Constitutional: NAD, calm, comfortable Eyes: PERRL, lids and conjunctivae normal ENMT: Mucous membranes are moist. Posterior pharynx clear of any exudate or  lesions. Neck: normal, supple, no masses, no thyromegaly Respiratory: clear to auscultation bilaterally, no wheezing, no crackles. Normal respiratory effort. No accessory muscle use.  Cardiovascular: Regular rate and rhythm, no murmurs / rubs / gallops. No extremity edema. 2+ pedal pulses. No carotid bruits.  Abdomen: No distention.  Bowel sounds positive.  Soft, no tenderness, no masses palpated. No hepatosplenomegaly.  Musculoskeletal: Mild generalized weakness. no clubbing / cyanosis. Good ROM, no contractures. Normal muscle tone.  Skin: no rashes, lesions, ulcers on very limited dermatological examination. Neurologic:  CN 2-12 grossly intact. Sensation intact, DTR normal. Strength 5/5 in all 4.  Deferred gait examination. Psychiatric: Normal judgment and insight. Alert and oriented x 3. Normal mood.   Labs on Admission: I have personally reviewed following labs and imaging studies  CBC: Recent Labs  Lab 07/21/20 1135  WBC 10.5  NEUTROABS 9.2*  HGB 15.4  HCT 45.2  MCV 97.2  PLT 867    Basic Metabolic Panel: Recent Labs  Lab 07/21/20 1135  NA 141  K 4.2  CL 101  CO2 26  GLUCOSE 293*  BUN  28*  CREATININE 0.94  CALCIUM 10.0    GFR: Estimated Creatinine Clearance: 58 mL/min (by C-G formula based on SCr of 0.94 mg/dL).  Liver Function Tests: Recent Labs  Lab 07/21/20 1135  AST 22  ALT 20  ALKPHOS 87  BILITOT 0.5  PROT 8.8*  ALBUMIN 5.0    Urine analysis: No results found for: COLORURINE, APPEARANCEUR, LABSPEC, PHURINE, GLUCOSEU, HGBUR, BILIRUBINUR, KETONESUR, PROTEINUR, UROBILINOGEN, NITRITE, LEUKOCYTESUR  Radiological Exams on Admission: CT Angio Head W or Wo Contrast  Result Date: 07/21/2020 CLINICAL DATA:  Vomiting with acute infarct on MRI EXAM: CT ANGIOGRAPHY HEAD AND NECK TECHNIQUE: Multidetector CT imaging of the head and neck was performed using the standard protocol during bolus administration of intravenous contrast. Multiplanar CT image reconstructions and MIPs were obtained to evaluate the vascular anatomy. Carotid stenosis measurements (when applicable) are obtained utilizing NASCET criteria, using the distal internal carotid diameter as the denominator. CONTRAST:  98mL OMNIPAQUE IOHEXOL 350 MG/ML SOLN COMPARISON:  None. FINDINGS: CTA NECK Aortic arch: Calcified plaque along the arch and patent great vessel origins. No high-grade proximal subclavian artery stenosis. Right carotid system: Patent. Calcified plaque along the common carotid with less than 50% stenosis. Mixed plaque the proximal internal carotid causing 70% stenosis. Left carotid system: Patent. Calcified plaque along the common carotid causing less than 50% stenosis. Mixed plaque along the proximal internal carotid causing 60% stenosis. Additional calcified plaque along the distal cervical ICA with less than 50% stenosis. Vertebral arteries: Patent. Left vertebral artery is dominant. No stenosis. Skeleton: Degenerative changes of the cervical spine, greatest at C4-C5 and C5-C6. Other neck: Unremarkable. Upper chest: Emphysema. Review of the MIP images confirms the above findings CTA HEAD Anterior  circulation: Intracranial internal carotid arteries are patent with diffuse mixed but primarily calcified plaque with moderate to marked stenosis. Anterior and middle cerebral arteries are patent. There is fenestration of the proximal left M1 MCA. Posterior circulation: Intracranial vertebral arteries are patent. There is calcified plaque on the left greater than right. Moderate to marked stenosis on the left. Right PICA origin is patent with occlusion approximately 11 mm beyond the origin. Left PICA origin is patent. Superior cerebellar artery origins are patent. Right posterior communicating artery is present. Possible small left posterior communicating artery. Posterior cerebral arteries are patent with atherosclerotic irregularity. There is mild to moderate stenosis on the left. Venous sinuses: Patent as allowed  by contrast bolus timing. Review of the MIP images confirms the above findings IMPRESSION: Occlusion of the right PICA approximately 11 mm beyond the origin. Patent vertebral arteries. No stenosis in the neck. Calcified plaque intracranially on the left greater than right. Plaque along the common carotids causes less than 50% stenosis. Plaque at the right ICA origin causes 70% stenosis. Plaque at the left ICA origin causes 60% stenosis. Plaque along the intracranial internal carotids causes moderate to marked stenosis. Emphysema. Electronically Signed   By: Macy Mis M.D.   On: 07/21/2020 15:04   CT Head Wo Contrast  Addendum Date: 07/21/2020   ADDENDUM REPORT: 07/21/2020 13:48 ADDENDUM: Suspected posterior circulation infarct may extend slightly anterior to the fourth ventricle as well. Electronically Signed   By: Lowella Grip III M.D.   On: 07/21/2020 13:48   Result Date: 07/21/2020 CLINICAL DATA:  Episodes of severe vomiting EXAM: CT HEAD WITHOUT CONTRAST TECHNIQUE: Contiguous axial images were obtained from the base of the skull through the vertex without intravenous contrast.  COMPARISON:  None. FINDINGS: Brain: There is mild diffuse atrophy. There is apparent vasogenic edema in the medial aspect of the posteroinferior right cerebellum extending to the level of the posterior fourth ventricle on the right. Elsewhere there is decreased attenuation in the anterior rostrum of corpus callosum on the right consistent prior infarct in this area. There are white matter infarcts in the left superior centrum semiovale as well as a prior infarct in the posterosuperior left parietal lobe. Periventricular small vessel disease in the centra semiovale bilaterally also noted. No well-defined mass evident. No hemorrhage, extra-axial fluid collection, or midline shift. Vascular: No hyperdense vessel evident. There is calcification in each distal vertebral artery and carotid siphon region. Skull: The bony calvarium appears intact. Sinuses/Orbits: There is mucosal thickening in several ethmoid air cells. Other visualized paranasal sinuses are clear. Orbits appear symmetric bilaterally. Other: Mastoid air cells are clear. IMPRESSION: 1. Suspected acute infarct involving a portion of the right posteroinferior cerebellar artery distribution. There is edema with sulcal effacement in this area extending medially to the inferior aspect of the fourth ventricle on the right. An underlying mass in this area is a less likely differential consideration. Correlation with MR pre and post-contrast to further evaluate may well be warranted. 2. Prior infarct in the superior posterior left parietal lobe. Prior infarct in rostrum of corpus callosum on the right. White matter infarcts noted in the superior left centrum semiovale which appear chronic. There is periventricular small vessel disease. 3.  No hemorrhage or extra-axial fluid. 4.  Multiple foci of arterial vascular calcification noted. 5.  Mucosal thickening noted in several ethmoid air cells. Electronically Signed: By: Lowella Grip III M.D. On: 07/21/2020 12:23    CT Angio Neck W and/or Wo Contrast  Result Date: 07/21/2020 CLINICAL DATA:  Vomiting with acute infarct on MRI EXAM: CT ANGIOGRAPHY HEAD AND NECK TECHNIQUE: Multidetector CT imaging of the head and neck was performed using the standard protocol during bolus administration of intravenous contrast. Multiplanar CT image reconstructions and MIPs were obtained to evaluate the vascular anatomy. Carotid stenosis measurements (when applicable) are obtained utilizing NASCET criteria, using the distal internal carotid diameter as the denominator. CONTRAST:  2mL OMNIPAQUE IOHEXOL 350 MG/ML SOLN COMPARISON:  None. FINDINGS: CTA NECK Aortic arch: Calcified plaque along the arch and patent great vessel origins. No high-grade proximal subclavian artery stenosis. Right carotid system: Patent. Calcified plaque along the common carotid with less than 50% stenosis. Mixed plaque the  proximal internal carotid causing 70% stenosis. Left carotid system: Patent. Calcified plaque along the common carotid causing less than 50% stenosis. Mixed plaque along the proximal internal carotid causing 60% stenosis. Additional calcified plaque along the distal cervical ICA with less than 50% stenosis. Vertebral arteries: Patent. Left vertebral artery is dominant. No stenosis. Skeleton: Degenerative changes of the cervical spine, greatest at C4-C5 and C5-C6. Other neck: Unremarkable. Upper chest: Emphysema. Review of the MIP images confirms the above findings CTA HEAD Anterior circulation: Intracranial internal carotid arteries are patent with diffuse mixed but primarily calcified plaque with moderate to marked stenosis. Anterior and middle cerebral arteries are patent. There is fenestration of the proximal left M1 MCA. Posterior circulation: Intracranial vertebral arteries are patent. There is calcified plaque on the left greater than right. Moderate to marked stenosis on the left. Right PICA origin is patent with occlusion approximately 11 mm  beyond the origin. Left PICA origin is patent. Superior cerebellar artery origins are patent. Right posterior communicating artery is present. Possible small left posterior communicating artery. Posterior cerebral arteries are patent with atherosclerotic irregularity. There is mild to moderate stenosis on the left. Venous sinuses: Patent as allowed by contrast bolus timing. Review of the MIP images confirms the above findings IMPRESSION: Occlusion of the right PICA approximately 11 mm beyond the origin. Patent vertebral arteries. No stenosis in the neck. Calcified plaque intracranially on the left greater than right. Plaque along the common carotids causes less than 50% stenosis. Plaque at the right ICA origin causes 70% stenosis. Plaque at the left ICA origin causes 60% stenosis. Plaque along the intracranial internal carotids causes moderate to marked stenosis. Emphysema. Electronically Signed   By: Macy Mis M.D.   On: 07/21/2020 15:04   MR Brain W and Wo Contrast  Result Date: 07/21/2020 CLINICAL DATA:  Vomiting, abnormal CT head EXAM: MRI HEAD WITHOUT AND WITH CONTRAST TECHNIQUE: Multiplanar, multiecho pulse sequences of the brain and surrounding structures were obtained without and with intravenous contrast. CONTRAST:  46mL GADAVIST GADOBUTROL 1 MMOL/ML IV SOLN COMPARISON:  Correlation made with same day CT head FINDINGS: Brain: There is reduced diffusion in the right cerebellum involving the PICA territory. A punctate focus of reduced diffusion is also present in the right occipital lobe. Small subacute to chronic right frontal cortical infarct. Chronic left parietal larger than frontal infarcts. Small chronic infarct of the right ventral corpus callosum. Additional patchy T2 hyperintensity in the supratentorial white matter is nonspecific but may reflect mild chronic microvascular ischemic changes. There is a 4 mm dural-based enhancing lesion along the left temporal convexity likely reflecting a tiny  meningioma (series 21, image 15). A focus of susceptibility hypointensity is present in the region of the left perimesencephalic cistern, midbrain, and thalamus. Unclear if parenchymal or extra-axial. May reflect chronic blood products or mineralization. No significant mass effect. There is no hydrocephalus or extra-axial fluid collection. No abnormal parenchymal enhancement. Vascular: Major vessel flow voids at the skull base are preserved. Skull and upper cervical spine: Normal marrow signal is preserved. Sinuses/Orbits: Paranasal sinuses are aerated. Orbits are unremarkable. Other: Sella is unremarkable. Patchy right mastoid fluid opacification. IMPRESSION: Large acute infarct of the right PICA territory. Punctate acute right occipital infarct. Small subacute to chronic right frontal cortical infarct. Chronic infarcts of left frontoparietal lobes and right ventral corpus callosum. Mild chronic microvascular ischemic changes. Tiny left temporal convexity meningioma. Electronically Signed   By: Macy Mis M.D.   On: 07/21/2020 14:49   US Carotid Bilateral (  at [facility] and [facility] only)  Result Date: 07/21/2020 CLINICAL DATA:  Stroke symptoms, large acute right PICA infarct by MR EXAM: BILATERAL CAROTID DUPLEX ULTRASOUND TECHNIQUE: Pearline Cables scale imaging, color Doppler and duplex ultrasound were performed of bilateral carotid and vertebral arteries in the neck. COMPARISON:  07/21/2020 FINDINGS: Criteria: Quantification of carotid stenosis is based on velocity parameters that correlate the residual internal carotid diameter with NASCET-based stenosis levels, using the diameter of the distal internal carotid lumen as the denominator for stenosis measurement. The following velocity measurements were obtained: RIGHT ICA: 435/99 cm/sec CCA: 34/19 cm/sec SYSTOLIC ICA/CCA RATIO:  6.0 ECA: 247 cm/sec LEFT ICA: 158/38 cm/sec CCA: 62/22 cm/sec SYSTOLIC ICA/CCA RATIO:  1.7 ECA: 116 cm/sec RIGHT CAROTID ARTERY: Marked heterogeneous  partially calcified bifurcation atherosclerosis. Significant bifurcation and proximal ICA luminal narrowing by grayscale imaging. Proximal ICA velocity elevation measures up to 435/99 centimeters/second with diffuse turbulent flow and spectral broadening. Right ICA stenosis estimated at greater than 70% by ultrasound criteria. RIGHT VERTEBRAL ARTERY: Antegrade flow with some reversed diastolic flow LEFT CAROTID ARTERY: Moderate heterogeneous and calcified bifurcation atherosclerosis. Mild velocity elevation as above. Only minimal turbulent flow with spectral broadening. Left ICA stenosis estimated at 50-69% by ultrasound criteria. LEFT VERTEBRAL ARTERY:  Normal antegrade flow IMPRESSION: Bilateral carotid atherosclerosis, worse on the right. Right ICA stenosis estimated greater than 70% Left ICA stenosis estimated at 50-69% Abnormal right vertebral artery waveform with some reversed diastolic flow. Normal antegrade left vertebral artery flow Electronically Signed   By: Jerilynn Mages.  Shick M.D.   On: 07/21/2020 16:53    EKG: Independently reviewed.   Assessment/Plan Principal Problem:   Acute CVA (cerebrovascular accident) ([place]) Observation/telemetry. Frequent neurochecks. Swallow screen. Refer to [facility] if abnormal. Consult physical and Occupational Therapy. Check hemoglobin A1c and fasting lipids. Check echocardiogram and carotid Doppler. Continue aspirin and statin. Neuro hospitalist team will be following.  Active Problems:   Hypertension Allow permissive hypertension. Hold lisinopril, metoprolol and amlodipine. Monitor blood pressure closely.    Hyperlipidemia Continue simvastatin 20 mg p.o. daily.    TOBACCO ABUSE Tobacco cessation advised. Nicotine replacement therapy ordered.    CAD (coronary artery disease) Continue aspirin and statin. Holding beta-blocker. Follow-up with cardiology as scheduled.    AAA No recent screening. Per EMR, last abdominal imaging in December/2017.    Bladder  cancer [facility])     Follow-up with oncology as scheduled.    Type 2 diabetes mellitus (HCC) Carbohydrate modified diet. Follow hemoglobin A1c. CBG monitoring with [facility] SS. On gabapentin for neuropathy/pain.     DVT prophylaxis: On Lovenox SQ. Code Status:   Full code. Family Communication:   Disposition Plan:   Patient is from:  Home.  Anticipated DC to:  Home.  Anticipated DC date:  07/23/2020.  Anticipated DC barriers: Clinical status, pending work-up and consultant sign off.  Consults called:  Teleneurology/neuro hospitalist team. Admission status:  Inpatient/telemetry.   Severity of Illness: High severity in the setting of acute CVA of multiple areas of brain.  Patient will be admitted for further work-up and close monitoring.   Reubin Milan MD [facility]  How to contact the [facility] Attending or Consulting provider [place] or covering provider during after hours [place], for this patient?   Check the care team in [facility] and look for a) attending/consulting [facility] provider listed and b) the [facility] team listed Log into [email or web address] and use [place]'s universal password to access. If you do not have the password, please contact the hospital  operator. Locate the [facility] provider you are looking for under [facility] and page to a number that you can be directly reached. If you still have difficulty reaching the provider, please page the [facility] (Director on Call) for the Hospitalists listed on amion for assistance.  07/21/2020, 5:00 PM   This document was prepared using Dragon voice recognition software and may contain some unintended transcription errors.

## 2020-07-21 NOTE — ED Triage Notes (Addendum)
Pt presents to ED with complaints of vomiting since yesterday am. States he can't keep anything down, denies abdominal pain. States he has had 3-4 episodes in last 24 hours. Pt states he has also had a headache.

## 2020-07-21 NOTE — Progress Notes (Signed)
   07/21/20 2143  Vitals  Temp 98.8 F (37.1 C)  Temp Source Oral  BP (!) 193/90  MAP (mmHg) 119  BP Location Left Arm  BP Method Automatic  Patient Position (if appropriate) Lying  Pulse Rate 85  Pulse Rate Source Monitor  Resp 18  MEWS COLOR  MEWS Score Color Green  Oxygen Therapy  SpO2 97 %  O2 Device Room Air  MEWS Score  MEWS Temp 0  MEWS Systolic 0  MEWS Pulse 0  MEWS RR 0  MEWS LOC 0  MEWS Score 0   He patient is transferred from [facility] to [facility], and he was accompanied by 2 Paramedics.  A & O x 4. Denies any acute pain at this time. Full assessment to epic completed. Patient voice no concern. Will continue to monitor.

## 2020-07-22 ENCOUNTER — Inpatient Hospital Stay (HOSPITAL_COMMUNITY): Payer: Medicare HMO

## 2020-07-22 ENCOUNTER — Encounter (HOSPITAL_COMMUNITY): Payer: Self-pay | Admitting: Internal Medicine

## 2020-07-22 DIAGNOSIS — I6523 Occlusion and stenosis of bilateral carotid arteries: Secondary | ICD-10-CM

## 2020-07-22 DIAGNOSIS — I10 Essential (primary) hypertension: Secondary | ICD-10-CM

## 2020-07-22 DIAGNOSIS — C679 Malignant neoplasm of bladder, unspecified: Secondary | ICD-10-CM

## 2020-07-22 DIAGNOSIS — E785 Hyperlipidemia, unspecified: Secondary | ICD-10-CM

## 2020-07-22 DIAGNOSIS — F17200 Nicotine dependence, unspecified, uncomplicated: Secondary | ICD-10-CM

## 2020-07-22 DIAGNOSIS — I639 Cerebral infarction, unspecified: Secondary | ICD-10-CM | POA: Diagnosis not present

## 2020-07-22 DIAGNOSIS — I6389 Other cerebral infarction: Secondary | ICD-10-CM | POA: Diagnosis not present

## 2020-07-22 DIAGNOSIS — I25119 Atherosclerotic heart disease of native coronary artery with unspecified angina pectoris: Secondary | ICD-10-CM

## 2020-07-22 LAB — ECHOCARDIOGRAM COMPLETE BUBBLE STUDY
AR max vel: 2.81 cm2
AV Area VTI: 2.71 cm2
AV Area mean vel: 2.62 cm2
AV Mean grad: 3.5 mmHg
AV Peak grad: 7.2 mmHg
Ao pk vel: 1.34 m/s
Area-P 1/2: 2.8 cm2
S' Lateral: 2.8 cm

## 2020-07-22 LAB — RAPID URINE DRUG SCREEN, HOSP PERFORMED
Amphetamines: NOT DETECTED
Barbiturates: NOT DETECTED
Benzodiazepines: NOT DETECTED
Cocaine: NOT DETECTED
Opiates: NOT DETECTED
Tetrahydrocannabinol: NOT DETECTED

## 2020-07-22 LAB — GLUCOSE, CAPILLARY
Glucose-Capillary: 117 mg/dL — ABNORMAL HIGH (ref 70–99)
Glucose-Capillary: 216 mg/dL — ABNORMAL HIGH (ref 70–99)
Glucose-Capillary: 175 mg/dL — ABNORMAL HIGH (ref 70–99)
Glucose-Capillary: 189 mg/dL — ABNORMAL HIGH (ref 70–99)

## 2020-07-22 LAB — LIPID PANEL
Cholesterol: 165 mg/dL (ref 0–200)
HDL: 55 mg/dL (ref >40)
LDL Cholesterol: 82 mg/dL (ref 0–99)
Total CHOL/HDL Ratio: 3 RATIO
Triglycerides: 142 mg/dL (ref <150)
VLDL: 28 mg/dL (ref 0–40)

## 2020-07-22 MED ORDER — ADULT MULTIVITAMIN W/MINERALS CH
1.0000 | ORAL_TABLET | Freq: Every day | ORAL | Status: DC
  Administered 2020-07-23 – 2020-07-25 (×3): 1 via ORAL
  Filled 2020-07-22 (×3): qty 1

## 2020-07-22 MED ORDER — ENSURE ENLIVE PO LIQD
237.0000 mL | Freq: Two times a day (BID) | ORAL | Status: DC
  Administered 2020-07-22 – 2020-07-24 (×5): 237 mL via ORAL
  Filled 2020-07-22: qty 237

## 2020-07-22 MED ORDER — METOPROLOL TARTRATE 50 MG PO TABS
50.0000 mg | ORAL_TABLET | Freq: Two times a day (BID) | ORAL | Status: DC
  Administered 2020-07-22 – 2020-07-23 (×2): 50 mg via ORAL
  Filled 2020-07-22 (×5): qty 1

## 2020-07-22 MED ORDER — CLOPIDOGREL BISULFATE 75 MG PO TABS
75.0000 mg | ORAL_TABLET | Freq: Every day | ORAL | Status: DC
  Administered 2020-07-22 – 2020-07-25 (×4): 75 mg via ORAL
  Filled 2020-07-22 (×4): qty 1

## 2020-07-22 MED ORDER — HYDRALAZINE HCL 10 MG PO TABS: 10.0000 mg | ORAL_TABLET | Freq: Four times a day (QID) | ORAL | Status: DC | PRN

## 2020-07-22 NOTE — Consult Note (Signed)
Neurology Stroke Consult H&P  Kenneth Kelly MR# [id number] 07/22/2020  CC: acute stroke  History is obtained from: patient and chart  HPI: Kenneth Kelly is a 70 y.o. male PMHx as reviewed below presented to OSH with complaints of unsteadiness, intractable nausea and vomiting which began 07/20/2020. 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 He was transferred to [facility] for higher level of care.  The following information taken for History and Physical 07/21/2020: The patient presented "...to the emergency department with complaints of nausea associated with multiple episodes of emesis, generalized weakness, unsteady gait yesterday morning which was followed by frontal headache later in the evening.  He denies fever, chills, sore throat, rhinorrhea, wheezing or hemoptysis.  No chest pain, palpitations, diaphoresis, PND, orthopnea or pitting edema of the lower extremities.  No abdominal pain, diarrhea, constipation, melena or hematochezia.  Denies dysuria, frequency or hematuria.  He has nocturia once a night.  Denies polyuria, polydipsia, polyphagia or blurred vision."   with significant vascular risk factors HLD, HTN, DM2, CAD s/p CABGx3 presented to OSH with acute right PICA occlusive stroke.    LKW: unclear onset. tpa given: No OSW IR Thrombectomy No Modified Rankin Scale: 0-Completely asymptomatic and back to baseline post- stroke NIHSS: 2 ataxia in 2 limbs.  ROS: A complete ROS was performed and is negative except as noted in the HPI.   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 He has a 28.00 pack-year smoking history. He has never used smokeless tobacco. He reports current alcohol use. He reports that he does not use drugs.   Prior to Admission medications   Medication Sig Start Date End Date Taking? Authorizing Provider  acetaminophen (TYLENOL) 500 MG tablet Take 1,000-1,500 mg by mouth every 6 (six) hours as needed (Pain).   Yes [provider]  amLODipine (NORVASC) 10 MG tablet Take 10 mg by mouth every morning.    Yes [provider]  gabapentin (NEURONTIN) 300 MG capsule Take 900 mg by mouth 2 (two) times daily.   Yes [provider]  ibuprofen (ADVIL,MOTRIN) 200 MG tablet Take 400-600 mg by mouth every 6 (six) hours as needed (pain).   Yes [provider]  lisinopril (PRINIVIL,ZESTRIL) 10 MG tablet Take 10 mg by mouth every morning.   Yes [provider]  metFORMIN (GLUCOPHAGE) 1000 MG tablet Take  500 mg by mouth 2 (two) times daily with a meal.   Yes [provider]  metoprolol (LOPRESSOR) 50 MG tablet Take 50 mg by mouth 2 (two) times daily.     Yes [provider]  simvastatin (ZOCOR) 20 MG tablet Take 20 mg by mouth every morning.   Yes [provider]  aspirin EC 81 MG tablet Take 1 tablet (81 mg total) by mouth daily. Patient not taking: No sig reported 02/27/16   Herminio Commons, MD  nitroGLYCERIN (NITROSTAT) 0.4 MG SL tablet Place 1 tablet (0.4 mg total) under the tongue every 5 (five)  minutes as needed for chest pain. 02/27/16   Herminio Commons, MD    Exam: Current vital signs: BP (!) 164/82 (BP Location: Left Arm)   Pulse 61   Temp 98.3 F (36.8 C) (Oral)   Resp 18   Ht 5\' 3"  (1.6 m)   Wt 58.1 kg   SpO2 96%   BMI 22.67 kg/m   Physical Exam  Constitutional: Appears well-developed and well-nourished.  Psych: Affect appropriate to situation Eyes: No scleral injection HENT: No OP obstruction. Head: Normocephalic.  Cardiovascular: Normal rate and regular rhythm.  Respiratory: Effort normal, symmetric excursions bilaterally, no audible wheezing. GI: Soft.  No distension. There is no tenderness.  Skin: WDI  Neuro: Mental Status: Patient is awake, alert, oriented to person, place, month, year, and situation. Patient is able to give a clear and coherent history. Speech fluent, intact comprehension and repetition. No signs of aphasia or neglect. Visual Fields are full. Pupils are equal, round, and reactive to light. EOMI without ptosis or diploplia.  Facial sensation is symmetric to temperature Facial movement is symmetric.  Hearing is intact to voice. Uvula midline and palate elevates symmetrically. Shoulder shrug is symmetric. Tongue is midline without atrophy or fasciculations.  Tone is normal. Bulk is normal. 5/5 strength was present in all four extremities. Sensation is symmetric to light touch and temperature in the arms and legs. Deep Tendon Reflexes: 2+ and symmetric in the biceps and patellae. Toes are downgoing bilaterally. FNF and HKS mild ataxia right extremities. Gait - Deferred  I have reviewed labs in epic and the pertinent results are:   Ref. Range 07/21/2020 11:35  Glucose Latest Ref Range: 70 - 99 mg/dL 293 (H)    I have reviewed the images obtained:  CTA head and neck showed occlusion of the right PICA ~56mm distal to origin. Plaque along b/l common carotid arteries <50% stenosis.  Plaque at origin of right ICA ~70% stenosis.   Plaque at origin of left ICA ~60% stenosis.  Plaque along the intracranial internal carotids causes moderate to marked stenosis.  MRI brain showed acute large territory infarction in the right PICA distrubution. Acute punctate infarction in right occipital lobe. Chronic infarcts in right frontal cortex, left frontoparietal lobes, right ventral corpus callosum (Se17, 20/32).  Carotid ultrasound read as bilateral carotid atherosclerosis, worse on the right. Right ICA stenosis estimated greater than 70%. Left ICA stenosis estimated at 50-69% Abnormal right vertebral artery waveform with reversed diastolic flow.   Assessment: Kenneth Kelly is a 70 y.o. male right handed PMHx as above with significant vascular risk factors HLD, HTN, DM2, CAD s/p CABGx3 presented to OSH with acute right PICA embolic stroke.  Review of MRI did show areas of chronic stroke in bilateral MCA territories left > right in setting of bilateral internal carotid artery stenosis >50%.  Carotid ultrasound read as abnormal right vertebral artery  waveform with reversed diastolic flow which is most commonly due to subclavian stenosis however, not not exclusively and in very rare cases can be seen with advanced bilateral carotid artery stenosis. Angiography did not show subclavian stenosis and it is not clear if this is techneque artifact and perhaps measurement of hemodynamic parameters (maximum peak velocity, etc...) in the vertebral arteries can help uncover cause of bidirectional flow. Furthermore, it is possible the stroke may be due to the reversal of flow and he may benefit from vascular evaluation.   He was on aspirin 81mg  for secondary cardiac prevention and will need to optimize secondary stroke prevention with aggressive medical management.    Impression: Acute right PICA occlusion with stroke. 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 CAD HTN HLD DM2   Plan: - MRI brain without contrast. - Recommend  vascular imaging with MRA head and neck. - Recommend TTE. - Recommend labs: HbA1c, lipid panel, TSH. - Maximize Statin if LDL > 70 - In the setting of intracranial steno-occlusive disease and stroke he may benefit from aspirin 325mg  daily and clopidogrel 75mg  daily for 90 days. - Target BP <149/90.  - Telemetry monitoring for arrhythmia. - Recommend bedside Swallow screen. - Recommend Stroke education. - Recommend PT/OT/SLP consult.   Electronically signed by:  Lynnae Sandhoff, MD Page: [phone] 07/22/2020, 3:55 AM

## 2020-07-22 NOTE — Progress Notes (Signed)
Initial Nutrition Assessment  DOCUMENTATION CODES:   Not applicable  INTERVENTION:   Ensure Enlive po BID, each supplement provides 350 kcal and 20 grams of protein  MVI po daily   NUTRITION DIAGNOSIS:   Inadequate oral intake related to acute illness (CVA) as evidenced by per patient/family report.  GOAL:   Patient will meet greater than or equal to 90% of their needs  MONITOR:   PO intake, Supplement acceptance, Labs, Weight trends, Skin, I & O's  REASON FOR ASSESSMENT:   Malnutrition Screening Tool    ASSESSMENT:   70 y.o. male with medical history significant of AAA, osteoarthritis of the hip, bladder tumor s/p TURBT, CAD,/CABG x3, first-degree AV block, sinus bradycardia, gout with history of gouty arthritis of multiple sites, fractured pelvis, hyperlipidemia, hypertension and type II DM who is admitted with CVA  RD working remotely.  Unable to reach pt by phone. Per chart review, pt with poor oral intake for at least one day pta r/t nausea and vomiting. SLP evaluation pending. RD will add supplements and MVI to help pt meet his estimated needs. Per chart, pt appears weight stable pta.   Medications reviewed and include: aspirin, plavix, lovenox, insulin, metformin  Labs reviewed: BUN 28(H)  NUTRITION - FOCUSED PHYSICAL EXAM: Unable to perform at this time   Diet Order:   Diet Order             Diet heart healthy/carb modified Room service appropriate? Yes; Fluid consistency: Thin  Diet effective now                  EDUCATION NEEDS:   No education needs have been identified at this time  Skin:  Skin Assessment: Reviewed RN Assessment  Last BM:  6/17  Height:   Ht Readings from Last 1 Encounters:  07/21/20 5\' 3"  (1.6 m)    Weight:   Wt Readings from Last 1 Encounters:  07/21/20 58.1 kg    Ideal Body Weight:  56.3 kg  BMI:  Body mass index is 22.67 kg/m.  Estimated Nutritional Needs:   Kcal:  1600-1800kcal/day  Protein:   80-90g/day  Fluid:  1.6-1.8L/day  Koleen Distance MS, RD, LDN Please refer to [facility] for RD and/or RD on-call/weekend/after hours pager

## 2020-07-22 NOTE — Evaluation (Signed)
Occupational Therapy Evaluation Patient Details Name: Kenneth Kelly MRN: [id number] DOB: 06/15/1950 Today's Date: 07/22/2020    History of Present Illness 70 y.o. 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  Also seen punctuated acute right occipital infarct with subacute right frontal cortical infarct per chart.   Clinical Impression   Patient admitted for the diagnosis above.  PTA he stays with his brother and SIL in a mobile home.  Patient is retired, drove on occasion, and required no assist for any self care or mobility.  Biggest barrier is poor dynamic stand balance.  Currently he has the ability to complete ADL, and in room mobility, but he needs balance support.  HHA of one was used in the room, and for a short walk in the halls.  Patient is open to RW usage and [employer] services if needed.  Recommend initial 24 hour support at home, but OT will follow in the acute setting to maximize functional status.      Follow Up Recommendations  No OT follow up    Equipment Recommendations  None recommended by OT    Recommendations for Other Services       Precautions / Restrictions Precautions Precautions: Fall Restrictions Weight Bearing Restrictions: No      Mobility Bed Mobility Overal bed mobility: Modified Independent               Patient Response: Flat affect  Transfers   Equipment used: 1 person hand held assist             General transfer comment: balance support    Balance Overall balance assessment: Needs assistance Sitting-balance support: Feet supported;No upper extremity supported Sitting balance-Leahy Scale: Good      Standing balance support: Single extremity supported Standing balance-Leahy Scale: Poor Standing balance comment: needs external support                           ADL either performed or assessed with clinical judgement   ADL Overall ADL's : Needs assistance/impaired     Grooming: Wash/dry hands;Oral care;Min guard;Standing           Upper Body Dressing : Set up;Sitting   Lower Body Dressing: Set up;Sitting/lateral leans   Toilet Transfer: Minimal assistance;Ambulation Toilet Transfer Details (indicate cue type and reason): balance support Toileting- Clothing Manipulation and Hygiene: Supervision/safety;Sitting/lateral lean       Functional mobility during ADLs: Minimal assistance General ADL Comments: balance support     Vision Patient Visual Report: No change from baseline Vision Assessment?: No apparent visual deficits     Perception     Praxis      Pertinent Vitals/Pain Pain Assessment: Faces Faces Pain Scale: Hurts a little bit Pain Location: headache Pain Descriptors / Indicators: Aching Pain Intervention(s): Monitored during session     Hand Dominance Right   Extremity/Trunk Assessment Upper Extremity Assessment Upper Extremity Assessment: Overall WFL for tasks assessed   Lower Extremity Assessment Lower Extremity Assessment: Defer to PT evaluation   Cervical / Trunk Assessment Cervical / Trunk Assessment: Normal   Communication Communication Communication: No difficulties   Cognition Arousal/Alertness: Awake/alert Behavior During Therapy: WFL for tasks  assessed/performed Overall Cognitive Status: Within Functional Limits for tasks assessed                                                      Home Living Family/patient expects to be discharged to:: Private residence Living Arrangements: Other relatives Available Help at Discharge: Family Type of Home: Mobile home Home Access: Stairs to enter Entrance  Stairs-Number of Steps: 6 steps at the bac, and 2 in the front.   Home Layout: One level     Bathroom Shower/Tub: Teacher, early years/pre: Standard     Home Equipment: Cane - single point;Crutches;Walker - 2 wheels          Prior Functioning/Environment Level of Independence: Independent                 OT Problem List: Impaired balance (sitting and/or standing)      OT Treatment/Interventions: Self-care/ADL training;Therapeutic activities;Balance training;DME and/or AE instruction    OT Goals(Current goals can be found in the care plan section) Acute Rehab OT Goals Patient Stated Goal: hoping to get back home OT Goal Formulation: With patient Time For Goal Achievement: 08/05/20 Potential to Achieve Goals: Good ADL Goals Pt Will Perform Lower Body Bathing: Independently;sit to/from stand Pt Will Perform Lower Body Dressing: Independently;sit to/from stand Pt Will Perform Toileting - Clothing Manipulation and hygiene: Independently;sit to/from stand Pt Will Perform Tub/Shower Transfer: Independently;ambulating  OT Frequency:     Barriers to D/C:    none noted       Co-evaluation              AM-PAC OT "6 Clicks" Daily Activity     Outcome Measure Help from another person eating meals?: None Help from another person taking care of personal grooming?: A Little Help from another person toileting, which includes using toliet, bedpan, or urinal?: A Little Help from another person bathing (including washing, rinsing, drying)?: A Little Help from another person to put on and taking off regular upper body clothing?: None Help from another person to put on and taking off regular lower body clothing?: A Little 6 Click Score: 20   End of Session Nurse Communication: Mobility status  Activity Tolerance: Patient tolerated treatment well Patient left: in bed;with call bell/phone within reach;with bed alarm set  OT Visit Diagnosis: Unsteadiness on feet  (R26.81)                Time: 0315-9458 OT Time Calculation (min): 27 min Charges:  OT General Charges $OT Visit: 1 Visit OT Evaluation $OT Eval Moderate Complexity: 1 Mod OT Treatments $Self Care/Home Management : 8-22 mins  07/22/2020  Rich, OTR/L  Acute Rehabilitation Services  Office:  [phone]   Metta Clines 07/22/2020, 10:20 AM

## 2020-07-22 NOTE — Plan of Care (Signed)
  Problem: Education: Goal: Knowledge of secondary prevention will improve Outcome: Progressing Goal: Knowledge of patient specific risk factors addressed and post discharge goals established will improve Outcome: Progressing   Problem: Health Behavior/Discharge Planning: Goal: Ability to manage health-related needs will improve Outcome: Progressing

## 2020-07-22 NOTE — Evaluation (Signed)
Physical Therapy Evaluation Patient Details Name: Kenneth Kelly MRN: [id number] DOB: 1950/05/17 Today's Date: 07/22/2020   History of Present Illness  71 y.o. 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  Also seen punctuated acute right occipital infarct with subacute right frontal cortical infarct per chart.  Clinical Impression  PTA, pt lives with his sibling in a mobile home and is independent. Pt presents with decreased functional mobility secondary to decreased cognition, poor static and dynamic standing balance, and decreased lower extremity coordination. Pt A&Ox3; reports month is July. Pt requiring consistent min assist to navigate hallway distances, often staggering left, requiring use of gait belt to return to upright. Pt with no awareness of these issues, reporting he had no residual deficits. Slight improvement with use of walker, however, question pt recalling or carrying over use in the home. Pt presents as a significant high fall risk based on decreased gait speed, balance, and safety awareness. Currently recommending CIR to address deficits, maximize functional independence, and decrease caregiver burden. Suspect excellent progress given age, motivation, PLOF.     Follow Up Recommendations CIR;Supervision/Assistance - 24 hour    Equipment Recommendations  Rolling walker with 5" wheels (tub bench)    Recommendations for Other Services       Precautions / Restrictions Precautions Precautions: Fall Restrictions Weight Bearing Restrictions: No      Mobility  Bed Mobility Overal bed mobility: Modified Independent             General bed  mobility comments: Sitting EOB upon arrival    Transfers Overall transfer level: Needs assistance Equipment used: None Transfers: Sit to/from Stand Sit to Stand: Min guard         General transfer comment: Min guard to rise from edge of bed  Ambulation/Gait Ambulation/Gait assistance: Min assist Gait Distance (Feet): 150 Feet Assistive device: None Gait Pattern/deviations: Step-through pattern;Staggering left;Decreased stride length Gait velocity: decreased   General Gait Details: Pt with several episodes of staggering to the left, requiring minA to correct. Pt not stopping to self correct or seemingly having any awareness of gross loss of balance. He often tries to reach for external support, decreased fluidity of gait, tendency for downward gaze.  Stairs Stairs: Yes Stairs assistance: Min assist Stair Management: One rail Left Number of Stairs: 2 General stair comments: Cues for step by step, minA for balance support  Wheelchair Mobility    Modified Rankin (Stroke Patients Only) Modified Rankin (Stroke Patients Only) Pre-Morbid Rankin Score: No symptoms Modified Rankin: Moderately severe disability     Balance Overall balance assessment: Needs assistance Sitting-balance support: Feet supported;No upper extremity supported Sitting balance-Leahy Scale: Good     Standing balance support: No upper extremity supported;During functional activity Standing balance-Leahy Scale: Poor Standing balance comment: needs external support                             Pertinent Vitals/Pain Pain Assessment: No/denies pain Faces Pain Scale: Hurts a little bit Pain Location: headache Pain Descriptors / Indicators: Aching Pain Intervention(s): Monitored during session    Home Living Family/patient expects to be discharged to:: Private residence Living Arrangements: Other relatives Available  Help at Discharge: Family Type of Home: Mobile home Home Access: Stairs to  enter   Entrance Stairs-Number of Steps: 6 steps at the back, and 2 in the front. Home Layout: One level Home Equipment: Cane - single point;Crutches;Walker - 2 wheels      Prior Function Level of Independence: Independent         Comments: Retired     Journalist, newspaper   Dominant Hand: Right    Extremity/Trunk Assessment   Upper Extremity Assessment Upper Extremity Assessment: Defer to OT evaluation    Lower Extremity Assessment Lower Extremity Assessment: RLE deficits/detail;LLE deficits/detail RLE Deficits / Details: Strength 5/5 LLE Deficits / Details: Strength 5/5    Cervical / Trunk Assessment Cervical / Trunk Assessment: Normal  Communication   Communication: No difficulties  Cognition Arousal/Alertness: Awake/alert Behavior During Therapy: WFL for tasks assessed/performed Overall Cognitive Status: Impaired/Different from baseline Area of Impairment: Orientation;Safety/judgement;Awareness                 Orientation Level: Disoriented to;Time       Safety/Judgement: Decreased awareness of safety;Decreased awareness of deficits Awareness: Emergent   General Comments: Very poor awareness or recognition of deficits      General Comments      Exercises     Assessment/Plan    PT Assessment Patient needs continued PT services  PT Problem List Decreased activity tolerance;Decreased balance;Decreased mobility       PT Treatment Interventions DME instruction;Gait training;Stair training;Functional mobility training;Therapeutic activities;Therapeutic exercise;Balance training;Patient/family education    PT Goals (Current goals can be found in the Care Plan section)  Acute Rehab PT Goals Patient Stated Goal: hoping to get back home PT Goal Formulation: With patient Time For Goal Achievement: 08/05/20 Potential to Achieve Goals: Good    Frequency Min 4X/week   Barriers to discharge        Co-evaluation               AM-PAC PT "6  Clicks" Mobility  Outcome Measure Help needed turning from your back to your side while in a flat bed without using bedrails?: None Help needed moving from lying on your back to sitting on the side of a flat bed without using bedrails?: None Help needed moving to and from a bed to a chair (including a wheelchair)?: A Little Help needed standing up from a chair using your arms (e.g., wheelchair or bedside chair)?: A Little Help needed to walk in hospital room?: A Little Help needed climbing 3-5 steps with a railing? : A Little 6 Click Score: 20    End of Session Equipment Utilized During Treatment: Gait belt Activity Tolerance: Patient tolerated treatment well Patient left: in bed;with call bell/phone within reach;with bed alarm set Nurse Communication: Mobility status PT Visit Diagnosis: Unsteadiness on feet (R26.81)    Time: 2130-8657 PT Time Calculation (min) (ACUTE ONLY): 18 min   Charges:   PT Evaluation $PT Eval Moderate Complexity: [place], PT, DPT Acute Rehabilitation Services Pager [phone] Office [phone]   [name] 07/22/2020, 12:42 PM

## 2020-07-22 NOTE — Progress Notes (Signed)
[facility] PROGRESS NOTE    Progress Note  ANKIT DEGREGORIO  EGB:[id number] DOB: Oct 02, 1950 DOA: 07/21/2020 PCP: Sharilyn Sites, MD     Brief Narrative:   Kenneth Kelly is an 70 y.o. 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    Assessment/Plan:   Acute CVA (cerebrovascular accident) [facility]): HgbA1c, fasting lipid panel pending MRI, showed a large acute infarct in the PICA territory and multiple acute right occipital infarct with a small chronic right frontal cortical infarct. CT angio head and neck showed occlusion of the right PICA no stenosis in the vertebral arteries plaque in the common carotid artery less than 50%.  Plaque in the right ICA 70% stenosis PT, OT, Speech consult pending Transthoracic Echo,  pending Start patient on ASA 81mg  daily and plavix 75mg  daily  Continue statins BP goal: permissive HTN upto 220/120 mmHg No events telemetry monitoring  Essential hypertension Allow permissive hypertension, hold lisinopriland amlodipine. Continue metoprolol  TOBACCO ABUSE Counseled, placed on a nicotine patch.  Coronary artery disease : continue aspirin and statin. Resume beta-blockers.  AAA: Follow-up with PCP as an outpatient.  Bladder cancer: Follow-up with oncology as scheduled.  Diabetes mellitus type 2 with peripheral neuropathy: Carb modified diet, A1c is pending. Continue gabapentin. Continue sliding scale.  DVT prophylaxis: lovenox Family Communication:none Status is: Inpatient  Remains inpatient appropriate  because:Hemodynamically unstable  Dispo: The patient is from: Home              Anticipated d/c is to: SNF              Patient currently is not medically stable to d/c.   Difficult to place patient No    Code Status:     Code Status Orders  (From admission, onward)           Start     Ordered   07/21/20 1551  Full code  Continuous        07/21/20 1553           Code Status History     Date Active Date Inactive Code Status Order ID Comments User Context   01/23/2016 1400 01/24/2016 1536 Full Code [id number]  [name], MD Inpatient         IV Access:   Peripheral IV   Procedures and diagnostic studies:   CT Angio Head W or Wo Contrast  Result Date: 07/21/2020 CLINICAL DATA:  Vomiting with acute infarct on MRI EXAM: CT ANGIOGRAPHY HEAD AND NECK TECHNIQUE: Multidetector CT imaging of the head and neck was performed using the standard protocol during bolus administration of intravenous contrast. Multiplanar CT image reconstructions and MIPs were obtained to evaluate the vascular anatomy. Carotid stenosis measurements (when applicable) are obtained utilizing NASCET criteria, using the distal internal carotid diameter as the denominator. CONTRAST:  17mL OMNIPAQUE IOHEXOL 350 MG/ML SOLN COMPARISON:  None. FINDINGS: CTA NECK Aortic arch: Calcified plaque along the arch and patent great vessel origins. No high-grade proximal subclavian artery stenosis. Right carotid system: Patent. Calcified plaque along the common carotid with less than 50% stenosis. Mixed plaque the proximal internal carotid causing 70%  stenosis. Left carotid system: Patent. Calcified plaque along the common carotid causing less than 50% stenosis. Mixed plaque along the proximal internal carotid causing 60% stenosis. Additional calcified plaque along the distal cervical ICA with less than 50% stenosis. Vertebral arteries: Patent. Left vertebral artery is dominant. No stenosis. Skeleton: Degenerative changes  of the cervical spine, greatest at C4-C5 and C5-C6. Other neck: Unremarkable. Upper chest: Emphysema. Review of the MIP images confirms the above findings CTA HEAD Anterior circulation: Intracranial internal carotid arteries are patent with diffuse mixed but primarily calcified plaque with moderate to marked stenosis. Anterior and middle cerebral arteries are patent. There is fenestration of the proximal left M1 MCA. Posterior circulation: Intracranial vertebral arteries are patent. There is calcified plaque on the left greater than right. Moderate to marked stenosis on the left. Right PICA origin is patent with occlusion approximately 11 mm beyond the origin. Left PICA origin is patent. Superior cerebellar artery origins are patent. Right posterior communicating artery is present. Possible small left posterior communicating artery. Posterior cerebral arteries are patent with atherosclerotic irregularity. There is mild to moderate stenosis on the left. Venous sinuses: Patent as allowed by contrast bolus timing. Review of the MIP images confirms the above findings IMPRESSION: Occlusion of the right PICA approximately 11 mm beyond the origin. Patent vertebral arteries. No stenosis in the neck. Calcified plaque intracranially on the left greater than right. Plaque along the common carotids causes less than 50% stenosis. Plaque at the right ICA origin causes 70% stenosis. Plaque at the left ICA origin causes 60% stenosis. Plaque along the intracranial internal carotids causes moderate to marked stenosis. Emphysema. Electronically Signed   By: Macy Mis M.D.   On: 07/21/2020 15:04   CT Head Wo Contrast  Addendum Date: 07/21/2020   ADDENDUM REPORT: 07/21/2020 13:48 ADDENDUM: Suspected posterior circulation infarct may extend slightly anterior to the fourth ventricle as well. Electronically Signed   By: Lowella Grip III M.D.   On: 07/21/2020 13:48   Result Date: 07/21/2020 CLINICAL DATA:  Episodes of severe  vomiting EXAM: CT HEAD WITHOUT CONTRAST TECHNIQUE: Contiguous axial images were obtained from the base of the skull through the vertex without intravenous contrast. COMPARISON:  None. FINDINGS: Brain: There is mild diffuse atrophy. There is apparent vasogenic edema in the medial aspect of the posteroinferior right cerebellum extending to the level of the posterior fourth ventricle on the right. Elsewhere there is decreased attenuation in the anterior rostrum of corpus callosum on the right consistent prior infarct in this area. There are white matter infarcts in the left superior centrum semiovale as well as a prior infarct in the posterosuperior left parietal lobe. Periventricular small vessel disease in the centra semiovale bilaterally also noted. No well-defined mass evident. No hemorrhage, extra-axial fluid collection, or midline shift. Vascular: No hyperdense vessel evident. There is calcification in each distal vertebral artery and carotid siphon region. Skull: The bony calvarium appears intact. Sinuses/Orbits: There is mucosal thickening in several ethmoid air cells. Other visualized paranasal sinuses are clear. Orbits appear symmetric bilaterally. Other: Mastoid air cells are clear. IMPRESSION: 1. Suspected acute infarct involving a portion of the right posteroinferior cerebellar artery distribution. There is edema with sulcal effacement in this area extending medially to the inferior aspect of the fourth ventricle on the right. An underlying mass in this area is a less likely differential consideration. Correlation with MR pre and post-contrast to further evaluate may well be warranted. 2. Prior infarct in the superior posterior left parietal lobe. Prior  infarct in rostrum of corpus callosum on the right. White matter infarcts noted in the superior left centrum semiovale which appear chronic. There is periventricular small vessel disease. 3.  No hemorrhage or extra-axial fluid. 4.  Multiple foci of arterial  vascular calcification noted. 5.  Mucosal thickening noted in several ethmoid air cells. Electronically Signed: By: Lowella Grip III M.D. On: 07/21/2020 12:23   CT Angio Neck W and/or Wo Contrast  Result Date: 07/21/2020 CLINICAL DATA:  Vomiting with acute infarct on MRI EXAM: CT ANGIOGRAPHY HEAD AND NECK TECHNIQUE: Multidetector CT imaging of the head and neck was performed using the standard protocol during bolus administration of intravenous contrast. Multiplanar CT image reconstructions and MIPs were obtained to evaluate the vascular anatomy. Carotid stenosis measurements (when applicable) are obtained utilizing NASCET criteria, using the distal internal carotid diameter as the denominator. CONTRAST:  36mL OMNIPAQUE IOHEXOL 350 MG/ML SOLN COMPARISON:  None. FINDINGS: CTA NECK Aortic arch: Calcified plaque along the arch and patent great vessel origins. No high-grade proximal subclavian artery stenosis. Right carotid system: Patent. Calcified plaque along the common carotid with less than 50% stenosis. Mixed plaque the proximal internal carotid causing 70% stenosis. Left carotid system: Patent. Calcified plaque along the common carotid causing less than 50% stenosis. Mixed plaque along the proximal internal carotid causing 60% stenosis. Additional calcified plaque along the distal cervical ICA with less than 50% stenosis. Vertebral arteries: Patent. Left vertebral artery is dominant. No stenosis. Skeleton: Degenerative changes of the cervical spine, greatest at C4-C5 and C5-C6. Other neck: Unremarkable. Upper chest: Emphysema. Review of the MIP images confirms the above findings CTA HEAD Anterior circulation: Intracranial internal carotid arteries are patent with diffuse mixed but primarily calcified plaque with moderate to marked stenosis. Anterior and middle cerebral arteries are patent. There is fenestration of the proximal left M1 MCA. Posterior circulation: Intracranial vertebral arteries are  patent. There is calcified plaque on the left greater than right. Moderate to marked stenosis on the left. Right PICA origin is patent with occlusion approximately 11 mm beyond the origin. Left PICA origin is patent. Superior cerebellar artery origins are patent. Right posterior communicating artery is present. Possible small left posterior communicating artery. Posterior cerebral arteries are patent with atherosclerotic irregularity. There is mild to moderate stenosis on the left. Venous sinuses: Patent as allowed by contrast bolus timing. Review of the MIP images confirms the above findings IMPRESSION: Occlusion of the right PICA approximately 11 mm beyond the origin. Patent vertebral arteries. No stenosis in the neck. Calcified plaque intracranially on the left greater than right. Plaque along the common carotids causes less than 50% stenosis. Plaque at the right ICA origin causes 70% stenosis. Plaque at the left ICA origin causes 60% stenosis. Plaque along the intracranial internal carotids causes moderate to marked stenosis. Emphysema. Electronically Signed   By: Macy Mis M.D.   On: 07/21/2020 15:04   MR Brain W and Wo Contrast  Result Date: 07/21/2020 CLINICAL DATA:  Vomiting, abnormal CT head EXAM: MRI HEAD WITHOUT AND WITH CONTRAST TECHNIQUE: Multiplanar, multiecho pulse sequences of the brain and surrounding structures were obtained without and with intravenous contrast. CONTRAST:  56mL GADAVIST GADOBUTROL 1 MMOL/ML IV SOLN COMPARISON:  Correlation made with same day CT head FINDINGS: Brain: There is reduced diffusion in the right cerebellum involving the PICA territory. A punctate focus of reduced diffusion is also present in the right occipital lobe. Small subacute to chronic right frontal cortical infarct. Chronic left parietal larger than frontal infarcts.  Small chronic infarct of the right ventral corpus callosum. Additional patchy T2 hyperintensity in the supratentorial white matter is  nonspecific but may reflect mild chronic microvascular ischemic changes. There is a 4 mm dural-based enhancing lesion along the left temporal convexity likely reflecting a tiny meningioma (series 21, image 15). A focus of susceptibility hypointensity is present in the region of the left perimesencephalic cistern, midbrain, and thalamus. Unclear if parenchymal or extra-axial. May reflect chronic blood products or mineralization. No significant mass effect. There is no hydrocephalus or extra-axial fluid collection. No abnormal parenchymal enhancement. Vascular: Major vessel flow voids at the skull base are preserved. Skull and upper cervical spine: Normal marrow signal is preserved. Sinuses/Orbits: Paranasal sinuses are aerated. Orbits are unremarkable. Other: Sella is unremarkable. Patchy right mastoid fluid opacification. IMPRESSION: Large acute infarct of the right PICA territory. Punctate acute right occipital infarct. Small subacute to chronic right frontal cortical infarct. Chronic infarcts of left frontoparietal lobes and right ventral corpus callosum. Mild chronic microvascular ischemic changes. Tiny left temporal convexity meningioma. Electronically Signed   By: Macy Mis M.D.   On: 07/21/2020 14:49   US Carotid Bilateral (at [facility] and [facility] only)  Result Date: 07/21/2020 CLINICAL DATA:  Stroke symptoms, large acute right PICA infarct by MR EXAM: BILATERAL CAROTID DUPLEX ULTRASOUND TECHNIQUE: Pearline Cables scale imaging, color Doppler and duplex ultrasound were performed of bilateral carotid and vertebral arteries in the neck. COMPARISON:  07/21/2020 FINDINGS: Criteria: Quantification of carotid stenosis is based on velocity parameters that correlate the residual internal carotid diameter with NASCET-based stenosis levels, using the diameter of the distal internal carotid lumen as the denominator for stenosis measurement. The following velocity measurements were obtained: RIGHT ICA: 435/99 cm/sec CCA: 23/30  cm/sec SYSTOLIC ICA/CCA RATIO:  6.0 ECA: 247 cm/sec LEFT ICA: 158/38 cm/sec CCA: 07/62 cm/sec SYSTOLIC ICA/CCA RATIO:  1.7 ECA: 116 cm/sec RIGHT CAROTID ARTERY: Marked heterogeneous partially calcified bifurcation atherosclerosis. Significant bifurcation and proximal ICA luminal narrowing by grayscale imaging. Proximal ICA velocity elevation measures up to 435/99 centimeters/second with diffuse turbulent flow and spectral broadening. Right ICA stenosis estimated at greater than 70% by ultrasound criteria. RIGHT VERTEBRAL ARTERY: Antegrade flow with some reversed diastolic flow LEFT CAROTID ARTERY: Moderate heterogeneous and calcified bifurcation atherosclerosis. Mild velocity elevation as above. Only minimal turbulent flow with spectral broadening. Left ICA stenosis estimated at 50-69% by ultrasound criteria. LEFT VERTEBRAL ARTERY:  Normal antegrade flow IMPRESSION: Bilateral carotid atherosclerosis, worse on the right. Right ICA stenosis estimated greater than 70% Left ICA stenosis estimated at 50-69% Abnormal right vertebral artery waveform with some reversed diastolic flow. Normal antegrade left vertebral artery flow Electronically Signed   By: Jerilynn Mages.  Shick M.D.   On: 07/21/2020 16:53     Medical Consultants:   None.   Subjective:    AZAIAH LICCIARDI no complaints  Objective:    Vitals:   07/21/20 2143 07/21/20 2327 07/22/20 0200 07/22/20 0329  BP: (!) 193/90 (!) 191/79 (!) 179/81 (!) 164/82  Pulse: 85 75 68 61  Resp: 18 18 16 18   Temp: 98.8 F (37.1 C) 97.7 F (36.5 C) 98.6 F (37 C) 98.3 F (36.8 C)  TempSrc: Oral  Oral Oral  SpO2: 97% 98% 95% 96%  Weight:      Height:       SpO2: 96 %   Intake/Output Summary (Last 24 hours) at 07/22/2020 0649 Last data filed at 07/21/2020 1433 Gross per 24 hour  Intake 500 ml  Output --  Net 500 ml  Filed Weights   07/21/20 1115  Weight: 58.1 kg    Exam: General exam: In no acute distress. Respiratory system: Good air movement and  clear to auscultation. Cardiovascular system: S1 & S2 heard, RRR. No JVD. Gastrointestinal system: Abdomen is nondistended, soft and nontender.  Extremities: No pedal edema. Skin: No rashes, lesions or ulcers Psychiatry: Judgement and insight appear normal. Mood & affect appropriate.    Data Reviewed:    Labs: Basic Metabolic Panel: Recent Labs  Lab 07/21/20 1135  NA 141  K 4.2  CL 101  CO2 26  GLUCOSE 293*  BUN 28*  CREATININE 0.94  CALCIUM 10.0   GFR Estimated Creatinine Clearance: 58 mL/min (by C-G formula based on SCr of 0.94 mg/dL). Liver Function Tests: Recent Labs  Lab 07/21/20 1135  AST 22  ALT 20  ALKPHOS 87  BILITOT 0.5  PROT 8.8*  ALBUMIN 5.0   No results for input(s): LIPASE, AMYLASE in the last 168 hours. No results for input(s): AMMONIA in the last 168 hours. Coagulation profile No results for input(s): INR, PROTIME in the last 168 hours. COVID-19 Labs  No results for input(s): DDIMER, FERRITIN, LDH, CRP in the last 72 hours.  Lab Results  Component Value Date   [place] NEGATIVE 07/21/2020    CBC: Recent Labs  Lab 07/21/20 1135  WBC 10.5  NEUTROABS 9.2*  HGB 15.4  HCT 45.2  MCV 97.2  PLT 221   Cardiac Enzymes: No results for input(s): CKTOTAL, CKMB, CKMBINDEX, TROPONINI in the last 168 hours. BNP (last 3 results) No results for input(s): PROBNP in the last 8760 hours. CBG: Recent Labs  Lab 07/21/20 1711 07/21/20 2206 07/22/20 0624  GLUCAP 191* 140* 117*   D-Dimer: No results for input(s): DDIMER in the last 72 hours. Hgb A1c: No results for input(s): HGBA1C in the last 72 hours. Lipid Profile: No results for input(s): CHOL, HDL, LDLCALC, TRIG, CHOLHDL, LDLDIRECT in the last 72 hours. Thyroid function studies: No results for input(s): TSH, T4TOTAL, T3FREE, THYROIDAB in the last 72 hours.  Invalid input(s): FREET3 Anemia work up: No results for input(s): VITAMINB12, FOLATE, FERRITIN, TIBC, IRON, RETICCTPCT in the  last 72 hours. Sepsis Labs: Recent Labs  Lab 07/21/20 1135  WBC 10.5   Microbiology Recent Results (from the past 240 hour(s))  Resp Panel by RT-PCR (Flu A&B, Covid) Nasopharyngeal Swab     Status: None   Collection Time: 07/21/20  6:51 PM   Specimen: Nasopharyngeal Swab; Nasopharyngeal(NP) swabs in vial transport medium  Result Value Ref Range Status   SARS Coronavirus 2 by RT PCR NEGATIVE NEGATIVE Final    Comment: (NOTE) SARS-CoV-2 target nucleic acids are NOT DETECTED.  The SARS-CoV-2 RNA is generally detectable in upper respiratory specimens during the acute phase of infection. The lowest concentration of SARS-CoV-2 viral copies this assay can detect is 138 copies/mL. A negative result does not preclude SARS-Cov-2 infection and should not be used as the sole basis for treatment or other patient management decisions. A negative result may occur with  improper specimen collection/handling, submission of specimen other than nasopharyngeal swab, presence of viral mutation(s) within the areas targeted by this assay, and inadequate number of viral copies(<138 copies/mL). A negative result must be combined with clinical observations, patient history, and epidemiological information. The expected result is Negative.  Fact Sheet for Patients:  EntrepreneurPulse.com.au  Fact Sheet for Healthcare Providers:  IncredibleEmployment.be  This test is no t yet approved or cleared by the [place] and  has been authorized for detection and/or diagnosis of SARS-CoV-2 by FDA under an Emergency Use Authorization (EUA). This EUA will remain  in effect (meaning this test can be used) for the duration of the COVID-19 declaration under Section 564(b)(1) of the Act, 21 U.S.C.section 360bbb-3(b)(1), unless the authorization is terminated  or revoked sooner.       Influenza A by PCR NEGATIVE NEGATIVE Final   Influenza B by PCR NEGATIVE NEGATIVE Final     Comment: (NOTE) The Xpert Xpress SARS-CoV-2/FLU/RSV plus assay is intended as an aid in the diagnosis of influenza from Nasopharyngeal swab specimens and should not be used as a sole basis for treatment. Nasal washings and aspirates are unacceptable for Xpert Xpress SARS-CoV-2/FLU/RSV testing.  Fact Sheet for Patients: EntrepreneurPulse.com.au  Fact Sheet for Healthcare Providers: IncredibleEmployment.be  This test is not yet approved or cleared by the [place] FDA and has been authorized for detection and/or diagnosis of SARS-CoV-2 by FDA under an Emergency Use Authorization (EUA). This EUA will remain in effect (meaning this test can be used) for the duration of the COVID-19 declaration under Section 564(b)(1) of the Act, 21 U.S.C. section 360bbb-3(b)(1), unless the authorization is terminated or revoked.  Performed at [facility], [address] [place], [place] [address]      Medications:     stroke: mapping our early stages of recovery book   Does not apply Once   aspirin EC  81 mg Oral Daily   enoxaparin (LOVENOX) injection  40 mg Subcutaneous Q24H   gabapentin  900 mg Oral BID   insulin aspart  0-9 Units Subcutaneous TID WC   [START ON 07/23/2020] metFORMIN  500 mg Oral BID WC   simvastatin  20 mg Oral q1800   Continuous Infusions:    LOS: 1 day   Charlynne Cousins  [facility]  07/22/2020, 6:49 AM

## 2020-07-22 NOTE — Progress Notes (Addendum)
STROKE TEAM PROGRESS NOTE   INTERVAL HISTORY No acute events since last neurology visit. Kenneth Kelly is in good spirits and feels much improved since arrival.    PERTINENT IMAGING Imaging from 17Jun2022 resulted as follows:  CT head Suspected acute infarct involving a portion of the right posteroinferior cerebellar artery distribution. There is edema with sulcal effacement in this area extending medially to the inferior aspect of the fourth ventricle on the right. (ADDENDUM: Suspected posterior circulation infarct may extend slightly anterior to the fourth ventricle as well.) An underlying mass in this area is a less likely differential consideration. Prior infarct in the superior posterior left parietal lobe. Prior infarct in rostrum of corpus callosum on the right. White matter infarcts noted in the superior left centrum semiovale which appear chronic. There is periventricular small vessel disease. No hemorrhage or extra-axial fluid. Multiple foci of arterial vascular calcification noted. Mucosal thickening noted in several ethmoid air cells.   MR brain Large acute infarct of the right PICA territory. Punctate acute right occipital infarct. Small subacute to chronic right frontal cortical infarct. Chronic infarcts of left frontoparietal lobes and right ventral corpus callosum. Mild chronic microvascular ischemic changes. Tiny left temporal convexity meningioma.  CTA head/neck Occlusion of the right PICA approximately 11 mm beyond the origin. Patent vertebral arteries. No stenosis in the neck. Calcified plaque intracranially on the left greater than right. plaque along the common carotids causes less than 50% stenosis. Plaque at the right ICA origin causes 70% stenosis. Plaque at the left ICA origin causes 60% stenosis.  Plaque along the intracranial internal carotids causes moderate to marked stenosis.  Carotid US Bilateral carotid atherosclerosis, worse on the right. Right ICA  stenosis estimated greater than 70% Left ICA stenosis estimated at 50-69% Abnormal right vertebral artery waveform with some reversed diastolic flow. Normal antegrade left vertebral artery flow  TTE ([id number]) Left ventricular ejection fraction, by estimation, is 60 to 65%. The  left ventricle has normal function. The left ventricle has no regional  wall motion abnormalities. There is moderate asymmetric left ventricular hypertrophy of the basal-septal  segment. Left ventricular diastolic parameters are consistent with Grade I  diastolic dysfunction (impaired relaxation).  Right ventricular systolic function is mildly reduced. The right ventricular size is mildly enlarged. Tricuspid regurgitation signal is inadequate for assessing PA pressure. ' Left atrial size was moderately dilated.  The mitral valve is abnormal. Trivial mitral valve regurgitation.  The aortic valve is tricuspid. Aortic valve regurgitation is not visualized. Mild aortic valve sclerosis is present, with no evidence of aortic valve stenosis. Aortic valve mean gradient measures 3.5 mmHg.  The inferior vena cava is normal in size with <50% respiratory variability, suggesting right atrial pressure of 8 mmHg.  Agitated saline contrast bubble study was negative, with no evidence of any interatrial shunt.   Vitals:   07/22/20 0329 07/22/20 0837 07/22/20 1141 07/22/20 1600  BP: (!) 164/82 (!) 194/87 (!) 171/89 (!) 142/80  Pulse: 61 85 (!) 51 (!) 50  Resp: 18 18 16 17   Temp: 98.3 F (36.8 C) 98.4 F (36.9 C) 98.1 F (36.7 C) 98.4 F (36.9 C)  TempSrc: Oral Oral Oral Oral  SpO2: 96% 99%  94%  Weight:      Height:        CBC:  Recent Labs  Lab 07/21/20 1135  WBC 10.5  NEUTROABS 9.2*  HGB 15.4  HCT 45.2  MCV 97.2  PLT 517    Basic Metabolic Panel:  Recent Labs  Lab 07/21/20 1135  NA 141  K 4.2  CL 101  CO2 26  GLUCOSE 293*  BUN 28*  CREATININE 0.94  CALCIUM 10.0    Lipid Panel: No results for input(s):  CHOL, TRIG, HDL, CHOLHDL, VLDL, LDLCALC in the last 168 hours. HgbA1c:  Lab Results  Component Value Date   HGBA1C 7.2 (H) 01/22/2016   Urine Drug Screen: No results found for: LABOPIA, COCAINSCRNUR, LABBENZ, AMPHETMU, THCU, LABBARB  Alcohol Level No results found for: Long Neck exam   HEENT-  Normocephalic, no lesions, without obvious abnormality.  Normal external eye and conjunctiva.   Cardiovascular- Perfusing well  Lungs-breathing comfortably on room air Abdomen-soft Extremities-No obvious abnormality Skin-warm and dry, intact  Neurologic Exam: General: NAD, sitting comfortably on the side of the bed Mental Status: Alert, oriented, thought content appropriate.  Speech fluent without evidence of aphasia.  Able to follow commands without difficulty. Cranial Nerves: II:  Visual fields grossly normal, pupils equal, round III,IV, VI: ptosis not present, extra-ocular motions intact bilaterally, saccadic dysmetric present bilaterally V,VII: smile symmetric, facial light touch sensation normal bilaterally VIII: hearing normal bilaterally IX,X: uvula rises symmetrically XI: bilateral shoulder shrug antigravity XII: midline tongue extension without atrophy or fasciculations Motor: Right : Upper extremity   5/5    Left:     Upper extremity   5/5  Lower extremity   5/5     Lower extremity   5/5 Tone and bulk normal throughout; no atrophy noted.  Sensory: Light touch intact throughout, bilaterally Cerebellar: normal finger-to-nose,  normal heel-to-shin test, slightly more challenging on the right vs left. No truncal ataxia when sitting on the bed. Gait: Deferred   ASSESSMENT/PLAN Kenneth Kelly is an 69 y.o. Caucasian male with PMH of significant for cigarette smoking, AAA, bladder tumor, CAD, CABG x3, first-degree AV block, sinus bradycardia, gout, hyperlipidemia, hypertension, type II DM who presented to [facility] with complaints of generalized weakness, nausea and  vomiting while working in his garden on 16Jun that was followed by a headache later in the evening. He decided to come to the hospital the following day due to persistent symptoms.  # Acute right PICA occlusion with stroke. CTA head/neck showed occlusion of the right PICA with classical infarct distribution as well as stenosis in the right ICA (>70%) and left ICA (50-69%). There is edema with sulcal effacement in this area extending medially to the inferior and slightly anterior aspects of the fourth ventricle on the right. Additional infarcts were also identified in the superior posterior left parietal lobe, the rostrum of corpus callosum on the right and white matter infarcts noted in the superior left centrum semiovale which appear chronic. There is periventricular small vessel disease. Cardiac studies showed LVEF of 60-65% with asymmetric LV hypertrophy. The right atrium and left ventricles are slightly enlarged. Although afib has not been seen on telemetry, it is possible that Mr. Eltringham is experiencing asymptomatic afib, putting him at a higher risk for clot development. Treatment and future stroke prevention will focus on major lifestyle changes, including smoking cessation, DM, HTN and HLD control. Continue with stroke workup, as follows. - Follow-up CT ordered - Suggest loop recorder to assess for afib - Labs pending: HgbA1c, lipid panel - UDS pending - CBGs range from 140-216, continue metformin and SSI - If LDL >70, maximize statin - DAPT - 81mg  aspirin + plavix 75mg  x 3 weeks followed by home ASA (81mg ) - SLP cleared for Heart Healthy diet  # CAD -  Continue aspirin, statin - BP control  # HTN - Ranging 140-190s/80s during this stay - Permissive hypertension up to 220/120 and gradually normalize - Continue metoprolol  # HLD - Awaiting lipid panel - Continue home zocor for now  # DM2 - Awaiting HgbA1c - CBGs range from 140-216 - Continue gabapentin - Continue home metformin and  continue SSI  #Tobacco abuse - Discussed the importance of smoking cessation - On a nicotine patch now  #Dispo - Following workup, when medically appropriate. - OT suggests no follow up necessary - PT recommends CIR and rolling walker to assist with ambulation  Hospital day # 1  Solon Augusta, PhD, PA-C Neurology  STROKE MD NOTE : I have personally obtained history,examined this patient, reviewed notes, independently viewed imaging studies, participated in medical decision making and plan of care.ROS completed by me personally and pertinent positives fully documented  I have made any additions or clarifications directly to the above note. Agree with note above.  Patient presented with sudden onset of nausea vomiting and ataxia due to right PICA infarct due to right PICA embolic occlusion cryptogenic etiology.  Recommend check cardiac monitoring for A. fib and he will likely need loop recorder insertion at discharge.  Continue ongoing stroke work-up and aggressive risk factor modification.  Dual antiplatelet therapy of aspirin and Plavix for 3 weeks followed by aspirin alone.  Discussed with patient and Dr. Tammi Klippel.  Greater than 50% time during this 35-minute visit was spent of counseling and coordination of care and discussion with care team about stroke.  Antony Contras, MD Medical Director [place] Pager: [phone] 07/22/2020 5:20 PM

## 2020-07-23 DIAGNOSIS — C679 Malignant neoplasm of bladder, unspecified: Secondary | ICD-10-CM | POA: Diagnosis not present

## 2020-07-23 DIAGNOSIS — I10 Essential (primary) hypertension: Secondary | ICD-10-CM | POA: Diagnosis not present

## 2020-07-23 DIAGNOSIS — I639 Cerebral infarction, unspecified: Secondary | ICD-10-CM | POA: Diagnosis not present

## 2020-07-23 LAB — GLUCOSE, CAPILLARY
Glucose-Capillary: 157 mg/dL — ABNORMAL HIGH (ref 70–99)
Glucose-Capillary: 174 mg/dL — ABNORMAL HIGH (ref 70–99)
Glucose-Capillary: 187 mg/dL — ABNORMAL HIGH (ref 70–99)
Glucose-Capillary: 240 mg/dL — ABNORMAL HIGH (ref 70–99)

## 2020-07-23 MED ORDER — PREDNISONE 20 MG PO TABS
40.0000 mg | ORAL_TABLET | Freq: Every day | ORAL | Status: DC
  Administered 2020-07-23 – 2020-07-24 (×2): 40 mg via ORAL
  Filled 2020-07-23 (×2): qty 2

## 2020-07-23 MED ORDER — LISINOPRIL 10 MG PO TABS
10.0000 mg | ORAL_TABLET | Freq: Every morning | ORAL | Status: DC
  Administered 2020-07-23 – 2020-07-24 (×2): 10 mg via ORAL
  Filled 2020-07-23 (×2): qty 1

## 2020-07-23 MED ORDER — INSULIN ASPART 100 UNIT/ML IJ SOLN
2.0000 [IU] | Freq: Three times a day (TID) | INTRAMUSCULAR | Status: DC
  Administered 2020-07-23 – 2020-07-25 (×6): 2 [IU] via SUBCUTANEOUS

## 2020-07-23 MED ORDER — AMLODIPINE BESYLATE 10 MG PO TABS
10.0000 mg | ORAL_TABLET | Freq: Every morning | ORAL | Status: DC
  Administered 2020-07-23 – 2020-07-24 (×2): 10 mg via ORAL
  Filled 2020-07-23 (×2): qty 1

## 2020-07-23 MED ORDER — INSULIN ASPART 100 UNIT/ML IJ SOLN
0.0000 [IU] | Freq: Every day | INTRAMUSCULAR | Status: DC
  Administered 2020-07-23: 2 [IU] via SUBCUTANEOUS

## 2020-07-23 MED ORDER — INSULIN ASPART 100 UNIT/ML IJ SOLN
0.0000 [IU] | Freq: Three times a day (TID) | INTRAMUSCULAR | Status: DC
  Administered 2020-07-23 – 2020-07-24 (×3): 3 [IU] via SUBCUTANEOUS
  Administered 2020-07-24: 8 [IU] via SUBCUTANEOUS
  Administered 2020-07-24: 5 [IU] via SUBCUTANEOUS
  Administered 2020-07-25: 2 [IU] via SUBCUTANEOUS

## 2020-07-23 NOTE — Progress Notes (Signed)
Physical Therapy Treatment Patient Details Name: Kenneth Kelly MRN: [id number] DOB: 1950/07/31 Today's Date: 07/23/2020    History of Present Illness 70 y.o. 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  Also seen punctuated acute right occipital infarct with subacute right frontal cortical infarct per chart.    PT Comments    Pt progressing towards their physical therapy goals, with increased ambulation distance and requiring less assist overall with mobility. Pt ambulating x 400 feet with a walker at a min guard assist level. Navigating room distances with no AD; however, consistently reaching out for external support. Discussed recommendation to use RW with all mobility; pt verbalized understanding. Updated d/c plan in light of progress.      Follow Up Recommendations  Outpatient PT;Supervision for mobility/OOB     Equipment Recommendations  Rolling walker with 5" wheels (tub bench)    Recommendations for Other Services       Precautions / Restrictions Precautions Precautions: Fall Restrictions Weight Bearing Restrictions: No    Mobility  Bed Mobility               General bed mobility comments: Sitting EOB upon arrival    Transfers Overall transfer level: Needs assistance Equipment used: Rolling walker (2 wheeled) Transfers: Sit to/from Stand Sit to Stand: Supervision            Ambulation/Gait Ambulation/Gait assistance: Min guard Gait Distance (Feet): 400 Feet Assistive device: None;Rolling walker (2 wheeled) Gait Pattern/deviations: Step-through pattern;Decreased stride length Gait velocity: decreased   General Gait  Details: Pt ambulating room distances without RW, often reaching out for external support to steady with slower gait speed. Improved speed and stability using walker, able to perform head turns and stops/starts without overt LOB. Cues provided for walker proximity and left foot neutral   Stairs             Wheelchair Mobility    Modified Rankin (Stroke Patients Only) Modified Rankin (Stroke Patients Only) Pre-Morbid Rankin Score: No symptoms Modified Rankin: Moderately severe disability     Balance Overall balance assessment: Needs assistance Sitting-balance support: Feet supported;No upper extremity supported Sitting balance-Leahy Scale: Good     Standing balance support: During functional activity;Single extremity supported Standing balance-Leahy Scale: Poor Standing balance comment: needs external support                            Cognition Arousal/Alertness: Awake/alert Behavior During Therapy: WFL for tasks assessed/performed Overall Cognitive Status: Impaired/Different from baseline Area of Impairment: Safety/judgement;Awareness                         Safety/Judgement: Decreased awareness of safety;Decreased awareness of deficits Awareness: Emergent          Exercises      General Comments        Pertinent Vitals/Pain Pain Assessment: Faces Faces Pain Scale: Hurts a little bit Pain Location: R elbow Pain Descriptors / Indicators: Aching Pain Intervention(s): Other (comment) (RN notified (pt stating gout pain))    Home Living  Prior Function            PT Goals (current goals can now be found in the care plan section) Acute Rehab PT Goals Patient Stated Goal: hoping to get back home PT Goal Formulation: With patient Time For Goal Achievement: 08/05/20 Potential to Achieve Goals: Good Progress towards PT goals: Progressing toward goals    Frequency    Min 4X/week      PT Plan  Discharge plan needs to be updated    Co-evaluation              AM-PAC PT "6 Clicks" Mobility   Outcome Measure  Help needed turning from your back to your side while in a flat bed without using bedrails?: None Help needed moving from lying on your back to sitting on the side of a flat bed without using bedrails?: None Help needed moving to and from a bed to a chair (including a wheelchair)?: A Little Help needed standing up from a chair using your arms (e.g., wheelchair or bedside chair)?: A Little Help needed to walk in hospital room?: A Little Help needed climbing 3-5 steps with a railing? : A Little 6 Click Score: 20    End of Session Equipment Utilized During Treatment: Gait belt Activity Tolerance: Patient tolerated treatment well Patient left: in bed;with call bell/phone within reach;with bed alarm set Nurse Communication: Mobility status PT Visit Diagnosis: Unsteadiness on feet (R26.81)     Time: 1220-1240 PT Time Calculation (min) (ACUTE ONLY): 20 min  Charges:  $Gait Training: 8-22 mins                     Kenneth Kelly, PT, DPT Acute Rehabilitation Services Pager [phone] Office [phone]    Kenneth Kelly 07/23/2020, 1:40 PM

## 2020-07-23 NOTE — Progress Notes (Signed)
[facility] PROGRESS NOTE    Progress Note  JATHAN BALLING  RKY:[id number] DOB: 02/16/1950 DOA: 07/21/2020 PCP: Sharilyn Sites, MD     Brief Narrative:   Kenneth Kelly is an 70 y.o. 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    Assessment/Plan:   Acute CVA (cerebrovascular accident) [facility]): HgbA1c pending, fasting lipid panel HDL greater than 40 LDL 82 on a statin. MRI, showed a large acute infarct in the PICA territory and multiple acute right occipital infarct with a small chronic right frontal cortical infarct. PT, OT, recommended inpatient rehab. Transthoracic Echo, no acute findings EF 31% grade 1 diastolic heart failure Start patient on ASA 81mg  daily and plavix 75mg  daily  Continue statins BP goal: permissive HTN upto 220/120 mmHg Neurology has been consulted they are concerned for paroxysmal A. fib.Recommend check cardiac monitoring for A. fib and he will likely need loop recorder insertion at discharge.  Essential hypertension: Resume lisinopril and amlodipine. Continue metoprolol  TOBACCO ABUSE: Counseled, placed on a nicotine patch.  Coronary artery disease : continue aspirin and statin. Resume beta-blockers.  AAA: Follow-up with PCP as an outpatient.  Bladder cancer: Follow-up with oncology as scheduled.  Diabetes mellitus type 2 with peripheral neuropathy: Carb modified diet, A1c is pending. Continue gabapentin and metformin. Continue sliding scale insulin blood glucose fairly controlled.  DVT prophylaxis: lovenox Family  Communication:none Status is: Inpatient  Remains inpatient appropriate because:Hemodynamically unstable  Dispo: The patient is from: Home              Anticipated d/c is to: SNF              Patient currently is not medically stable to d/c.   Difficult to place patient No    Code Status:     Code Status Orders  (From admission, onward)           Start     Ordered   07/21/20 1551  Full code  Continuous        07/21/20 1553           Code Status History     Date Active Date Inactive Code Status Order ID Comments User Context   01/23/2016 1400 01/24/2016 1536 Full Code [id number]  [name], MD Inpatient         IV Access:   Peripheral IV   Procedures and diagnostic studies:   CT Angio Head W or Wo Contrast  Result Date: 07/21/2020 CLINICAL DATA:  Vomiting with acute infarct on MRI EXAM: CT ANGIOGRAPHY HEAD AND NECK TECHNIQUE: Multidetector CT imaging of the head and neck was performed using the standard protocol during bolus administration of intravenous contrast. Multiplanar CT image reconstructions and MIPs were obtained to evaluate the vascular anatomy. Carotid stenosis measurements (when applicable) are obtained utilizing NASCET criteria, using the distal internal carotid diameter as the denominator. CONTRAST:  51mL OMNIPAQUE IOHEXOL 350 MG/ML SOLN COMPARISON:  None. FINDINGS: CTA NECK Aortic arch: Calcified plaque along the arch and patent great vessel origins. No high-grade proximal subclavian artery stenosis. Right carotid system: Patent. Calcified plaque along the common carotid with less  than 50% stenosis. Mixed plaque the proximal internal carotid causing 70% stenosis. Left carotid system: Patent. Calcified plaque along the common carotid causing less than 50% stenosis. Mixed plaque along the proximal internal carotid causing 60% stenosis. Additional calcified plaque along the distal cervical ICA with less than 50% stenosis. Vertebral arteries: Patent. Left  vertebral artery is dominant. No stenosis. Skeleton: Degenerative changes of the cervical spine, greatest at C4-C5 and C5-C6. Other neck: Unremarkable. Upper chest: Emphysema. Review of the MIP images confirms the above findings CTA HEAD Anterior circulation: Intracranial internal carotid arteries are patent with diffuse mixed but primarily calcified plaque with moderate to marked stenosis. Anterior and middle cerebral arteries are patent. There is fenestration of the proximal left M1 MCA. Posterior circulation: Intracranial vertebral arteries are patent. There is calcified plaque on the left greater than right. Moderate to marked stenosis on the left. Right PICA origin is patent with occlusion approximately 11 mm beyond the origin. Left PICA origin is patent. Superior cerebellar artery origins are patent. Right posterior communicating artery is present. Possible small left posterior communicating artery. Posterior cerebral arteries are patent with atherosclerotic irregularity. There is mild to moderate stenosis on the left. Venous sinuses: Patent as allowed by contrast bolus timing. Review of the MIP images confirms the above findings IMPRESSION: Occlusion of the right PICA approximately 11 mm beyond the origin. Patent vertebral arteries. No stenosis in the neck. Calcified plaque intracranially on the left greater than right. Plaque along the common carotids causes less than 50% stenosis. Plaque at the right ICA origin causes 70% stenosis. Plaque at the left ICA origin causes 60% stenosis. Plaque along the intracranial internal carotids causes moderate to marked stenosis. Emphysema. Electronically Signed   By: Macy Mis M.D.   On: 07/21/2020 15:04   CT HEAD WO CONTRAST  Result Date: 07/22/2020 CLINICAL DATA:  Stroke follow-up EXAM: CT HEAD WITHOUT CONTRAST TECHNIQUE: Contiguous axial images were obtained from the base of the skull through the vertex without intravenous contrast. COMPARISON:  None.  FINDINGS: Brain: Subacute infarct of the right cerebellum. There is an old posterior left parietal infarct. Mass effect on the fourth ventricle without hydrocephalus. There is periventricular hypoattenuation compatible with chronic microvascular disease. Vascular: Atherosclerotic calcification of the vertebral and internal carotid arteries at the skull base. No abnormal hyperdensity of the major intracranial arteries or dural venous sinuses. Skull: The visualized skull base, calvarium and extracranial soft tissues are normal. Sinuses/Orbits: No fluid levels or advanced mucosal thickening of the visualized paranasal sinuses. No mastoid or middle ear effusion. The orbits are normal. IMPRESSION: 1. Subacute infarct of the right cerebellum. Mass effect on the fourth ventricle without hydrocephalus. 2. Old posterior left parietal infarct. Electronically Signed   By: Ulyses Jarred M.D.   On: 07/22/2020 19:21   CT Head Wo Contrast  Addendum Date: 07/21/2020   ADDENDUM REPORT: 07/21/2020 13:48 ADDENDUM: Suspected posterior circulation infarct may extend slightly anterior to the fourth ventricle as well. Electronically Signed   By: Lowella Grip III M.D.   On: 07/21/2020 13:48   Result Date: 07/21/2020 CLINICAL DATA:  Episodes of severe vomiting EXAM: CT HEAD WITHOUT CONTRAST TECHNIQUE: Contiguous axial images were obtained from the base of the skull through the vertex without intravenous contrast. COMPARISON:  None. FINDINGS: Brain: There is mild diffuse atrophy. There is apparent vasogenic edema in the medial aspect of the posteroinferior right cerebellum extending to the level of the posterior fourth ventricle on the right. Elsewhere there is decreased attenuation in the  anterior rostrum of corpus callosum on the right consistent prior infarct in this area. There are white matter infarcts in the left superior centrum semiovale as well as a prior infarct in the posterosuperior left parietal lobe. Periventricular  small vessel disease in the centra semiovale bilaterally also noted. No well-defined mass evident. No hemorrhage, extra-axial fluid collection, or midline shift. Vascular: No hyperdense vessel evident. There is calcification in each distal vertebral artery and carotid siphon region. Skull: The bony calvarium appears intact. Sinuses/Orbits: There is mucosal thickening in several ethmoid air cells. Other visualized paranasal sinuses are clear. Orbits appear symmetric bilaterally. Other: Mastoid air cells are clear. IMPRESSION: 1. Suspected acute infarct involving a portion of the right posteroinferior cerebellar artery distribution. There is edema with sulcal effacement in this area extending medially to the inferior aspect of the fourth ventricle on the right. An underlying mass in this area is a less likely differential consideration. Correlation with MR pre and post-contrast to further evaluate may well be warranted. 2. Prior infarct in the superior posterior left parietal lobe. Prior infarct in rostrum of corpus callosum on the right. White matter infarcts noted in the superior left centrum semiovale which appear chronic. There is periventricular small vessel disease. 3.  No hemorrhage or extra-axial fluid. 4.  Multiple foci of arterial vascular calcification noted. 5.  Mucosal thickening noted in several ethmoid air cells. Electronically Signed: By: Lowella Grip III M.D. On: 07/21/2020 12:23   CT Angio Neck W and/or Wo Contrast  Result Date: 07/21/2020 CLINICAL DATA:  Vomiting with acute infarct on MRI EXAM: CT ANGIOGRAPHY HEAD AND NECK TECHNIQUE: Multidetector CT imaging of the head and neck was performed using the standard protocol during bolus administration of intravenous contrast. Multiplanar CT image reconstructions and MIPs were obtained to evaluate the vascular anatomy. Carotid stenosis measurements (when applicable) are obtained utilizing NASCET criteria, using the distal internal carotid  diameter as the denominator. CONTRAST:  85mL OMNIPAQUE IOHEXOL 350 MG/ML SOLN COMPARISON:  None. FINDINGS: CTA NECK Aortic arch: Calcified plaque along the arch and patent great vessel origins. No high-grade proximal subclavian artery stenosis. Right carotid system: Patent. Calcified plaque along the common carotid with less than 50% stenosis. Mixed plaque the proximal internal carotid causing 70% stenosis. Left carotid system: Patent. Calcified plaque along the common carotid causing less than 50% stenosis. Mixed plaque along the proximal internal carotid causing 60% stenosis. Additional calcified plaque along the distal cervical ICA with less than 50% stenosis. Vertebral arteries: Patent. Left vertebral artery is dominant. No stenosis. Skeleton: Degenerative changes of the cervical spine, greatest at C4-C5 and C5-C6. Other neck: Unremarkable. Upper chest: Emphysema. Review of the MIP images confirms the above findings CTA HEAD Anterior circulation: Intracranial internal carotid arteries are patent with diffuse mixed but primarily calcified plaque with moderate to marked stenosis. Anterior and middle cerebral arteries are patent. There is fenestration of the proximal left M1 MCA. Posterior circulation: Intracranial vertebral arteries are patent. There is calcified plaque on the left greater than right. Moderate to marked stenosis on the left. Right PICA origin is patent with occlusion approximately 11 mm beyond the origin. Left PICA origin is patent. Superior cerebellar artery origins are patent. Right posterior communicating artery is present. Possible small left posterior communicating artery. Posterior cerebral arteries are patent with atherosclerotic irregularity. There is mild to moderate stenosis on the left. Venous sinuses: Patent as allowed by contrast bolus timing. Review of the MIP images confirms the above findings IMPRESSION: Occlusion of the right PICA  approximately 11 mm beyond the origin. Patent  vertebral arteries. No stenosis in the neck. Calcified plaque intracranially on the left greater than right. Plaque along the common carotids causes less than 50% stenosis. Plaque at the right ICA origin causes 70% stenosis. Plaque at the left ICA origin causes 60% stenosis. Plaque along the intracranial internal carotids causes moderate to marked stenosis. Emphysema. Electronically Signed   By: Macy Mis M.D.   On: 07/21/2020 15:04   MR Brain W and Wo Contrast  Result Date: 07/21/2020 CLINICAL DATA:  Vomiting, abnormal CT head EXAM: MRI HEAD WITHOUT AND WITH CONTRAST TECHNIQUE: Multiplanar, multiecho pulse sequences of the brain and surrounding structures were obtained without and with intravenous contrast. CONTRAST:  13mL GADAVIST GADOBUTROL 1 MMOL/ML IV SOLN COMPARISON:  Correlation made with same day CT head FINDINGS: Brain: There is reduced diffusion in the right cerebellum involving the PICA territory. A punctate focus of reduced diffusion is also present in the right occipital lobe. Small subacute to chronic right frontal cortical infarct. Chronic left parietal larger than frontal infarcts. Small chronic infarct of the right ventral corpus callosum. Additional patchy T2 hyperintensity in the supratentorial white matter is nonspecific but may reflect mild chronic microvascular ischemic changes. There is a 4 mm dural-based enhancing lesion along the left temporal convexity likely reflecting a tiny meningioma (series 21, image 15). A focus of susceptibility hypointensity is present in the region of the left perimesencephalic cistern, midbrain, and thalamus. Unclear if parenchymal or extra-axial. May reflect chronic blood products or mineralization. No significant mass effect. There is no hydrocephalus or extra-axial fluid collection. No abnormal parenchymal enhancement. Vascular: Major vessel flow voids at the skull base are preserved. Skull and upper cervical spine: Normal marrow signal is preserved.  Sinuses/Orbits: Paranasal sinuses are aerated. Orbits are unremarkable. Other: Sella is unremarkable. Patchy right mastoid fluid opacification. IMPRESSION: Large acute infarct of the right PICA territory. Punctate acute right occipital infarct. Small subacute to chronic right frontal cortical infarct. Chronic infarcts of left frontoparietal lobes and right ventral corpus callosum. Mild chronic microvascular ischemic changes. Tiny left temporal convexity meningioma. Electronically Signed   By: Macy Mis M.D.   On: 07/21/2020 14:49   US Carotid Bilateral (at [facility] and [facility] only)  Result Date: 07/21/2020 CLINICAL DATA:  Stroke symptoms, large acute right PICA infarct by MR EXAM: BILATERAL CAROTID DUPLEX ULTRASOUND TECHNIQUE: Pearline Cables scale imaging, color Doppler and duplex ultrasound were performed of bilateral carotid and vertebral arteries in the neck. COMPARISON:  07/21/2020 FINDINGS: Criteria: Quantification of carotid stenosis is based on velocity parameters that correlate the residual internal carotid diameter with NASCET-based stenosis levels, using the diameter of the distal internal carotid lumen as the denominator for stenosis measurement. The following velocity measurements were obtained: RIGHT ICA: 435/99 cm/sec CCA: 73/71 cm/sec SYSTOLIC ICA/CCA RATIO:  6.0 ECA: 247 cm/sec LEFT ICA: 158/38 cm/sec CCA: 06/26 cm/sec SYSTOLIC ICA/CCA RATIO:  1.7 ECA: 116 cm/sec RIGHT CAROTID ARTERY: Marked heterogeneous partially calcified bifurcation atherosclerosis. Significant bifurcation and proximal ICA luminal narrowing by grayscale imaging. Proximal ICA velocity elevation measures up to 435/99 centimeters/second with diffuse turbulent flow and spectral broadening. Right ICA stenosis estimated at greater than 70% by ultrasound criteria. RIGHT VERTEBRAL ARTERY: Antegrade flow with some reversed diastolic flow LEFT CAROTID ARTERY: Moderate heterogeneous and calcified bifurcation atherosclerosis. Mild velocity elevation  as above. Only minimal turbulent flow with spectral broadening. Left ICA stenosis estimated at 50-69% by ultrasound criteria. LEFT VERTEBRAL ARTERY:  Normal antegrade flow IMPRESSION: Bilateral carotid atherosclerosis,  worse on the right. Right ICA stenosis estimated greater than 70% Left ICA stenosis estimated at 50-69% Abnormal right vertebral artery waveform with some reversed diastolic flow. Normal antegrade left vertebral artery flow Electronically Signed   By: Jerilynn Mages.  Shick M.D.   On: 07/21/2020 16:53   ECHOCARDIOGRAM COMPLETE BUBBLE STUDY  Result Date: 07/22/2020    ECHOCARDIOGRAM REPORT   Patient Name:   JAYIN DEROUSSE Date of Exam: 07/22/2020 Medical Rec #:  [id number]      Height:       63.0 in Accession #:    [id number]     Weight:       128.0 lb Date of Birth:  1950/12/27      BSA:          1.600 m Patient Age:    71 years       BP:           164/82 mmHg Patient Gender: M              HR:           61 bpm. Exam Location:  Inpatient Procedure: 2D Echo, Cardiac Doppler and Color Doppler Indications:    Stroke  History:        Patient has no prior history of Echocardiogram examinations.                 Prior CABG; Risk Factors:Hypertension, Dyslipidemia, Diabetes                 and Current Smoker.  Sonographer:    Cammy Brochure Referring Phys: [id number] [place]  1. Left ventricular ejection fraction, by estimation, is 60 to 65%. The left ventricle has normal function. The left ventricle has no regional wall motion abnormalities. There is moderate asymmetric left ventricular hypertrophy of the basal-septal segment. Left ventricular diastolic parameters are consistent with Grade I diastolic dysfunction (impaired relaxation).  2. Right ventricular systolic function is mildly reduced. The right ventricular size is mildly enlarged. Tricuspid regurgitation signal is inadequate for assessing PA pressure.  3. Left atrial size was moderately dilated.  4. The mitral valve is abnormal. Trivial  mitral valve regurgitation.  5. The aortic valve is tricuspid. Aortic valve regurgitation is not visualized. Mild aortic valve sclerosis is present, with no evidence of aortic valve stenosis. Aortic valve mean gradient measures 3.5 mmHg.  6. The inferior vena cava is normal in size with <50% respiratory variability, suggesting right atrial pressure of 8 mmHg.  7. Agitated saline contrast bubble study was negative, with no evidence of any interatrial shunt. Comparison(s): No prior Echocardiogram. FINDINGS  Left Ventricle: Left ventricular ejection fraction, by estimation, is 60 to 65%. The left ventricle has normal function. The left ventricle has no regional wall motion abnormalities. The left ventricular internal cavity size was normal in size. There is  moderate asymmetric left ventricular hypertrophy of the basal-septal segment. Left ventricular diastolic parameters are consistent with Grade I diastolic dysfunction (impaired relaxation). Indeterminate filling pressures. Right Ventricle: The right ventricular size is mildly enlarged. No increase in right ventricular wall thickness. Right ventricular systolic function is mildly reduced. Tricuspid regurgitation signal is inadequate for assessing PA pressure. Left Atrium: Left atrial size was moderately dilated. Right Atrium: Right atrial size was normal in size. Pericardium: There is no evidence of pericardial effusion. Mitral Valve: The mitral valve is abnormal. There is mild thickening of the mitral valve leaflet(s). Trivial mitral valve regurgitation. Tricuspid Valve: The tricuspid valve is  grossly normal. Tricuspid valve regurgitation is trivial. Aortic Valve: The aortic valve is tricuspid. Aortic valve regurgitation is not visualized. Mild aortic valve sclerosis is present, with no evidence of aortic valve stenosis. Aortic valve mean gradient measures 3.5 mmHg. Aortic valve peak gradient measures 7.2 mmHg. Aortic valve area, by VTI measures 2.71 cm. Pulmonic  Valve: The pulmonic valve was normal in structure. Pulmonic valve regurgitation is not visualized. Aorta: The aortic root and ascending aorta are structurally normal, with no evidence of dilitation. Venous: The inferior vena cava is normal in size with less than 50% respiratory variability, suggesting right atrial pressure of 8 mmHg. IAS/Shunts: The interatrial septum appears to be lipomatous. No atrial level shunt detected by color flow Doppler. Agitated saline contrast was given intravenously to evaluate for intracardiac shunting. Agitated saline contrast bubble study was negative,  with no evidence of any interatrial shunt. EKG: Rhythm strip during this exam demostrated sinus bradycardia.  LEFT VENTRICLE PLAX 2D LVIDd:         4.20 cm  Diastology LVIDs:         2.80 cm  LV e' medial:    3.81 cm/s LV PW:         1.00 cm  LV E/e' medial:  14.8 LV IVS:        1.50 cm  LV e' lateral:   5.55 cm/s LVOT diam:     2.00 cm  LV E/e' lateral: 10.1 LV SV:         80 LV SV Index:   50 LVOT Area:     3.14 cm  RIGHT VENTRICLE            IVC RV Basal diam:  4.40 cm    IVC diam: 1.50 cm RV S prime:     4.84 cm/s LEFT ATRIUM             Index       RIGHT ATRIUM           Index LA diam:        3.90 cm 2.44 cm/m  RA Area:     14.30 cm LA Vol (A2C):   65.9 ml 41.20 ml/m RA Volume:   30.90 ml  19.32 ml/m LA Vol (A4C):   53.0 ml 33.13 ml/m LA Biplane Vol: 61.4 ml 38.39 ml/m  AORTIC VALVE AV Area (Vmax):    2.81 cm AV Area (Vmean):   2.62 cm AV Area (VTI):     2.71 cm AV Vmax:           134.00 cm/s AV Vmean:          85.850 cm/s AV VTI:            0.294 m AV Peak Grad:      7.2 mmHg AV Mean Grad:      3.5 mmHg LVOT Vmax:         120.00 cm/s LVOT Vmean:        71.700 cm/s LVOT VTI:          0.254 m LVOT/AV VTI ratio: 0.86  AORTA Ao Root diam: 3.40 cm Ao Asc diam:  3.70 cm MITRAL VALVE MV Area (PHT): 2.80 cm    SHUNTS MV Decel Time: 271 msec    Systemic VTI:  0.25 m MV E velocity: 56.20 cm/s  Systemic Diam: 2.00 cm MV A  velocity: 44.70 cm/s MV E/A ratio:  1.26 Lyman Bishop MD Electronically signed by Lyman Bishop MD Signature Date/Time: 07/22/2020/2:48:05 PM  Final      Medical Consultants:   None.   Subjective:    SAMIER JACO no complaints Objective:    Vitals:   07/23/20 0428 07/23/20 0429 07/23/20 0821 07/23/20 0823  BP: 133/88  (!) 176/79   Pulse: 60  (!) 57 60  Resp: 18  18   Temp: 97.6 F (36.4 C)  98.5 F (36.9 C)   TempSrc: Oral  Oral   SpO2: 90% 96% 96%   Weight:      Height:       SpO2: 96 %  No intake or output data in the 24 hours ending 07/23/20 0828  Filed Weights   07/21/20 1115  Weight: 58.1 kg    Exam: General exam: In no acute distress. Respiratory system: Good air movement and clear to auscultation. Cardiovascular system: S1 & S2 heard, RRR. No JVD. Gastrointestinal system: Abdomen is nondistended, soft and nontender.  Extremities: No pedal edema. Skin: No rashes, lesions or ulcers Psychiatry: Judgement and insight appear normal. Mood & affect appropriate.   Data Reviewed:    Labs: Basic Metabolic Panel: Recent Labs  Lab 07/21/20 1135  NA 141  K 4.2  CL 101  CO2 26  GLUCOSE 293*  BUN 28*  CREATININE 0.94  CALCIUM 10.0    GFR Estimated Creatinine Clearance: 58 mL/min (by C-G formula based on SCr of 0.94 mg/dL). Liver Function Tests: Recent Labs  Lab 07/21/20 1135  AST 22  ALT 20  ALKPHOS 87  BILITOT 0.5  PROT 8.8*  ALBUMIN 5.0    No results for input(s): LIPASE, AMYLASE in the last 168 hours. No results for input(s): AMMONIA in the last 168 hours. Coagulation profile No results for input(s): INR, PROTIME in the last 168 hours. COVID-19 Labs  No results for input(s): DDIMER, FERRITIN, LDH, CRP in the last 72 hours.  Lab Results  Component Value Date   [place] NEGATIVE 07/21/2020    CBC: Recent Labs  Lab 07/21/20 1135  WBC 10.5  NEUTROABS 9.2*  HGB 15.4  HCT 45.2  MCV 97.2  PLT 221    Cardiac  Enzymes: No results for input(s): CKTOTAL, CKMB, CKMBINDEX, TROPONINI in the last 168 hours. BNP (last 3 results) No results for input(s): PROBNP in the last 8760 hours. CBG: Recent Labs  Lab 07/22/20 0624 07/22/20 1138 07/22/20 1624 07/22/20 2127 07/23/20 0613  GLUCAP 117* 216* 175* 189* 157*    D-Dimer: No results for input(s): DDIMER in the last 72 hours. Hgb A1c: No results for input(s): HGBA1C in the last 72 hours. Lipid Profile: Recent Labs    07/22/20 1730  CHOL 165  HDL 55  LDLCALC 82  TRIG 142  CHOLHDL 3.0   Thyroid function studies: No results for input(s): TSH, T4TOTAL, T3FREE, THYROIDAB in the last 72 hours.  Invalid input(s): FREET3 Anemia work up: No results for input(s): VITAMINB12, FOLATE, FERRITIN, TIBC, IRON, RETICCTPCT in the last 72 hours. Sepsis Labs: Recent Labs  Lab 07/21/20 1135  WBC 10.5    Microbiology Recent Results (from the past 240 hour(s))  Resp Panel by RT-PCR (Flu A&B, Covid) Nasopharyngeal Swab     Status: None   Collection Time: 07/21/20  6:51 PM   Specimen: Nasopharyngeal Swab; Nasopharyngeal(NP) swabs in vial transport medium  Result Value Ref Range Status   SARS Coronavirus 2 by RT PCR NEGATIVE NEGATIVE Final    Comment: (NOTE) SARS-CoV-2 target nucleic acids are NOT DETECTED.  The SARS-CoV-2 RNA is generally detectable in upper respiratory specimens  during the acute phase of infection. The lowest concentration of SARS-CoV-2 viral copies this assay can detect is 138 copies/mL. A negative result does not preclude SARS-Cov-2 infection and should not be used as the sole basis for treatment or other patient management decisions. A negative result may occur with  improper specimen collection/handling, submission of specimen other than nasopharyngeal swab, presence of viral mutation(s) within the areas targeted by this assay, and inadequate number of viral copies(<138 copies/mL). A negative result must be combined  with clinical observations, patient history, and epidemiological information. The expected result is Negative.  Fact Sheet for Patients:  EntrepreneurPulse.com.au  Fact Sheet for Healthcare Providers:  IncredibleEmployment.be  This test is no t yet approved or cleared by the [place] FDA and  has been authorized for detection and/or diagnosis of SARS-CoV-2 by FDA under an Emergency Use Authorization (EUA). This EUA will remain  in effect (meaning this test can be used) for the duration of the COVID-19 declaration under Section 564(b)(1) of the Act, 21 U.S.C.section 360bbb-3(b)(1), unless the authorization is terminated  or revoked sooner.       Influenza A by PCR NEGATIVE NEGATIVE Final   Influenza B by PCR NEGATIVE NEGATIVE Final    Comment: (NOTE) The Xpert Xpress SARS-CoV-2/FLU/RSV plus assay is intended as an aid in the diagnosis of influenza from Nasopharyngeal swab specimens and should not be used as a sole basis for treatment. Nasal washings and aspirates are unacceptable for Xpert Xpress SARS-CoV-2/FLU/RSV testing.  Fact Sheet for Patients: EntrepreneurPulse.com.au  Fact Sheet for Healthcare Providers: IncredibleEmployment.be  This test is not yet approved or cleared by the [place] FDA and has been authorized for detection and/or diagnosis of SARS-CoV-2 by FDA under an Emergency Use Authorization (EUA). This EUA will remain in effect (meaning this test can be used) for the duration of the COVID-19 declaration under Section 564(b)(1) of the Act, 21 U.S.C. section 360bbb-3(b)(1), unless the authorization is terminated or revoked.  Performed at [facility], [address] [place], [place] [address]      Medications:     stroke: mapping our early stages of recovery book   Does not apply Once   aspirin EC  81 mg Oral Daily   clopidogrel  75 mg Oral Daily   enoxaparin (LOVENOX)  injection  40 mg Subcutaneous Q24H   feeding supplement  237 mL Oral BID BM   gabapentin  900 mg Oral BID   insulin aspart  0-9 Units Subcutaneous TID WC   metFORMIN  500 mg Oral BID WC   metoprolol tartrate  50 mg Oral BID   multivitamin with minerals  1 tablet Oral Daily   simvastatin  20 mg Oral q1800   Continuous Infusions:    LOS: 2 days   Charlynne Cousins  [facility]  07/23/2020, 8:28 AM

## 2020-07-23 NOTE — Progress Notes (Signed)
Pt reported "gout pain" to RN for his right elbow which was assessed to be warm to touch and slightly swollen. MD notified and new order received. Will continue to closely monitor pt. Delia Heady RN

## 2020-07-23 NOTE — Evaluation (Signed)
Speech Language Pathology Evaluation Patient Details Name: Kenneth Kelly MRN: [id number] DOB: November 02, 1950 Today's Date: 07/23/2020 Time: 8119-1478 SLP Time Calculation (min) (ACUTE ONLY): 20 min  Problem List:  Patient Active Problem List   Diagnosis Date Noted   Acute CVA (cerebrovascular accident) ([place]) 07/21/2020   Type 2 diabetes mellitus ([place]) 02/26/2016   Bladder cancer ([place]) 01/23/2016   Hyperlipidemia 02/21/2009   OTHER SPEC FORMS CHRONIC ISCHEMIC HEART DISEASE 02/21/2009   SHORTNESS OF BREATH 02/21/2009   TOBACCO ABUSE 02/13/2009   Hypertension 02/13/2009   CAD (coronary artery disease) 02/13/2009   Past Medical History:  Past Medical History:  Diagnosis Date   Abdominal aortic aneurysm (AAA) ([place]) followed by cardiologist-- dr Bronson Ing   per last CT 01-12-2016  Infrarenal aortic aneurysm 3.4cm   Arthritis    Hip   Bladder tumor    CAD (coronary artery disease)    CARDIOLOGIST-  DR Bronson Ing---  HX CABG X3 08-15-1999   First degree AV block    Gout    History of bladder cancer urologist-  dr Jeffie Pollock   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   History of fracture of pelvis    Hyperlipidemia 02/21/2009   Qualifier: Diagnosis of  By: Via LPN, Jeani Hawking     Hypertension    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   LIMA to LCFx, SVG to RCA, RIMA to LAD   Sinus bradycardia on ECG    Type 2 diabetes mellitus ([place])    Past Surgical History:  Past Surgical History:  Procedure Laterality Date   CARDIAC CATHETERIZATION  07-20-1999  dr wall   significant 2v CAD, total occlusion LAD an 80% LCFx neither amenable to PCI   CARDIOVASCULAR STRESS TEST  04-23-2016  dr Bronson Ing ([place])   Low risk nuclear study w/ small, mild intensity, reversible mid to apical inferior defect that is consistent to ischemia but no diagnotic ST segment changes to indicate ischemia/  normal LV function and wall motion ,  nuclear stress ef 57%   CORONARY ARTERY BYPASS GRAFT  08/15/1999   dr gerhardt  [facility]   x3, LIMA to left circumflex, SVG to RCA, and right internal mammary artery graft to the LAD   CYSTOSCOPY WITH BIOPSY N/A 04/08/2017   Procedure: CYSTOSCOPY;  Surgeon: [name], MD;  Location: [facility];  Service: Urology;  Laterality: N/A;   CYSTOSCOPY WITH STENT PLACEMENT Right 01/23/2016   Procedure: CYSTOSCOPY WITH STENT PLACEMENT;  Surgeon: [name], MD;  Location: [facility];  Service: Urology;  Laterality: Right;   KNEE ARTHROSCOPY Right 2001  approx.   POSTERIOR LAMINECTOMY / DECOMPRESSION LUMBAR SPINE  09-01-2009   [facility]   TRANSURETHRAL RESECTION OF BLADDER TUMOR WITH MITOMYCIN-C Bilateral 01/23/2016   Procedure: CYSTOSCOPY ,TRANSURETHRAL RESECTION OF BLADDER TUMOR;  Surgeon: [name], MD;  Location: [facility];  Service: Urology;  Laterality: Bilateral;   HPI:  70 y.o. 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  Also seen punctuated acute right occipital infarct with subacute right frontal cortical infarct per chart.   Assessment / Plan / Recommendation Clinical Impression  Patient presents with a mild cognitive impairment however difficult to fully determine if patient close to  baseline. He reported reaching 7th grade in school and retired "67, 52, 10 years ago". He lives with his brother and sister in law and reports that he is not very active. 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 Patient did not appear to be fully aware of deficit impact on his ability to perform ADL's, however he also did not appear impulsive. SLP is  recommending HH or OP SLP evaluation (depending on POC) upon discharge from hospital.    SLP Assessment  SLP Recommendation/Assessment: All further Speech Lanaguage Pathology  needs can be addressed in the next venue of care SLP Visit Diagnosis: Cognitive communication deficit (R41.841)    Follow Up Recommendations  24 hour supervision/assistance;Outpatient SLP;Home health SLP (OP or HH depending on overall POC)    Frequency and Duration           SLP Evaluation Cognition  Overall Cognitive Status: Impaired/Different from baseline Arousal/Alertness: Awake/alert Orientation Level: Oriented X4 Memory: Impaired Memory Impairment: Other (comment) (recalled 4/5 words after 3 minute delay) Awareness: Impaired Awareness Impairment: Anticipatory impairment;Emergent impairment Problem Solving: Impaired Problem Solving Impairment: Verbal complex Executive Function: Self Correcting Self Correcting: Impaired Self Correcting Impairment: Functional basic;Functional complex Safety/Judgment: Impaired       Comprehension  Auditory Comprehension Overall Auditory Comprehension: Appears within functional limits for tasks assessed    Expression Expression Primary Mode of Expression: Verbal Verbal Expression Overall Verbal Expression: Appears within functional limits for tasks assessed   Oral / Motor  Oral Motor/Sensory Function Overall Oral Motor/Sensory Function: Within functional limits   [place], MA, [facility]

## 2020-07-24 ENCOUNTER — Encounter (HOSPITAL_COMMUNITY)
Admission: EM | Disposition: A | Discharge status: Home / Self Care | Payer: Self-pay | Source: Home / Self Care | Attending: Internal Medicine

## 2020-07-24 ENCOUNTER — Encounter (HOSPITAL_COMMUNITY): Payer: Self-pay | Admitting: Internal Medicine

## 2020-07-24 DIAGNOSIS — C679 Malignant neoplasm of bladder, unspecified: Secondary | ICD-10-CM | POA: Diagnosis not present

## 2020-07-24 DIAGNOSIS — I10 Essential (primary) hypertension: Secondary | ICD-10-CM | POA: Diagnosis not present

## 2020-07-24 DIAGNOSIS — I639 Cerebral infarction, unspecified: Secondary | ICD-10-CM | POA: Diagnosis not present

## 2020-07-24 HISTORY — PX: LOOP RECORDER INSERTION: EP1214

## 2020-07-24 LAB — GLUCOSE, CAPILLARY
Glucose-Capillary: 155 mg/dL — ABNORMAL HIGH (ref 70–99)
Glucose-Capillary: 240 mg/dL — ABNORMAL HIGH (ref 70–99)
Glucose-Capillary: 256 mg/dL — ABNORMAL HIGH (ref 70–99)
Glucose-Capillary: 104 mg/dL — ABNORMAL HIGH (ref 70–99)

## 2020-07-24 LAB — HEMOGLOBIN A1C
Hgb A1c MFr Bld: 6.9 % — ABNORMAL HIGH (ref 4.8–5.6)
Hgb A1c MFr Bld: 7 % — ABNORMAL HIGH (ref 4.8–5.6)
Mean Plasma Glucose: 151 mg/dL
Mean Plasma Glucose: 154 mg/dL

## 2020-07-24 SURGERY — LOOP RECORDER INSERTION

## 2020-07-24 MED ORDER — LIDOCAINE-EPINEPHRINE 1 %-1:100000 IJ SOLN
INTRAMUSCULAR | Status: DC | PRN
  Administered 2020-07-24: 20 mL

## 2020-07-24 MED ORDER — LIDOCAINE-EPINEPHRINE 1 %-1:100000 IJ SOLN
INTRAMUSCULAR | Status: AC
  Filled 2020-07-24: qty 1

## 2020-07-24 MED ORDER — POLYETHYLENE GLYCOL 3350 17 G PO PACK
17.0000 g | PACK | Freq: Two times a day (BID) | ORAL | Status: DC
  Administered 2020-07-24 – 2020-07-25 (×3): 17 g via ORAL
  Filled 2020-07-24 (×3): qty 1

## 2020-07-24 MED ORDER — PREDNISONE 20 MG PO TABS
20.0000 mg | ORAL_TABLET | Freq: Every day | ORAL | Status: DC
  Administered 2020-07-25: 20 mg via ORAL
  Filled 2020-07-24: qty 1

## 2020-07-24 SURGICAL SUPPLY — 2 items
MONITOR REVEAL LINQ II (Prosthesis & Implant Heart) ×1 IMPLANT
PACK LOOP INSERTION (CUSTOM PROCEDURE TRAY) ×2 IMPLANT

## 2020-07-24 NOTE — Progress Notes (Signed)
[facility] PROGRESS NOTE    Progress Note  Kenneth Kelly  MGQ:[id number] DOB: 03/25/1950 DOA: 07/21/2020 PCP: Sharilyn Sites, MD     Brief Narrative:   Kenneth Kelly is an 70 y.o. 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    Assessment/Plan:   Acute CVA (cerebrovascular accident) [facility]): Physical therapy evaluated the patient and recommended inpatient rehab. Continue aspirin, Plavix and statins. Neurology was consulted who recommended a loop recorder. We have consulted EP and awaiting further recommendations.  Essential hypertension: Resume lisinopril and amlodipine. Continue metoprolol  TOBACCO ABUSE: Counseled, placed on a nicotine patch.  Coronary artery disease : continue aspirin and statin. Resume beta-blockers.  AAA: Follow-up with PCP as an outpatient.  Bladder cancer: Follow-up with oncology as scheduled.  Diabetes mellitus type 2 with peripheral neuropathy: Continue gabapentin and metformin. Continue sliding scale insulin blood glucose fairly controlled.  Possible gout:  He was started on steroids. He relates his elbow.  Significantly better.  DVT prophylaxis: lovenox Family Communication:none Status is: Inpatient  Remains inpatient appropriate because:Hemodynamically unstable  Dispo: The patient is from: Home              Anticipated d/c is to: SNF              Patient currently is not medically stable to d/c.   Difficult to place patient No    Code Status:     Code Status Orders  (From admission, onward)            Start     Ordered   07/21/20 1551  Full code  Continuous        07/21/20 1553           Code Status History     Date Active Date Inactive Code Status Order ID Comments User Context   01/23/2016 1400 01/24/2016 1536 Full Code [id number]  [name], MD Inpatient         IV Access:   Peripheral IV   Procedures and diagnostic studies:   CT HEAD WO CONTRAST  Result Date: 07/22/2020 CLINICAL DATA:  Stroke follow-up EXAM: CT HEAD WITHOUT CONTRAST TECHNIQUE: Contiguous axial images were obtained from the base of the skull through the vertex without intravenous contrast. COMPARISON:  None. FINDINGS: Brain: Subacute infarct of the right cerebellum. There is an old posterior left parietal infarct. Mass effect on the fourth ventricle without hydrocephalus. There is periventricular hypoattenuation compatible with chronic microvascular disease. Vascular: Atherosclerotic calcification of the vertebral and internal carotid arteries at the skull base. No abnormal hyperdensity of the major intracranial arteries or dural venous sinuses. Skull: The visualized skull base, calvarium and extracranial soft tissues are normal. Sinuses/Orbits: No fluid levels or advanced mucosal thickening of the visualized paranasal sinuses. No mastoid or middle ear effusion. The orbits are normal. IMPRESSION: 1. Subacute infarct of the right cerebellum. Mass effect on the fourth ventricle without hydrocephalus. 2. Old posterior left parietal infarct. Electronically Signed   By: Ulyses Jarred M.D.   On: 07/22/2020 19:21   ECHOCARDIOGRAM COMPLETE BUBBLE STUDY  Result Date: 07/22/2020    ECHOCARDIOGRAM REPORT  Patient Name:   Kenneth Kelly Date of Exam: 07/22/2020 Medical Rec #:  [id number]      Height:       63.0 in Accession #:    [id number]     Weight:       128.0 lb Date of Birth:  03-12-1950      BSA:          1.600 m Patient Age:    54 years       BP:           164/82 mmHg Patient Gender: M              HR:            61 bpm. Exam Location:  Inpatient Procedure: 2D Echo, Cardiac Doppler and Color Doppler Indications:    Stroke  History:        Patient has no prior history of Echocardiogram examinations.                 Prior CABG; Risk Factors:Hypertension, Dyslipidemia, Diabetes                 and Current Smoker.  Sonographer:    Cammy Brochure Referring Phys: [id number] [place]  1. Left ventricular ejection fraction, by estimation, is 60 to 65%. The left ventricle has normal function. The left ventricle has no regional wall motion abnormalities. There is moderate asymmetric left ventricular hypertrophy of the basal-septal segment. Left ventricular diastolic parameters are consistent with Grade I diastolic dysfunction (impaired relaxation).  2. Right ventricular systolic function is mildly reduced. The right ventricular size is mildly enlarged. Tricuspid regurgitation signal is inadequate for assessing PA pressure.  3. Left atrial size was moderately dilated.  4. The mitral valve is abnormal. Trivial mitral valve regurgitation.  5. The aortic valve is tricuspid. Aortic valve regurgitation is not visualized. Mild aortic valve sclerosis is present, with no evidence of aortic valve stenosis. Aortic valve mean gradient measures 3.5 mmHg.  6. The inferior vena cava is normal in size with <50% respiratory variability, suggesting right atrial pressure of 8 mmHg.  7. Agitated saline contrast bubble study was negative, with no evidence of any interatrial shunt. Comparison(s): No prior Echocardiogram. FINDINGS  Left Ventricle: Left ventricular ejection fraction, by estimation, is 60 to 65%. The left ventricle has normal function. The left ventricle has no regional wall motion abnormalities. The left ventricular internal cavity size was normal in size. There is  moderate asymmetric left ventricular hypertrophy of the basal-septal segment. Left ventricular diastolic parameters are consistent with Grade I  diastolic dysfunction (impaired relaxation). Indeterminate filling pressures. Right Ventricle: The right ventricular size is mildly enlarged. No increase in right ventricular wall thickness. Right ventricular systolic function is mildly reduced. Tricuspid regurgitation signal is inadequate for assessing PA pressure. Left Atrium: Left atrial size was moderately dilated. Right Atrium: Right atrial size was normal in size. Pericardium: There is no evidence of pericardial effusion. Mitral Valve: The mitral valve is abnormal. There is mild thickening of the mitral valve leaflet(s). Trivial mitral valve regurgitation. Tricuspid Valve: The tricuspid valve is grossly normal. Tricuspid valve regurgitation is trivial. Aortic Valve: The aortic valve is tricuspid. Aortic valve regurgitation is not visualized. Mild aortic valve sclerosis is present, with no evidence of aortic valve stenosis. Aortic valve mean gradient measures 3.5 mmHg. Aortic valve peak gradient measures 7.2 mmHg. Aortic valve area, by VTI measures 2.71 cm. Pulmonic Valve: The pulmonic valve was normal in  structure. Pulmonic valve regurgitation is not visualized. Aorta: The aortic root and ascending aorta are structurally normal, with no evidence of dilitation. Venous: The inferior vena cava is normal in size with less than 50% respiratory variability, suggesting right atrial pressure of 8 mmHg. IAS/Shunts: The interatrial septum appears to be lipomatous. No atrial level shunt detected by color flow Doppler. Agitated saline contrast was given intravenously to evaluate for intracardiac shunting. Agitated saline contrast bubble study was negative,  with no evidence of any interatrial shunt. EKG: Rhythm strip during this exam demostrated sinus bradycardia.  LEFT VENTRICLE PLAX 2D LVIDd:         4.20 cm  Diastology LVIDs:         2.80 cm  LV e' medial:    3.81 cm/s LV PW:         1.00 cm  LV E/e' medial:  14.8 LV IVS:        1.50 cm  LV e' lateral:   5.55 cm/s  LVOT diam:     2.00 cm  LV E/e' lateral: 10.1 LV SV:         80 LV SV Index:   50 LVOT Area:     3.14 cm  RIGHT VENTRICLE            IVC RV Basal diam:  4.40 cm    IVC diam: 1.50 cm RV S prime:     4.84 cm/s LEFT ATRIUM             Index       RIGHT ATRIUM           Index LA diam:        3.90 cm 2.44 cm/m  RA Area:     14.30 cm LA Vol (A2C):   65.9 ml 41.20 ml/m RA Volume:   30.90 ml  19.32 ml/m LA Vol (A4C):   53.0 ml 33.13 ml/m LA Biplane Vol: 61.4 ml 38.39 ml/m  AORTIC VALVE AV Area (Vmax):    2.81 cm AV Area (Vmean):   2.62 cm AV Area (VTI):     2.71 cm AV Vmax:           134.00 cm/s AV Vmean:          85.850 cm/s AV VTI:            0.294 m AV Peak Grad:      7.2 mmHg AV Mean Grad:      3.5 mmHg LVOT Vmax:         120.00 cm/s LVOT Vmean:        71.700 cm/s LVOT VTI:          0.254 m LVOT/AV VTI ratio: 0.86  AORTA Ao Root diam: 3.40 cm Ao Asc diam:  3.70 cm MITRAL VALVE MV Area (PHT): 2.80 cm    SHUNTS MV Decel Time: 271 msec    Systemic VTI:  0.25 m MV E velocity: 56.20 cm/s  Systemic Diam: 2.00 cm MV A velocity: 44.70 cm/s MV E/A ratio:  1.26 Lyman Bishop MD Electronically signed by Lyman Bishop MD Signature Date/Time: 07/22/2020/2:48:05 PM    Final      Medical Consultants:   None.   Subjective:    Kenneth Kelly feels great ready to go to inpatient rehab. Objective:    Vitals:   07/23/20 1934 07/23/20 2335 07/24/20 0324 07/24/20 0800  BP: 134/71 (!) 109/58 100/73 137/75  Pulse: (!) 53 (!) 52 (!) 56 (!) 52  Resp: 18 19 18 17   Temp: 98 F (36.7 C) 98.1 F (36.7 C) 97.7 F (36.5 C) 97.9 F (36.6 C)  TempSrc: Oral Oral Oral Oral  SpO2: 93% 95% 96% 100%  Weight:      Height:       SpO2: 100 %   Intake/Output Summary (Last 24 hours) at 07/24/2020 0911 Last data filed at 07/23/2020 1800 Gross per 24 hour  Intake --  Output 500 ml  Net -500 ml    Filed Weights   07/21/20 1115  Weight: 58.1 kg    Exam: General exam: In no acute distress. Respiratory system:  Good air movement and clear to auscultation. Cardiovascular system: S1 & S2 heard, RRR. No JVD. Gastrointestinal system: Abdomen is nondistended, soft and nontender.  Extremities: No pedal edema. Skin: No rashes, lesions or ulcers  Data Reviewed:    Labs: Basic Metabolic Panel: Recent Labs  Lab 07/21/20 1135  NA 141  K 4.2  CL 101  CO2 26  GLUCOSE 293*  BUN 28*  CREATININE 0.94  CALCIUM 10.0    GFR Estimated Creatinine Clearance: 58 mL/min (by C-G formula based on SCr of 0.94 mg/dL). Liver Function Tests: Recent Labs  Lab 07/21/20 1135  AST 22  ALT 20  ALKPHOS 87  BILITOT 0.5  PROT 8.8*  ALBUMIN 5.0    No results for input(s): LIPASE, AMYLASE in the last 168 hours. No results for input(s): AMMONIA in the last 168 hours. Coagulation profile No results for input(s): INR, PROTIME in the last 168 hours. COVID-19 Labs  No results for input(s): DDIMER, FERRITIN, LDH, CRP in the last 72 hours.  Lab Results  Component Value Date   [place] NEGATIVE 07/21/2020    CBC: Recent Labs  Lab 07/21/20 1135  WBC 10.5  NEUTROABS 9.2*  HGB 15.4  HCT 45.2  MCV 97.2  PLT 221    Cardiac Enzymes: No results for input(s): CKTOTAL, CKMB, CKMBINDEX, TROPONINI in the last 168 hours. BNP (last 3 results) No results for input(s): PROBNP in the last 8760 hours. CBG: Recent Labs  Lab 07/23/20 0613 07/23/20 1155 07/23/20 1723 07/23/20 2120 07/24/20 0614  GLUCAP 157* 174* 187* 240* 155*    D-Dimer: No results for input(s): DDIMER in the last 72 hours. Hgb A1c: No results for input(s): HGBA1C in the last 72 hours. Lipid Profile: Recent Labs    07/22/20 1730  CHOL 165  HDL 55  LDLCALC 82  TRIG 142  CHOLHDL 3.0    Thyroid function studies: No results for input(s): TSH, T4TOTAL, T3FREE, THYROIDAB in the last 72 hours.  Invalid input(s): FREET3 Anemia work up: No results for input(s): VITAMINB12, FOLATE, FERRITIN, TIBC, IRON, RETICCTPCT in the last 72  hours. Sepsis Labs: Recent Labs  Lab 07/21/20 1135  WBC 10.5    Microbiology Recent Results (from the past 240 hour(s))  Resp Panel by RT-PCR (Flu A&B, Covid) Nasopharyngeal Swab     Status: None   Collection Time: 07/21/20  6:51 PM   Specimen: Nasopharyngeal Swab; Nasopharyngeal(NP) swabs in vial transport medium  Result Value Ref Range Status   SARS Coronavirus 2 by RT PCR NEGATIVE NEGATIVE Final    Comment: (NOTE) SARS-CoV-2 target nucleic acids are NOT DETECTED.  The SARS-CoV-2 RNA is generally detectable in upper respiratory specimens during the acute phase of infection. The lowest concentration of SARS-CoV-2 viral copies this assay can detect is 138 copies/mL. A negative result does not preclude SARS-Cov-2 infection and should not be used as the  sole basis for treatment or other patient management decisions. A negative result may occur with  improper specimen collection/handling, submission of specimen other than nasopharyngeal swab, presence of viral mutation(s) within the areas targeted by this assay, and inadequate number of viral copies(<138 copies/mL). A negative result must be combined with clinical observations, patient history, and epidemiological information. The expected result is Negative.  Fact Sheet for Patients:  EntrepreneurPulse.com.au  Fact Sheet for Healthcare Providers:  IncredibleEmployment.be  This test is no t yet approved or cleared by the [place] FDA and  has been authorized for detection and/or diagnosis of SARS-CoV-2 by FDA under an Emergency Use Authorization (EUA). This EUA will remain  in effect (meaning this test can be used) for the duration of the COVID-19 declaration under Section 564(b)(1) of the Act, 21 U.S.C.section 360bbb-3(b)(1), unless the authorization is terminated  or revoked sooner.       Influenza A by PCR NEGATIVE NEGATIVE Final   Influenza B by PCR NEGATIVE NEGATIVE Final     Comment: (NOTE) The Xpert Xpress SARS-CoV-2/FLU/RSV plus assay is intended as an aid in the diagnosis of influenza from Nasopharyngeal swab specimens and should not be used as a sole basis for treatment. Nasal washings and aspirates are unacceptable for Xpert Xpress SARS-CoV-2/FLU/RSV testing.  Fact Sheet for Patients: EntrepreneurPulse.com.au  Fact Sheet for Healthcare Providers: IncredibleEmployment.be  This test is not yet approved or cleared by the [place] FDA and has been authorized for detection and/or diagnosis of SARS-CoV-2 by FDA under an Emergency Use Authorization (EUA). This EUA will remain in effect (meaning this test can be used) for the duration of the COVID-19 declaration under Section 564(b)(1) of the Act, 21 U.S.C. section 360bbb-3(b)(1), unless the authorization is terminated or revoked.  Performed at [facility], [address], [place], [place] [address]      Medications:     stroke: mapping our early stages of recovery book   Does not apply Once   amLODipine  10 mg Oral q morning   aspirin EC  81 mg Oral Daily   clopidogrel  75 mg Oral Daily   enoxaparin (LOVENOX) injection  40 mg Subcutaneous Q24H   feeding supplement  237 mL Oral BID BM   gabapentin  900 mg Oral BID   insulin aspart  0-15 Units Subcutaneous TID WC   insulin aspart  0-5 Units Subcutaneous QHS   insulin aspart  2 Units Subcutaneous TID WC   lisinopril  10 mg Oral q morning   metFORMIN  500 mg Oral BID WC   metoprolol tartrate  50 mg Oral BID   multivitamin with minerals  1 tablet Oral Daily   predniSONE  40 mg Oral Q breakfast   simvastatin  20 mg Oral q1800   Continuous Infusions:    LOS: 3 days   Charlynne Cousins  [facility]  07/24/2020, 9:11 AM

## 2020-07-24 NOTE — TOC Transition Note (Signed)
Transition of Care [facility]) - CM/SW Discharge Note   Patient Details  Name: Kenneth Kelly MRN: [id number] Date of Birth: January 22, 1951  Transition of Care [facility]) CM/SW Contact:  Pollie Friar, RN Phone Number: 07/24/2020, 1:25 PM   Clinical Narrative:    Patient is discharging home with outpatient therapy. CM met with the patient and he prefers to attend outpatient therapy at [facility]. Orders in Epic and information on the AVS.  Pt states he has a walker at home. Insurance wont cover a tub bench. Pt states he will consider getting one from outside the hospital. Pt stastes his sister in law can provide needed transport or he can drive.  Pt denies issues with home medications.  Pt has transport home once d/ced.    Final next level of care: OP Rehab Barriers to Discharge: No Barriers Identified   Patient Goals and CMS Choice     Choice offered to / list presented to : Patient  Discharge Placement                       Discharge Plan and Services                                     Social Determinants of Health (SDOH) Interventions     Readmission Risk Interventions No flowsheet data found.

## 2020-07-24 NOTE — Progress Notes (Signed)
Occupational Therapy Treatment Patient Details Name: Kenneth Kelly MRN: [id number] DOB: [birth year] Today's Date: 07/24/2020    History of present illness 70 y.o. 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  Also seen punctuated acute right occipital infarct with subacute right frontal cortical infarct per chart.   OT comments  Pt making steady progress towards OT goals this session. Pt received supine in bed agreeable to OT intervention. Pt making progress with balance and activity tolerance with pt able to complete functional mobility greater than a household distance with and without RW, MINA for functional mobility without AD and min guard for functional mobility with RW. Pt currently requires set- up for LB ADLs and MIN A for ADL transfers. Pt with mild cognitive impairments but likley baseline level of cognition with areas of impairments noted in safety/judgement, awareness, p problem solving,  and following commands. DC plan remains appropriate, will continue to follow acutely per POC.    Follow Up Recommendations  No OT follow up    Equipment Recommendations  None recommended by OT    Recommendations for Other Services      Precautions / Restrictions Precautions Precautions: Fall Restrictions Weight Bearing Restrictions: No       Mobility Bed Mobility Overal bed mobility: Modified Independent             General bed mobility comments: no physical assist needed to transition from supine>sitting with HOB elevated    Transfers Overall transfer level: Needs assistance Equipment used: None;Rolling walker (2 wheeled) Transfers: Sit to/from  Stand Sit to Stand: Supervision         General transfer comment: supervision for safety    Balance Overall balance assessment: Needs assistance Sitting-balance support: Feet supported;No upper extremity supported Sitting balance-Leahy Scale: Good     Standing balance support: During functional activity;Bilateral upper extremity supported;No upper extremity supported Standing balance-Leahy Scale: Fair Standing balance comment: pt able to step over obstacles in hallway with no UE support with no LOB, pt able to don mask in standing with no UE support                           ADL either performed or assessed with clinical judgement   ADL Overall ADL's : Needs assistance/impaired                     Lower Body Dressing: Set up;Sitting/lateral leans Lower Body Dressing Details (indicate cue type and reason): to don shoes from EOB Toilet Transfer: Minimal assistance;Ambulation;Min guard;RW Armed forces technical officer Details (indicate cue type and reason): simulated via functional mobility, MIN A with no AD,min guard with RW         Functional mobility during ADLs: Minimal assistance;Min guard;Rolling walker General ADL Comments: pt with improvements in balance able to complete dynamic balance task of stepping over obstacles in hallway with and without AD, cognition appears to still be an issuse although likely baseline     Vision       Perception     Praxis      Cognition Arousal/Alertness: Awake/alert Behavior During Therapy: WFL for tasks assessed/performed Overall Cognitive Status: Impaired/Different from baseline Area  of Impairment: Safety/judgement;Awareness;Problem solving;Following commands                       Following Commands: Follows one step commands with increased time;Follows multi-step commands with increased time Safety/Judgement: Decreased awareness of safety;Decreased awareness of deficits Awareness: Intellectual Problem Solving:  Slow processing;Decreased initiation;Difficulty sequencing General Comments: pt with impaired ability to multi task pt able to name 8 animals in 1 min and required incresed time to follow commands, poor safety awarenes noted during mobility        Exercises     Shoulder Instructions       General Comments pt reports liking to work in his garden and take his 3 dogs on drives    Pertinent Vitals/ Pain       Pain Assessment: No/denies pain  Home Living                                          Prior Functioning/Environment              Frequency           Progress Toward Goals  OT Goals(current goals can now be found in the care plan section)  Progress towards OT goals: Progressing toward goals  Acute Rehab OT Goals Patient Stated Goal: hoping to get back home OT Goal Formulation: With patient Time For Goal Achievement: 08/05/20 Potential to Achieve Goals: Good  Plan Discharge plan remains appropriate;Frequency remains appropriate    Co-evaluation                 AM-PAC OT "6 Clicks" Daily Activity     Outcome Measure   Help from another person eating meals?: None Help from another person taking care of personal grooming?: A Little Help from another person toileting, which includes using toliet, bedpan, or urinal?: A Little Help from another person bathing (including washing, rinsing, drying)?: A Little Help from another person to put on and taking off regular upper body clothing?: None Help from another person to put on and taking off regular lower body clothing?: None 6 Click Score: 21    End of Session Equipment Utilized During Treatment: Gait belt;Rolling walker  OT Visit Diagnosis: Unsteadiness on feet (R26.81)   Activity Tolerance Patient tolerated treatment well   Patient Left in chair;with call bell/phone within reach   Nurse Communication Mobility status        Time: 3748-2707 OT Time Calculation (min): 10  min  Charges: OT General Charges $OT Visit: 1 Visit OT Treatments $Self Care/Home Management : 8-22 mins  Harley Alto., COTA/L Acute Rehabilitation Services [phone]    Precious Haws 07/24/2020, 4:23 PM

## 2020-07-24 NOTE — Consult Note (Addendum)
ELECTROPHYSIOLOGY CONSULT NOTE  Patient ID: Kenneth Kelly MRN: [id number], DOB/AGE: 1950-10-30   Admit date: 07/21/2020 Date of Consult: 07/24/2020  Primary Physician: Sharilyn Sites, MD Primary Cardiologist: Dr. Bronson Ing > last 2018 Reason for Consultation: Cryptogenic stroke -; recommendations regarding Implantable Loop Recorder, requested by Dr. Leonie Man  History of Present Illness Kenneth Kelly was admitted on 07/21/2020 with a day of unsteady gait, generalized weakness, N/V and headache >> stroke.    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 he has undergone workup for stroke including echocardiogram and carotid angio and doppler  The patient has been monitored on telemetry which has demonstrated sinus rhythm with no arrhythmias.  I  Neurology has deferred TEE.   Echocardiogram this admission demonstrated   IMPRESSIONS   1. Left ventricular ejection fraction, by estimation, is 60 to 65%. The  left ventricle has normal function. The left ventricle has no regional  wall motion abnormalities. There is moderate asymmetric left ventricular  hypertrophy of the basal-septal  segment. Left ventricular diastolic parameters are consistent with Grade I  diastolic dysfunction (impaired relaxation).   2. Right ventricular systolic function is mildly reduced. The right  ventricular size is mildly enlarged. Tricuspid regurgitation signal is  inadequate for assessing PA pressure.   3. Left atrial size was moderately dilated.   4. The mitral valve is abnormal. Trivial mitral valve regurgitation.   5. The aortic valve is tricuspid. Aortic valve regurgitation is not  visualized. Mild aortic valve sclerosis is present, with no evidence of  aortic valve stenosis. Aortic valve mean gradient measures 3.5 mmHg.   6. The inferior  vena cava is normal in size with <50% respiratory  variability, suggesting right atrial pressure of 8 mmHg.   7. Agitated saline contrast bubble study was negative, with no evidence  of any interatrial shunt.   Comparison(s): No prior Echocardiogram.   Lab work is reviewed.   Prior to admission, the patient denies chest pain, shortness of breath, dizziness, palpitations, or syncope.  They are recovering from their stroke with plans to CIR at discharge.      Past Medical History:  Diagnosis Date   Abdominal aortic aneurysm (AAA) [facility]) followed by cardiologist-- dr Bronson Ing   per last CT 01-12-2016  Infrarenal aortic aneurysm 3.4cm   Arthritis    Hip   Bladder tumor    CAD (coronary artery disease)    CARDIOLOGIST-  DR Bronson Ing---  HX CABG X3 08-15-1999   First degree AV block    Gout    History of bladder cancer urologist-  dr Jeffie Pollock   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   History of fracture of pelvis    Hyperlipidemia 02/21/2009   Qualifier: Diagnosis of  By: Via LPN, Lynn     Hypertension    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   LIMA to LCFx, SVG to RCA, RIMA to LAD   Sinus bradycardia on ECG    Type 2 diabetes mellitus [facility])      Surgical History:  Past Surgical History:  Procedure Laterality Date   CARDIAC CATHETERIZATION  07-20-1999  dr wall   significant 2v CAD, total occlusion LAD an 80% LCFx neither amenable to PCI   CARDIOVASCULAR STRESS TEST  04-23-2016  dr Bronson Ing ([place])   Low risk nuclear study w/ small, mild intensity, reversible mid  to apical inferior defect that is consistent to ischemia but no diagnotic ST segment changes to indicate ischemia/  normal LV function and wall motion ,  nuclear stress ef 57%   CORONARY ARTERY BYPASS GRAFT  08/15/1999   dr gerhardt [facility]   x3, LIMA to left circumflex, SVG to RCA, and right internal mammary artery graft to the LAD   CYSTOSCOPY WITH BIOPSY N/A 04/08/2017   Procedure: CYSTOSCOPY;  Surgeon: [name], MD;  Location: [facility];  Service: Urology;  Laterality: N/A;   CYSTOSCOPY WITH STENT PLACEMENT Right 01/23/2016   Procedure: CYSTOSCOPY WITH STENT PLACEMENT;  Surgeon: [name], MD;  Location: [facility];  Service: Urology;  Laterality: Right;   KNEE ARTHROSCOPY Right 2001  approx.   POSTERIOR LAMINECTOMY / DECOMPRESSION LUMBAR SPINE  09-01-2009   [facility]   TRANSURETHRAL RESECTION OF BLADDER TUMOR WITH MITOMYCIN-C Bilateral 01/23/2016   Procedure: CYSTOSCOPY ,TRANSURETHRAL RESECTION OF BLADDER TUMOR;  Surgeon: [name], MD;  Location: [facility];  Service: Urology;  Laterality: Bilateral;     Medications Prior to Admission  Medication Sig Dispense Refill Last Dose   acetaminophen (TYLENOL) 500 MG tablet Take 1,000-1,500 mg by mouth every 6 (six) hours as needed (Pain).   PRN   amLODipine (NORVASC) 10 MG tablet Take 10 mg by mouth every morning.    07/20/2020   gabapentin (NEURONTIN) 300 MG capsule Take 900 mg by mouth 2 (two) times daily.   07/20/2020   ibuprofen (ADVIL,MOTRIN) 200 MG tablet Take 400-600 mg by mouth every 6 (six) hours as needed (pain).   PRN   lisinopril (PRINIVIL,ZESTRIL) 10 MG tablet Take 10 mg by mouth every morning.   07/20/2020   metFORMIN (GLUCOPHAGE) 1000 MG tablet Take 500 mg by mouth 2 (two) times daily with a meal.   07/20/2020   metoprolol (LOPRESSOR) 50 MG tablet Take 50 mg by mouth 2 (two) times daily.     07/20/2020 at pm   simvastatin (ZOCOR) 20 MG tablet Take 20 mg by mouth every morning.   07/20/2020   aspirin EC 81 MG tablet Take 1 tablet (81 mg total) by mouth daily. (Patient not taking: No sig reported)      nitroGLYCERIN (NITROSTAT) 0.4 MG SL tablet Place 1 tablet (0.4 mg total) under the tongue every 5 (five) minutes as needed for chest pain. 25 tablet 3 PRN    Inpatient Medications:    stroke: mapping our early stages of recovery book   Does not apply Once   amLODipine  10 mg Oral q morning   aspirin EC  81 mg Oral Daily   clopidogrel   75 mg Oral Daily   enoxaparin (LOVENOX) injection  40 mg Subcutaneous Q24H   feeding supplement  237 mL Oral BID BM   gabapentin  900 mg Oral BID   insulin aspart  0-15 Units Subcutaneous TID WC   insulin aspart  0-5 Units Subcutaneous QHS   insulin aspart  2 Units Subcutaneous TID WC   lisinopril  10 mg Oral q morning   metFORMIN  500 mg Oral BID WC   metoprolol tartrate  50 mg Oral BID   multivitamin with minerals  1 tablet Oral Daily   predniSONE  40 mg Oral Q breakfast   simvastatin  20 mg Oral q1800    Allergies: No Known Allergies  Social History   Socioeconomic History   Marital status: Divorced    Spouse name: Not on file   Number of children:  Not on file   Years of education: Not on file   Highest education level: Not on file  Occupational History   Not on file  Tobacco Use   Smoking status: Some Days    Packs/day: 0.50    Years: 56.00    Pack years: 28.00    Types: Cigarettes   Smokeless tobacco: Never  Vaping Use   Vaping Use: Never used  Substance and Sexual Activity   Alcohol use: Yes    Comment: rare   Drug use: No   Sexual activity: Not on file  Other Topics Concern   Not on file  Social History Narrative   Not on file   Social Determinants of Health   Financial Resource Strain: Not on file  Food Insecurity: Not on file  Transportation Needs: Not on file  Physical Activity: Not on file  Stress: Not on file  Social Connections: Not on file  Intimate Partner Violence: Not on file     Family History  Problem Relation Age of Onset   Heart disease Father    Heart attack Brother    Heart disease Brother    Heart disease Brother       Review of Systems: All other systems reviewed and are otherwise negative except as noted above.  Physical Exam: Vitals:   07/23/20 1153 07/23/20 1934 07/23/20 2335 07/24/20 0324  BP: (!) 162/76 134/71 (!) 109/58 100/73  Pulse: (!) 47 (!) 53 (!) 52 (!) 56  Resp: 18 18 19 18   Temp: 97.6 F (36.4 C) 98 F  (36.7 C) 98.1 F (36.7 C) 97.7 F (36.5 C)  TempSrc: Oral Oral Oral Oral  SpO2: 95% 93% 95% 96%  Weight:      Height:        GEN- The patient is well appearing, alert and oriented x 3 today.   Head- normocephalic, atraumatic Eyes-  Sclera clear, conjunctiva pink Ears- hearing intact Oropharynx- clear Neck- supple Lungs- CTA b/l, normal work of breathing Heart- RRR, no murmurs, rubs or gallops  GI- soft, NT, ND Extremities- no clubbing, cyanosis, or edema MS- no significant deformity or atrophy Skin- no rash or lesion Psych- euthymic mood, full affect   Labs:   Lab Results  Component Value Date   WBC 10.5 07/21/2020   HGB 15.4 07/21/2020   HCT 45.2 07/21/2020   MCV 97.2 07/21/2020   PLT 221 07/21/2020    Recent Labs  Lab 07/21/20 1135  NA 141  K 4.2  CL 101  CO2 26  BUN 28*  CREATININE 0.94  CALCIUM 10.0  PROT 8.8*  BILITOT 0.5  ALKPHOS 87  ALT 20  AST 22  GLUCOSE 293*   No results found for: CKTOTAL, CKMB, CKMBINDEX, TROPONINI Lab Results  Component Value Date   CHOL 165 07/22/2020   Lab Results  Component Value Date   HDL 55 07/22/2020   Lab Results  Component Value Date   LDLCALC 82 07/22/2020   Lab Results  Component Value Date   TRIG 142 07/22/2020   Lab Results  Component Value Date   CHOLHDL 3.0 07/22/2020   No results found for: LDLDIRECT  No results found for: DDIMER   Radiology/Studies:  CT Angio Head W or Wo Contrast Result Date: 07/21/2020 CLINICAL DATA:  Vomiting with acute infarct on MRI EXAM: CT ANGIOGRAPHY HEAD AND NECK TECHNIQUE: Multidetector CT imaging of the head and neck was performed using the standard protocol during bolus administration of intravenous contrast. Multiplanar CT image  reconstructions and MIPs were obtained to evaluate the vascular anatomy. Carotid stenosis measurements (when applicable) are obtained utilizing NASCET criteria, using the distal internal carotid diameter as the denominator. CONTRAST:  19mL  OMNIPAQUE IOHEXOL 350 MG/ML SOLN COMPARISON:  None. FINDINGS: CTA NECK Aortic arch: Calcified plaque along the arch and patent great vessel origins. No high-grade proximal subclavian artery stenosis. Right carotid system: Patent. Calcified plaque along the common carotid with less than 50% stenosis. Mixed plaque the proximal internal carotid causing 70% stenosis. Left carotid system: Patent. Calcified plaque along the common carotid causing less than 50% stenosis. Mixed plaque along the proximal internal carotid causing 60% stenosis. Additional calcified plaque along the distal cervical ICA with less than 50% stenosis. Vertebral arteries: Patent. Left vertebral artery is dominant. No stenosis. Skeleton: Degenerative changes of the cervical spine, greatest at C4-C5 and C5-C6. Other neck: Unremarkable. Upper chest: Emphysema. Review of the MIP images confirms the above findings CTA HEAD Anterior circulation: Intracranial internal carotid arteries are patent with diffuse mixed but primarily calcified plaque with moderate to marked stenosis. Anterior and middle cerebral arteries are patent. There is fenestration of the proximal left M1 MCA. Posterior circulation: Intracranial vertebral arteries are patent. There is calcified plaque on the left greater than right. Moderate to marked stenosis on the left. Right PICA origin is patent with occlusion approximately 11 mm beyond the origin. Left PICA origin is patent. Superior cerebellar artery origins are patent. Right posterior communicating artery is present. Possible small left posterior communicating artery. Posterior cerebral arteries are patent with atherosclerotic irregularity. There is mild to moderate stenosis on the left. Venous sinuses: Patent as allowed by contrast bolus timing. Review of the MIP images confirms the above findings IMPRESSION: Occlusion of the right PICA approximately 11 mm beyond the origin. Patent vertebral arteries. No stenosis in the neck.  Calcified plaque intracranially on the left greater than right. Plaque along the common carotids causes less than 50% stenosis. Plaque at the right ICA origin causes 70% stenosis. Plaque at the left ICA origin causes 60% stenosis. Plaque along the intracranial internal carotids causes moderate to marked stenosis. Emphysema. Electronically Signed   By: Macy Mis M.D.   On: 07/21/2020 15:04    CT HEAD WO CONTRAST Result Date: 07/22/2020 CLINICAL DATA:  Stroke follow-up EXAM: CT HEAD WITHOUT CONTRAST TECHNIQUE: Contiguous axial images were obtained from the base of the skull through the vertex without intravenous contrast. COMPARISON:  None. FINDINGS: Brain: Subacute infarct of the right cerebellum. There is an old posterior left parietal infarct. Mass effect on the fourth ventricle without hydrocephalus. There is periventricular hypoattenuation compatible with chronic microvascular disease. Vascular: Atherosclerotic calcification of the vertebral and internal carotid arteries at the skull base. No abnormal hyperdensity of the major intracranial arteries or dural venous sinuses. Skull: The visualized skull base, calvarium and extracranial soft tissues are normal. Sinuses/Orbits: No fluid levels or advanced mucosal thickening of the visualized paranasal sinuses. No mastoid or middle ear effusion. The orbits are normal. IMPRESSION: 1. Subacute infarct of the right cerebellum. Mass effect on the fourth ventricle without hydrocephalus. 2. Old posterior left parietal infarct. Electronically Signed   By: Ulyses Jarred M.D.   On: 07/22/2020 19:21   CT Head Wo Contrast Addendum Date: 07/21/2020   ADDENDUM REPORT: 07/21/2020 13:48 ADDENDUM: Suspected posterior circulation infarct may extend slightly anterior to the fourth ventricle as well. Electronically Signed   By: Lowella Grip III M.D.   On: 07/21/2020 13:48  Result Date: 07/21/2020 CLINICAL DATA:  Episodes of severe vomiting EXAM: CT HEAD WITHOUT CONTRAST  TECHNIQUE: Contiguous axial images were obtained from the base of the skull through the vertex without intravenous contrast. COMPARISON:  None. FINDINGS: Brain: There is mild diffuse atrophy. There is apparent vasogenic edema in the medial aspect of the posteroinferior right cerebellum extending to the level of the posterior fourth ventricle on the right. Elsewhere there is decreased attenuation in the anterior rostrum of corpus callosum on the right consistent prior infarct in this area. There are white matter infarcts in the left superior centrum semiovale as well as a prior infarct in the posterosuperior left parietal lobe. Periventricular small vessel disease in the centra semiovale bilaterally also noted. No well-defined mass evident. No hemorrhage, extra-axial fluid collection, or midline shift. Vascular: No hyperdense vessel evident. There is calcification in each distal vertebral artery and carotid siphon region. Skull: The bony calvarium appears intact. Sinuses/Orbits: There is mucosal thickening in several ethmoid air cells. Other visualized paranasal sinuses are clear. Orbits appear symmetric bilaterally. Other: Mastoid air cells are clear. IMPRESSION: 1. Suspected acute infarct involving a portion of the right posteroinferior cerebellar artery distribution. There is edema with sulcal effacement in this area extending medially to the inferior aspect of the fourth ventricle on the right. An underlying mass in this area is a less likely differential consideration. Correlation with MR pre and post-contrast to further evaluate may well be warranted. 2. Prior infarct in the superior posterior left parietal lobe. Prior infarct in rostrum of corpus callosum on the right. White matter infarcts noted in the superior left centrum semiovale which appear chronic. There is periventricular small vessel disease. 3.  No hemorrhage or extra-axial fluid. 4.  Multiple foci of arterial vascular calcification noted. 5.   Mucosal thickening noted in several ethmoid air cells. Electronically Signed: By: Lowella Grip III M.D. On: 07/21/2020 12:23      MR Brain W and Wo Contrast Result Date: 07/21/2020 CLINICAL DATA:  Vomiting, abnormal CT head EXAM: MRI HEAD WITHOUT AND WITH CONTRAST TECHNIQUE: Multiplanar, multiecho pulse sequences of the brain and surrounding structures were obtained without and with intravenous contrast. CONTRAST:  77mL GADAVIST GADOBUTROL 1 MMOL/ML IV SOLN COMPARISON:  Correlation made with same day CT head FINDINGS: Brain: There is reduced diffusion in the right cerebellum involving the PICA territory. A punctate focus of reduced diffusion is also present in the right occipital lobe. Small subacute to chronic right frontal cortical infarct. Chronic left parietal larger than frontal infarcts. Small chronic infarct of the right ventral corpus callosum. Additional patchy T2 hyperintensity in the supratentorial white matter is nonspecific but may reflect mild chronic microvascular ischemic changes. There is a 4 mm dural-based enhancing lesion along the left temporal convexity likely reflecting a tiny meningioma (series 21, image 15). A focus of susceptibility hypointensity is present in the region of the left perimesencephalic cistern, midbrain, and thalamus. Unclear if parenchymal or extra-axial. May reflect chronic blood products or mineralization. No significant mass effect. There is no hydrocephalus or extra-axial fluid collection. No abnormal parenchymal enhancement. Vascular: Major vessel flow voids at the skull base are preserved. Skull and upper cervical spine: Normal marrow signal is preserved. Sinuses/Orbits: Paranasal sinuses are aerated. Orbits are unremarkable. Other: Sella is unremarkable. Patchy right mastoid fluid opacification. IMPRESSION: Large acute infarct of the right PICA territory. Punctate acute right occipital infarct. Small subacute to chronic right frontal cortical infarct. Chronic  infarcts of left frontoparietal lobes and right ventral corpus callosum. Mild chronic microvascular ischemic changes. Tiny  left temporal convexity meningioma. Electronically Signed   By: Macy Mis M.D.   On: 07/21/2020 14:49   US Carotid Bilateral (at [facility] and [facility] only) Result Date: 07/21/2020 CLINICAL DATA:  Stroke symptoms, large acute right PICA infarct by MR EXAM: BILATERAL CAROTID DUPLEX ULTRASOUND TECHNIQUE: Pearline Cables scale imaging, color Doppler and duplex ultrasound were performed of bilateral carotid and vertebral arteries in the neck. COMPARISON:  07/21/2020 FINDINGS: Criteria: Quantification of carotid stenosis is based on velocity parameters that correlate the residual internal carotid diameter with NASCET-based stenosis levels, using the diameter of the distal internal carotid lumen as the denominator for stenosis measurement. The following velocity measurements were obtained: RIGHT ICA: 435/99 cm/sec CCA: 41/28 cm/sec SYSTOLIC ICA/CCA RATIO:  6.0 ECA: 247 cm/sec LEFT ICA: 158/38 cm/sec CCA: 78/67 cm/sec SYSTOLIC ICA/CCA RATIO:  1.7 ECA: 116 cm/sec RIGHT CAROTID ARTERY: Marked heterogeneous partially calcified bifurcation atherosclerosis. Significant bifurcation and proximal ICA luminal narrowing by grayscale imaging. Proximal ICA velocity elevation measures up to 435/99 centimeters/second with diffuse turbulent flow and spectral broadening. Right ICA stenosis estimated at greater than 70% by ultrasound criteria. RIGHT VERTEBRAL ARTERY: Antegrade flow with some reversed diastolic flow LEFT CAROTID ARTERY: Moderate heterogeneous and calcified bifurcation atherosclerosis. Mild velocity elevation as above. Only minimal turbulent flow with spectral broadening. Left ICA stenosis estimated at 50-69% by ultrasound criteria. LEFT VERTEBRAL ARTERY:  Normal antegrade flow IMPRESSION: Bilateral carotid atherosclerosis, worse on the right. Right ICA stenosis estimated greater than 70% Left ICA stenosis estimated  at 50-69% Abnormal right vertebral artery waveform with some reversed diastolic flow. Normal antegrade left vertebral artery flow Electronically Signed   By: Jerilynn Mages.  Shick M.D.   On: 07/21/2020 16:53     12-lead ECG SR no EKG this admission in Epic or shadow chart All prior EKG's in EPIC reviewed with no documented atrial fibrillation  Telemetry SR, infrequent PVCs  Assessment and Plan:  1. Cryptogenic stroke The patient presents with cryptogenic stroke.  I spoke at length with the patient about monitoring for afib with either a 30 day event monitor or an implantable loop recorder.  Risks, benefits, and alteratives to implantable loop recorder were discussed with the patient today.   At this time, the patient is very clear in their decision to proceed with implantable loop recorder.   He is also agreeable to get re-established with general cardiology service, he requests [place] and will get his a new patient visit with our team in that office  Wound care was reviewed with the patient (keep incision clean and dry for 3 days).  Wound check follow up will be scheduled for the patient.  Please call with questions.   Renee Dyane Dustman, PA-C 07/24/2020  EP Attending  Patient seen and examined. Agree with the findings as above. He has an expressive dysphasia and unexplained stroke. He has not had atrial fib on tele. I have reviewed the indications, risks/benefits/goals/expectations of ILR insertion and he wishes to proceed.  Carleene Overlie Aristotle Lieb,MD

## 2020-07-24 NOTE — Progress Notes (Signed)
PT Cancellation Note  Patient Details Name: Kenneth Kelly MRN: [id number] DOB: Feb 07, 1950   Cancelled Treatment:    Reason Eval/Treat Not Completed: Patient at procedure or test/unavailable. Pt in cath lab at this time. Will follow up another date. Acute PT to continue during pt's hospital stay.  Willow Ora, PTA, [place] [phone] 07/24/20, 10:37 AM    Willow Ora 07/24/2020, 10:37 AM

## 2020-07-24 NOTE — Discharge Instructions (Signed)
Wound care instructions (heart monitor) Keep incision clean and dry for 3 days. You can remove outer dressing tomorrow. Leave steri-strips (little pieces of tape) on until seen in the office for wound check appointment. Call the office [phone]) for redness, drainage, swelling, or fever.

## 2020-07-25 ENCOUNTER — Other Ambulatory Visit (HOSPITAL_COMMUNITY): Payer: Self-pay

## 2020-07-25 ENCOUNTER — Telehealth: Payer: Self-pay | Admitting: Emergency Medicine

## 2020-07-25 DIAGNOSIS — C679 Malignant neoplasm of bladder, unspecified: Secondary | ICD-10-CM | POA: Diagnosis not present

## 2020-07-25 DIAGNOSIS — I639 Cerebral infarction, unspecified: Secondary | ICD-10-CM | POA: Diagnosis not present

## 2020-07-25 DIAGNOSIS — I10 Essential (primary) hypertension: Secondary | ICD-10-CM | POA: Diagnosis not present

## 2020-07-25 LAB — GLUCOSE, CAPILLARY: Glucose-Capillary: 147 mg/dL — ABNORMAL HIGH (ref 70–99)

## 2020-07-25 MED ORDER — PREDNISONE 20 MG PO TABS
20.0000 mg | ORAL_TABLET | Freq: Every day | ORAL | 0 refills | Status: DC
  Filled 2020-07-25: qty 2, 2d supply, fill #0

## 2020-07-25 MED ORDER — ASPIRIN 81 MG PO TBEC
81.0000 mg | DELAYED_RELEASE_TABLET | Freq: Every day | ORAL | 11 refills | Status: DC
  Filled 2020-07-25: qty 30, 30d supply, fill #0

## 2020-07-25 MED ORDER — CLOPIDOGREL BISULFATE 75 MG PO TABS
75.0000 mg | ORAL_TABLET | Freq: Every day | ORAL | 3 refills | Status: DC
  Filled 2020-07-25: qty 30, 30d supply, fill #0

## 2020-07-25 MED ORDER — PANTOPRAZOLE SODIUM 40 MG PO TBEC
40.0000 mg | DELAYED_RELEASE_TABLET | Freq: Every day | ORAL | 0 refills | Status: DC
  Filled 2020-07-25: qty 30, 30d supply, fill #0

## 2020-07-25 NOTE — Telephone Encounter (Signed)
Followed up with patient. Patient states that his loop recorder site was re-dressed yesterday. Patient denies any bleeding through the re-applied dressing and states no problems today. I gave patient the [facility] number [phone] incase he had any issues with the incision site or any other questions.

## 2020-07-25 NOTE — Discharge Summary (Addendum)
Physician Discharge Summary  Kenneth Kelly QVZ:[id number] DOB: 08-09-1950 DOA: 07/21/2020  PCP: Kenneth Sites, MD  Admit date: 07/21/2020 Discharge date: 07/25/2020  Admitted From: Home Disposition:  Home  Recommendations for Outpatient Follow-up:  Follow up with Neurology in 1-2 weeks Please obtain BMP/CBC in one week   Home Health:No  (YES/NO) quipment/Devices:None  Discharge Condition:Stable CODE STATUS:Full Diet recommendation: Heart Healthy   Brief/Interim Summary: 70 y.o. 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, gout history of pelvic fracture, diabetes mellitus type 2 comes into the ED complaining of multiple episodes of emesis generalized weakness and unsteady gait, CT of the head suspect acute infarct, MRI confirmed large acute infarct to the right PICA territory1  Discharge Diagnoses:  Principal Problem:   Acute CVA (cerebrovascular accident) ([place]) Active Problems:   Hyperlipidemia   TOBACCO ABUSE   Hypertension   CAD (coronary artery disease)   Bladder cancer ([place])   Type 2 diabetes mellitus (Kenneth Kelly)  Acute CVA: MRI of the brain was done that showed confirm a large acute infarct of the right PICA territory. This was also seen on CT of the head. He was started on aspirin and Plavix neurology was consulted recommended a loop recorder. EP was consulted to place a loop recorder and follow-up with them as an outpatient continue aspirin and Plavix. Ibuprofen was discontinued and he was advised not to take any more ibuprofen as an outpatient.  Essential hypertension: No changes made to her medication continue lisinopril, amlodipine and metoprolol.  Tobacco abuse: Counseled.  Coronary artery disease: Continue beta-blockers aspirin and Plavix no changes made.  AAA: Follow-up with PCP as an outpatient.  Bladder cancer: Follow-up with oncology as an outpatient.  Diabetes mellitus type 2  with peripheral neuropathy: No changes made to his medication continue gabapentin and Neurontin as an outpatient.  Acute gout flare: Of the elbow exquisitely tender to palpation warm to touch remained afebrile no leukocytosis he was started on steroids and improve within 48 hours he will go on steroids for 2 additional days.  Discharge Instructions  Discharge Instructions     Ambulatory referral to Physical Therapy   Complete by: As directed    Ambulatory referral to Speech Therapy   Complete by: As directed    Diet - low sodium heart healthy   Complete by: As directed    Increase activity slowly   Complete by: As directed    No wound care   Complete by: As directed       Allergies as of 07/25/2020   No Known Allergies      Medication List     STOP taking these medications    ibuprofen 200 MG tablet Commonly known as: ADVIL       TAKE these medications    acetaminophen 500 MG tablet Commonly known as: TYLENOL Take 1,000-1,500 mg by mouth every 6 (six) hours as needed (Pain).   amLODipine 10 MG tablet Commonly known as: NORVASC Take 10 mg by mouth every morning.   Aspirin Low Dose 81 MG EC tablet Generic drug: aspirin Take 1 tablet (81 mg total) by mouth daily. Swallow whole.   clopidogrel 75 MG tablet Commonly known as: PLAVIX Take 1 tablet (75 mg total) by mouth daily.   gabapentin 300 MG capsule Commonly known as: NEURONTIN Take 900 mg by mouth 2 (two) times daily.   lisinopril 10 MG tablet Commonly known as: ZESTRIL Take 10 mg by mouth every  morning.   metFORMIN 1000 MG tablet Commonly known as: GLUCOPHAGE Take 500 mg by mouth 2 (two) times daily with a meal.   metoprolol tartrate 50 MG tablet Commonly known as: LOPRESSOR Take 50 mg by mouth 2 (two) times daily.   nitroGLYCERIN 0.4 MG SL tablet Commonly known as: NITROSTAT Place 1 tablet (0.4 mg total) under the tongue every 5 (five) minutes as needed for chest pain.   pantoprazole 40 MG  tablet Commonly known as: Protonix Take 1 tablet (40 mg total) by mouth daily.   predniSONE 20 MG tablet Commonly known as: DELTASONE Take 1 tablet (20 mg total) by mouth daily with breakfast. Start taking on: July 26, 2020   simvastatin 20 MG tablet Commonly known as: ZOCOR Take 20 mg by mouth every morning.        Follow-up Information     [facility] Follow up.   Specialty: Cardiology Why: 08/04/31 @ 2:40PM (wound check visit, heart monitor) Contact information: [address] [place]        Arnoldo Lenis, MD Follow up.   Specialty: Cardiology Why: 09/12/20 @ 10:40AM (further visits can be made in [place] after this initial visit) Contact information: [place] [address] [place] Follow up.   Specialty: Rehabilitation Why: The outpatient therapy will contact you for the first appointment Contact information: [place] [id number] [place] [phone]               No Known Allergies  Consultations: Neurology Electrophysiology   Procedures/Studies: CT Angio Head W or Wo Contrast  Result Date: 07/21/2020 CLINICAL DATA:  Vomiting with acute infarct on MRI EXAM: CT ANGIOGRAPHY HEAD AND NECK TECHNIQUE: Multidetector CT imaging of the head and neck was performed using the standard protocol during bolus administration of intravenous contrast. Multiplanar CT image reconstructions and MIPs were obtained to evaluate the vascular anatomy. Carotid stenosis measurements (when applicable) are obtained utilizing NASCET criteria, using the distal internal carotid diameter as the denominator. CONTRAST:  50mL OMNIPAQUE IOHEXOL 350 MG/ML SOLN COMPARISON:  None. FINDINGS: CTA NECK Aortic arch: Calcified plaque along the arch and patent great vessel origins. No  high-grade proximal subclavian artery stenosis. Right carotid system: Patent. Calcified plaque along the common carotid with less than 50% stenosis. Mixed plaque the proximal internal carotid causing 70% stenosis. Left carotid system: Patent. Calcified plaque along the common carotid causing less than 50% stenosis. Mixed plaque along the proximal internal carotid causing 60% stenosis. Additional calcified plaque along the distal cervical ICA with less than 50% stenosis. Vertebral arteries: Patent. Left vertebral artery is dominant. No stenosis. Skeleton: Degenerative changes of the cervical spine, greatest at C4-C5 and C5-C6. Other neck: Unremarkable. Upper chest: Emphysema. Review of the MIP images confirms the above findings CTA HEAD Anterior circulation: Intracranial internal carotid arteries are patent with diffuse mixed but primarily calcified plaque with moderate to marked stenosis. Anterior and middle cerebral arteries are patent. There is fenestration of the proximal left M1 MCA. Posterior circulation: Intracranial vertebral arteries are patent. There is calcified plaque on the left greater than right. Moderate to marked stenosis on the left. Right PICA origin is patent with occlusion approximately 11 mm beyond the origin. Left PICA origin is patent. Superior cerebellar artery origins are patent. Right posterior communicating artery is present. Possible small left  posterior communicating artery. Posterior cerebral arteries are patent with atherosclerotic irregularity. There is mild to moderate stenosis on the left. Venous sinuses: Patent as allowed by contrast bolus timing. Review of the MIP images confirms the above findings IMPRESSION: Occlusion of the right PICA approximately 11 mm beyond the origin. Patent vertebral arteries. No stenosis in the neck. Calcified plaque intracranially on the left greater than right. Plaque along the common carotids causes less than 50% stenosis. Plaque at the right ICA  origin causes 70% stenosis. Plaque at the left ICA origin causes 60% stenosis. Plaque along the intracranial internal carotids causes moderate to marked stenosis. Emphysema. Electronically Signed   By: Macy Mis M.D.   On: 07/21/2020 15:04   CT HEAD WO CONTRAST  Result Date: 07/22/2020 CLINICAL DATA:  Stroke follow-up EXAM: CT HEAD WITHOUT CONTRAST TECHNIQUE: Contiguous axial images were obtained from the base of the skull through the vertex without intravenous contrast. COMPARISON:  None. FINDINGS: Brain: Subacute infarct of the right cerebellum. There is an old posterior left parietal infarct. Mass effect on the fourth ventricle without hydrocephalus. There is periventricular hypoattenuation compatible with chronic microvascular disease. Vascular: Atherosclerotic calcification of the vertebral and internal carotid arteries at the skull base. No abnormal hyperdensity of the major intracranial arteries or dural venous sinuses. Skull: The visualized skull base, calvarium and extracranial soft tissues are normal. Sinuses/Orbits: No fluid levels or advanced mucosal thickening of the visualized paranasal sinuses. No mastoid or middle ear effusion. The orbits are normal. IMPRESSION: 1. Subacute infarct of the right cerebellum. Mass effect on the fourth ventricle without hydrocephalus. 2. Old posterior left parietal infarct. Electronically Signed   By: Ulyses Jarred M.D.   On: 07/22/2020 19:21   CT Head Wo Contrast  Addendum Date: 07/21/2020   ADDENDUM REPORT: 07/21/2020 13:48 ADDENDUM: Suspected posterior circulation infarct may extend slightly anterior to the fourth ventricle as well. Electronically Signed   By: Lowella Grip III M.D.   On: 07/21/2020 13:48   Result Date: 07/21/2020 CLINICAL DATA:  Episodes of severe vomiting EXAM: CT HEAD WITHOUT CONTRAST TECHNIQUE: Contiguous axial images were obtained from the base of the skull through the vertex without intravenous contrast. COMPARISON:  None.  FINDINGS: Brain: There is mild diffuse atrophy. There is apparent vasogenic edema in the medial aspect of the posteroinferior right cerebellum extending to the level of the posterior fourth ventricle on the right. Elsewhere there is decreased attenuation in the anterior rostrum of corpus callosum on the right consistent prior infarct in this area. There are white matter infarcts in the left superior centrum semiovale as well as a prior infarct in the posterosuperior left parietal lobe. Periventricular small vessel disease in the centra semiovale bilaterally also noted. No well-defined mass evident. No hemorrhage, extra-axial fluid collection, or midline shift. Vascular: No hyperdense vessel evident. There is calcification in each distal vertebral artery and carotid siphon region. Skull: The bony calvarium appears intact. Sinuses/Orbits: There is mucosal thickening in several ethmoid air cells. Other visualized paranasal sinuses are clear. Orbits appear symmetric bilaterally. Other: Mastoid air cells are clear. IMPRESSION: 1. Suspected acute infarct involving a portion of the right posteroinferior cerebellar artery distribution. There is edema with sulcal effacement in this area extending medially to the inferior aspect of the fourth ventricle on the right. An underlying mass in this area is a less likely differential consideration. Correlation with MR pre and post-contrast to further evaluate may well be warranted. 2. Prior infarct in the superior posterior left parietal lobe. Prior  infarct in rostrum of corpus callosum on the right. White matter infarcts noted in the superior left centrum semiovale which appear chronic. There is periventricular small vessel disease. 3.  No hemorrhage or extra-axial fluid. 4.  Multiple foci of arterial vascular calcification noted. 5.  Mucosal thickening noted in several ethmoid air cells. Electronically Signed: By: Lowella Grip III M.D. On: 07/21/2020 12:23   CT Angio Neck W  and/or Wo Contrast  Result Date: 07/21/2020 CLINICAL DATA:  Vomiting with acute infarct on MRI EXAM: CT ANGIOGRAPHY HEAD AND NECK TECHNIQUE: Multidetector CT imaging of the head and neck was performed using the standard protocol during bolus administration of intravenous contrast. Multiplanar CT image reconstructions and MIPs were obtained to evaluate the vascular anatomy. Carotid stenosis measurements (when applicable) are obtained utilizing NASCET criteria, using the distal internal carotid diameter as the denominator. CONTRAST:  43mL OMNIPAQUE IOHEXOL 350 MG/ML SOLN COMPARISON:  None. FINDINGS: CTA NECK Aortic arch: Calcified plaque along the arch and patent great vessel origins. No high-grade proximal subclavian artery stenosis. Right carotid system: Patent. Calcified plaque along the common carotid with less than 50% stenosis. Mixed plaque the proximal internal carotid causing 70% stenosis. Left carotid system: Patent. Calcified plaque along the common carotid causing less than 50% stenosis. Mixed plaque along the proximal internal carotid causing 60% stenosis. Additional calcified plaque along the distal cervical ICA with less than 50% stenosis. Vertebral arteries: Patent. Left vertebral artery is dominant. No stenosis. Skeleton: Degenerative changes of the cervical spine, greatest at C4-C5 and C5-C6. Other neck: Unremarkable. Upper chest: Emphysema. Review of the MIP images confirms the above findings CTA HEAD Anterior circulation: Intracranial internal carotid arteries are patent with diffuse mixed but primarily calcified plaque with moderate to marked stenosis. Anterior and middle cerebral arteries are patent. There is fenestration of the proximal left M1 MCA. Posterior circulation: Intracranial vertebral arteries are patent. There is calcified plaque on the left greater than right. Moderate to marked stenosis on the left. Right PICA origin is patent with occlusion approximately 11 mm beyond the origin.  Left PICA origin is patent. Superior cerebellar artery origins are patent. Right posterior communicating artery is present. Possible small left posterior communicating artery. Posterior cerebral arteries are patent with atherosclerotic irregularity. There is mild to moderate stenosis on the left. Venous sinuses: Patent as allowed by contrast bolus timing. Review of the MIP images confirms the above findings IMPRESSION: Occlusion of the right PICA approximately 11 mm beyond the origin. Patent vertebral arteries. No stenosis in the neck. Calcified plaque intracranially on the left greater than right. Plaque along the common carotids causes less than 50% stenosis. Plaque at the right ICA origin causes 70% stenosis. Plaque at the left ICA origin causes 60% stenosis. Plaque along the intracranial internal carotids causes moderate to marked stenosis. Emphysema. Electronically Signed   By: Macy Mis M.D.   On: 07/21/2020 15:04   MR Brain W and Wo Contrast  Result Date: 07/21/2020 CLINICAL DATA:  Vomiting, abnormal CT head EXAM: MRI HEAD WITHOUT AND WITH CONTRAST TECHNIQUE: Multiplanar, multiecho pulse sequences of the brain and surrounding structures were obtained without and with intravenous contrast. CONTRAST:  54mL GADAVIST GADOBUTROL 1 MMOL/ML IV SOLN COMPARISON:  Correlation made with same day CT head FINDINGS: Brain: There is reduced diffusion in the right cerebellum involving the PICA territory. A punctate focus of reduced diffusion is also present in the right occipital lobe. Small subacute to chronic right frontal cortical infarct. Chronic left parietal larger than frontal infarcts.  Small chronic infarct of the right ventral corpus callosum. Additional patchy T2 hyperintensity in the supratentorial white matter is nonspecific but may reflect mild chronic microvascular ischemic changes. There is a 4 mm dural-based enhancing lesion along the left temporal convexity likely reflecting a tiny meningioma (series  21, image 15). A focus of susceptibility hypointensity is present in the region of the left perimesencephalic cistern, midbrain, and thalamus. Unclear if parenchymal or extra-axial. May reflect chronic blood products or mineralization. No significant mass effect. There is no hydrocephalus or extra-axial fluid collection. No abnormal parenchymal enhancement. Vascular: Major vessel flow voids at the skull base are preserved. Skull and upper cervical spine: Normal marrow signal is preserved. Sinuses/Orbits: Paranasal sinuses are aerated. Orbits are unremarkable. Other: Sella is unremarkable. Patchy right mastoid fluid opacification. IMPRESSION: Large acute infarct of the right PICA territory. Punctate acute right occipital infarct. Small subacute to chronic right frontal cortical infarct. Chronic infarcts of left frontoparietal lobes and right ventral corpus callosum. Mild chronic microvascular ischemic changes. Tiny left temporal convexity meningioma. Electronically Signed   By: Macy Mis M.D.   On: 07/21/2020 14:49   US Carotid Bilateral (at [facility] and [facility] only)  Result Date: 07/21/2020 CLINICAL DATA:  Stroke symptoms, large acute right PICA infarct by MR EXAM: BILATERAL CAROTID DUPLEX ULTRASOUND TECHNIQUE: Pearline Cables scale imaging, color Doppler and duplex ultrasound were performed of bilateral carotid and vertebral arteries in the neck. COMPARISON:  07/21/2020 FINDINGS: Criteria: Quantification of carotid stenosis is based on velocity parameters that correlate the residual internal carotid diameter with NASCET-based stenosis levels, using the diameter of the distal internal carotid lumen as the denominator for stenosis measurement. The following velocity measurements were obtained: RIGHT ICA: 435/99 cm/sec CCA: 03/50 cm/sec SYSTOLIC ICA/CCA RATIO:  6.0 ECA: 247 cm/sec LEFT ICA: 158/38 cm/sec CCA: 09/38 cm/sec SYSTOLIC ICA/CCA RATIO:  1.7 ECA: 116 cm/sec RIGHT CAROTID ARTERY: Marked heterogeneous partially calcified  bifurcation atherosclerosis. Significant bifurcation and proximal ICA luminal narrowing by grayscale imaging. Proximal ICA velocity elevation measures up to 435/99 centimeters/second with diffuse turbulent flow and spectral broadening. Right ICA stenosis estimated at greater than 70% by ultrasound criteria. RIGHT VERTEBRAL ARTERY: Antegrade flow with some reversed diastolic flow LEFT CAROTID ARTERY: Moderate heterogeneous and calcified bifurcation atherosclerosis. Mild velocity elevation as above. Only minimal turbulent flow with spectral broadening. Left ICA stenosis estimated at 50-69% by ultrasound criteria. LEFT VERTEBRAL ARTERY:  Normal antegrade flow IMPRESSION: Bilateral carotid atherosclerosis, worse on the right. Right ICA stenosis estimated greater than 70% Left ICA stenosis estimated at 50-69% Abnormal right vertebral artery waveform with some reversed diastolic flow. Normal antegrade left vertebral artery flow Electronically Signed   By: Jerilynn Mages.  Shick M.D.   On: 07/21/2020 16:53   EP PPM/ICD IMPLANT  Result Date: 07/24/2020 CONCLUSIONS:  1. Successful implantation of a [employer] Reveal LINQ implantable loop recorder for cryptogenic stroke  2. No early apparent complications. Cristopher Peru, MD 07/24/2020 12:46 PM   ECHOCARDIOGRAM COMPLETE BUBBLE STUDY  Result Date: 07/22/2020    ECHOCARDIOGRAM REPORT   Patient Name:   KAULIN CHAVES Date of Exam: 07/22/2020 Medical Rec #:  [id number]      Height:       63.0 in Accession #:    [id number]     Weight:       128.0 lb Date of Birth:  [birth year]      BSA:          1.600 m Patient Age:    70 years  BP:           164/82 mmHg Patient Gender: M              HR:           61 bpm. Exam Location:  Inpatient Procedure: 2D Echo, Cardiac Doppler and Color Doppler Indications:    Stroke  History:        Patient has no prior history of Echocardiogram examinations.                 Prior CABG; Risk Factors:Hypertension, Dyslipidemia, Diabetes                 and Current  Smoker.  Sonographer:    Cammy Brochure Referring Phys: [id number] [place]  1. Left ventricular ejection fraction, by estimation, is 60 to 65%. The left ventricle has normal function. The left ventricle has no regional wall motion abnormalities. There is moderate asymmetric left ventricular hypertrophy of the basal-septal segment. Left ventricular diastolic parameters are consistent with Grade I diastolic dysfunction (impaired relaxation).  2. Right ventricular systolic function is mildly reduced. The right ventricular size is mildly enlarged. Tricuspid regurgitation signal is inadequate for assessing PA pressure.  3. Left atrial size was moderately dilated.  4. The mitral valve is abnormal. Trivial mitral valve regurgitation.  5. The aortic valve is tricuspid. Aortic valve regurgitation is not visualized. Mild aortic valve sclerosis is present, with no evidence of aortic valve stenosis. Aortic valve mean gradient measures 3.5 mmHg.  6. The inferior vena cava is normal in size with <50% respiratory variability, suggesting right atrial pressure of 8 mmHg.  7. Agitated saline contrast bubble study was negative, with no evidence of any interatrial shunt. Comparison(s): No prior Echocardiogram. FINDINGS  Left Ventricle: Left ventricular ejection fraction, by estimation, is 60 to 65%. The left ventricle has normal function. The left ventricle has no regional wall motion abnormalities. The left ventricular internal cavity size was normal in size. There is  moderate asymmetric left ventricular hypertrophy of the basal-septal segment. Left ventricular diastolic parameters are consistent with Grade I diastolic dysfunction (impaired relaxation). Indeterminate filling pressures. Right Ventricle: The right ventricular size is mildly enlarged. No increase in right ventricular wall thickness. Right ventricular systolic function is mildly reduced. Tricuspid regurgitation signal is inadequate for assessing PA  pressure. Left Atrium: Left atrial size was moderately dilated. Right Atrium: Right atrial size was normal in size. Pericardium: There is no evidence of pericardial effusion. Mitral Valve: The mitral valve is abnormal. There is mild thickening of the mitral valve leaflet(s). Trivial mitral valve regurgitation. Tricuspid Valve: The tricuspid valve is grossly normal. Tricuspid valve regurgitation is trivial. Aortic Valve: The aortic valve is tricuspid. Aortic valve regurgitation is not visualized. Mild aortic valve sclerosis is present, with no evidence of aortic valve stenosis. Aortic valve mean gradient measures 3.5 mmHg. Aortic valve peak gradient measures 7.2 mmHg. Aortic valve area, by VTI measures 2.71 cm. Pulmonic Valve: The pulmonic valve was normal in structure. Pulmonic valve regurgitation is not visualized. Aorta: The aortic root and ascending aorta are structurally normal, with no evidence of dilitation. Venous: The inferior vena cava is normal in size with less than 50% respiratory variability, suggesting right atrial pressure of 8 mmHg. IAS/Shunts: The interatrial septum appears to be lipomatous. No atrial level shunt detected by color flow Doppler. Agitated saline contrast was given intravenously to evaluate for intracardiac shunting. Agitated saline contrast bubble study was negative,  with no evidence of  any interatrial shunt. EKG: Rhythm strip during this exam demostrated sinus bradycardia.  LEFT VENTRICLE PLAX 2D LVIDd:         4.20 cm  Diastology LVIDs:         2.80 cm  LV e' medial:    3.81 cm/s LV PW:         1.00 cm  LV E/e' medial:  14.8 LV IVS:        1.50 cm  LV e' lateral:   5.55 cm/s LVOT diam:     2.00 cm  LV E/e' lateral: 10.1 LV SV:         80 LV SV Index:   50 LVOT Area:     3.14 cm  RIGHT VENTRICLE            IVC RV Basal diam:  4.40 cm    IVC diam: 1.50 cm RV S prime:     4.84 cm/s LEFT ATRIUM             Index       RIGHT ATRIUM           Index LA diam:        3.90 cm 2.44 cm/m   RA Area:     14.30 cm LA Vol (A2C):   65.9 ml 41.20 ml/m RA Volume:   30.90 ml  19.32 ml/m LA Vol (A4C):   53.0 ml 33.13 ml/m LA Biplane Vol: 61.4 ml 38.39 ml/m  AORTIC VALVE AV Area (Vmax):    2.81 cm AV Area (Vmean):   2.62 cm AV Area (VTI):     2.71 cm AV Vmax:           134.00 cm/s AV Vmean:          85.850 cm/s AV VTI:            0.294 m AV Peak Grad:      7.2 mmHg AV Mean Grad:      3.5 mmHg LVOT Vmax:         120.00 cm/s LVOT Vmean:        71.700 cm/s LVOT VTI:          0.254 m LVOT/AV VTI ratio: 0.86  AORTA Ao Root diam: 3.40 cm Ao Asc diam:  3.70 cm MITRAL VALVE MV Area (PHT): 2.80 cm    SHUNTS MV Decel Time: 271 msec    Systemic VTI:  0.25 m MV E velocity: 56.20 cm/s  Systemic Diam: 2.00 cm MV A velocity: 44.70 cm/s MV E/A ratio:  1.26 Lyman Bishop MD Electronically signed by Lyman Bishop MD Signature Date/Time: 07/22/2020/2:48:05 PM    Final    (Echo, Carotid, EGD, Colonoscopy, ERCP)    Subjective: No complaints feels great  Discharge Exam: Vitals:   07/25/20 0343 07/25/20 0850  BP: 132/73 (!) 160/78  Pulse: (!) 53 (!) 53  Resp: 16 16  Temp: 97.7 F (36.5 C) (!) 97.3 F (36.3 C)  SpO2: 99% 97%   Vitals:   07/24/20 1944 07/24/20 2351 07/25/20 0343 07/25/20 0850  BP: (!) 146/74 127/79 132/73 (!) 160/78  Pulse: (!) 55 (!) 53 (!) 53 (!) 53  Resp: 16 16 16 16   Temp: 98.3 F (36.8 C) 98.2 F (36.8 C) 97.7 F (36.5 C) (!) 97.3 F (36.3 C)  TempSrc: Oral Oral  Oral  SpO2: 97% 95% 99% 97%  Weight:      Height:        General: Pt is alert, awake,  not in acute distress Cardiovascular: RRR, S1/S2 +, no rubs, no gallops Respiratory: CTA bilaterally, no wheezing, no rhonchi Abdominal: Soft, NT, ND, bowel sounds + Extremities: no edema, no cyanosis    The results of significant diagnostics from this hospitalization (including imaging, microbiology, ancillary and laboratory) are listed below for reference.     Microbiology: Recent Results (from the past 240 hour(s))   Resp Panel by RT-PCR (Flu A&B, Covid) Nasopharyngeal Swab     Status: None   Collection Time: 07/21/20  6:51 PM   Specimen: Nasopharyngeal Swab; Nasopharyngeal(NP) swabs in vial transport medium  Result Value Ref Range Status   SARS Coronavirus 2 by RT PCR NEGATIVE NEGATIVE Final    Comment: (NOTE) SARS-CoV-2 target nucleic acids are NOT DETECTED.  The SARS-CoV-2 RNA is generally detectable in upper respiratory specimens during the acute phase of infection. The lowest concentration of SARS-CoV-2 viral copies this assay can detect is 138 copies/mL. A negative result does not preclude SARS-Cov-2 infection and should not be used as the sole basis for treatment or other patient management decisions. A negative result may occur with  improper specimen collection/handling, submission of specimen other than nasopharyngeal swab, presence of viral mutation(s) within the areas targeted by this assay, and inadequate number of viral copies(<138 copies/mL). A negative result must be combined with clinical observations, patient history, and epidemiological information. The expected result is Negative.  Fact Sheet for Patients:  EntrepreneurPulse.com.au  Fact Sheet for Healthcare Providers:  IncredibleEmployment.be  This test is no t yet approved or cleared by the [place] FDA and  has been authorized for detection and/or diagnosis of SARS-CoV-2 by FDA under an Emergency Use Authorization (EUA). This EUA will remain  in effect (meaning this test can be used) for the duration of the COVID-19 declaration under Section 564(b)(1) of the Act, 21 U.S.C.section 360bbb-3(b)(1), unless the authorization is terminated  or revoked sooner.       Influenza A by PCR NEGATIVE NEGATIVE Final   Influenza B by PCR NEGATIVE NEGATIVE Final    Comment: (NOTE) The Xpert Xpress SARS-CoV-2/FLU/RSV plus assay is intended as an aid in the diagnosis of influenza from  Nasopharyngeal swab specimens and should not be used as a sole basis for treatment. Nasal washings and aspirates are unacceptable for Xpert Xpress SARS-CoV-2/FLU/RSV testing.  Fact Sheet for Patients: EntrepreneurPulse.com.au  Fact Sheet for Healthcare Providers: IncredibleEmployment.be  This test is not yet approved or cleared by the [place] FDA and has been authorized for detection and/or diagnosis of SARS-CoV-2 by FDA under an Emergency Use Authorization (EUA). This EUA will remain in effect (meaning this test can be used) for the duration of the COVID-19 declaration under Section 564(b)(1) of the Act, 21 U.S.C. section 360bbb-3(b)(1), unless the authorization is terminated or revoked.  Performed at [facility], [address] [place], [place] [address]      Labs: BNP (last 3 results) No results for input(s): BNP in the last 8760 hours. Basic Metabolic Panel: Recent Labs  Lab 07/21/20 1135  NA 141  K 4.2  CL 101  CO2 26  GLUCOSE 293*  BUN 28*  CREATININE 0.94  CALCIUM 10.0   Liver Function Tests: Recent Labs  Lab 07/21/20 1135  AST 22  ALT 20  ALKPHOS 87  BILITOT 0.5  PROT 8.8*  ALBUMIN 5.0   No results for input(s): LIPASE, AMYLASE in the last 168 hours. No results for input(s): AMMONIA in the last 168 hours. CBC: Recent Labs  Lab 07/21/20  1135  WBC 10.5  NEUTROABS 9.2*  HGB 15.4  HCT 45.2  MCV 97.2  PLT 221   Cardiac Enzymes: No results for input(s): CKTOTAL, CKMB, CKMBINDEX, TROPONINI in the last 168 hours. BNP: Invalid input(s): POCBNP CBG: Recent Labs  Lab 07/24/20 0614 07/24/20 1156 07/24/20 1621 07/24/20 2104 07/25/20 0611  GLUCAP 155* 240* 256* 104* 147*   D-Dimer No results for input(s): DDIMER in the last 72 hours. Hgb A1c Recent Labs    07/22/20 1730 07/23/20 1001  HGBA1C 6.9* 7.0*   Lipid Profile Recent Labs    07/22/20 1730  CHOL 165  HDL 55  LDLCALC 82  TRIG 142   CHOLHDL 3.0   Thyroid function studies No results for input(s): TSH, T4TOTAL, T3FREE, THYROIDAB in the last 72 hours.  Invalid input(s): FREET3 Anemia work up No results for input(s): VITAMINB12, FOLATE, FERRITIN, TIBC, IRON, RETICCTPCT in the last 72 hours. Urinalysis No results found for: COLORURINE, APPEARANCEUR, [place], [place], GLUCOSEU, [place], [place], [place], PROTEINUR, UROBILINOGEN, NITRITE, LEUKOCYTESUR Sepsis Labs Invalid input(s): PROCALCITONIN,  WBC,  LACTICIDVEN Microbiology Recent Results (from the past 240 hour(s))  Resp Panel by RT-PCR (Flu A&B, Covid) Nasopharyngeal Swab     Status: None   Collection Time: 07/21/20  6:51 PM   Specimen: Nasopharyngeal Swab; Nasopharyngeal(NP) swabs in vial transport medium  Result Value Ref Range Status   SARS Coronavirus 2 by RT PCR NEGATIVE NEGATIVE Final    Comment: (NOTE) SARS-CoV-2 target nucleic acids are NOT DETECTED.  The SARS-CoV-2 RNA is generally detectable in upper respiratory specimens during the acute phase of infection. The lowest concentration of SARS-CoV-2 viral copies this assay can detect is 138 copies/mL. A negative result does not preclude SARS-Cov-2 infection and should not be used as the sole basis for treatment or other patient management decisions. A negative result may occur with  improper specimen collection/handling, submission of specimen other than nasopharyngeal swab, presence of viral mutation(s) within the areas targeted by this assay, and inadequate number of viral copies(<138 copies/mL). A negative result must be combined with clinical observations, patient history, and epidemiological information. The expected result is Negative.  Fact Sheet for Patients:  EntrepreneurPulse.com.au  Fact Sheet for Healthcare Providers:  IncredibleEmployment.be  This test is no t yet approved or cleared by the [place] FDA and  has been authorized for  detection and/or diagnosis of SARS-CoV-2 by FDA under an Emergency Use Authorization (EUA). This EUA will remain  in effect (meaning this test can be used) for the duration of the COVID-19 declaration under Section 564(b)(1) of the Act, 21 U.S.C.section 360bbb-3(b)(1), unless the authorization is terminated  or revoked sooner.       Influenza A by PCR NEGATIVE NEGATIVE Final   Influenza B by PCR NEGATIVE NEGATIVE Final    Comment: (NOTE) The Xpert Xpress SARS-CoV-2/FLU/RSV plus assay is intended as an aid in the diagnosis of influenza from Nasopharyngeal swab specimens and should not be used as a sole basis for treatment. Nasal washings and aspirates are unacceptable for Xpert Xpress SARS-CoV-2/FLU/RSV testing.  Fact Sheet for Patients: EntrepreneurPulse.com.au  Fact Sheet for Healthcare Providers: IncredibleEmployment.be  This test is not yet approved or cleared by the [place] FDA and has been authorized for detection and/or diagnosis of SARS-CoV-2 by FDA under an Emergency Use Authorization (EUA). This EUA will remain in effect (meaning this test can be used) for the duration of the COVID-19 declaration under Section 564(b)(1) of the Act, 21 U.S.C. section 360bbb-3(b)(1), unless the authorization is terminated  or revoked.  Performed at [facility], [address], [place], [place] [address]      Time coordinating discharge: Over 30 minutes  SIGNED:   Charlynne Cousins, MD  [facility] 07/25/2020, 10:44 AM Pager   If 7PM-7AM, please contact night-coverage [email or web address] Password [email or web address]

## 2020-07-25 NOTE — Plan of Care (Signed)
  Problem: Education: Goal: Knowledge of General Education information will improve Description: Including pain rating scale, medication(s)/side effects and non-pharmacologic comfort measures 07/25/2020 0911 by Vesta Mixer, RN Outcome: Adequate for Discharge 07/25/2020 0854 by Vesta Mixer, RN Outcome: Adequate for Discharge   Problem: Health Behavior/Discharge Planning: Goal: Ability to manage health-related needs will improve 07/25/2020 0911 by Vesta Mixer, RN Outcome: Adequate for Discharge 07/25/2020 0854 by Vesta Mixer, RN Outcome: Adequate for Discharge   Problem: Clinical Measurements: Goal: Ability to maintain clinical measurements within normal limits will improve 07/25/2020 0911 by Vesta Mixer, RN Outcome: Adequate for Discharge 07/25/2020 0854 by Vesta Mixer, RN Outcome: Adequate for Discharge Goal: Will remain free from infection 07/25/2020 0911 by Vesta Mixer, RN Outcome: Adequate for Discharge 07/25/2020 0854 by Vesta Mixer, RN Outcome: Adequate for Discharge Goal: Diagnostic test results will improve 07/25/2020 0911 by Vesta Mixer, RN Outcome: Adequate for Discharge 07/25/2020 0854 by Vesta Mixer, RN Outcome: Adequate for Discharge Goal: Respiratory complications will improve 07/25/2020 0911 by Vesta Mixer, RN Outcome: Adequate for Discharge 07/25/2020 0854 by Vesta Mixer, RN Outcome: Adequate for Discharge Goal: Cardiovascular complication will be avoided 07/25/2020 0911 by Vesta Mixer, RN Outcome: Adequate for Discharge 07/25/2020 0854 by Vesta Mixer, RN Outcome: Adequate for Discharge   Problem: Activity: Goal: Risk for activity intolerance will decrease 07/25/2020 0911 by Vesta Mixer, RN Outcome: Adequate for Discharge 07/25/2020 0854 by Vesta Mixer, RN Outcome: Adequate for Discharge   Problem: Nutrition: Goal: Adequate nutrition will be maintained 07/25/2020 0911 by Vesta Mixer,  RN Outcome: Adequate for Discharge 07/25/2020 0854 by Vesta Mixer, RN Outcome: Adequate for Discharge   Problem: Coping: Goal: Level of anxiety will decrease 07/25/2020 0911 by Vesta Mixer, RN Outcome: Adequate for Discharge 07/25/2020 0854 by Vesta Mixer, RN Outcome: Adequate for Discharge   Problem: Elimination: Goal: Will not experience complications related to bowel motility 07/25/2020 0911 by Vesta Mixer, RN Outcome: Adequate for Discharge 07/25/2020 0854 by Vesta Mixer, RN Outcome: Adequate for Discharge Goal: Will not experience complications related to urinary retention 07/25/2020 0911 by Vesta Mixer, RN Outcome: Adequate for Discharge 07/25/2020 0854 by Vesta Mixer, RN Outcome: Adequate for Discharge   Problem: Pain Managment: Goal: General experience of comfort will improve 07/25/2020 0911 by Vesta Mixer, RN Outcome: Adequate for Discharge 07/25/2020 0854 by Vesta Mixer, RN Outcome: Adequate for Discharge   Problem: Safety: Goal: Ability to remain free from injury will improve 07/25/2020 0911 by Vesta Mixer, RN Outcome: Adequate for Discharge 07/25/2020 0854 by Vesta Mixer, RN Outcome: Adequate for Discharge   Problem: Skin Integrity: Goal: Risk for impaired skin integrity will decrease 07/25/2020 0911 by Vesta Mixer, RN Outcome: Adequate for Discharge 07/25/2020 0854 by Vesta Mixer, RN Outcome: Adequate for Discharge

## 2020-07-25 NOTE — Progress Notes (Signed)
During the beginning of shift outgoing RN reported pt having hematoma to his loop incision site and the loop recorder placement incision dsg was assessed to be saturated with slight hematoma noticed at site which was marked. Dsg was changed and pressure dsg applied to site. The incision continues to ooze during shift and the hematoma remained intact with no increase notice. Reported off to oncoming RN. Delia Heady RN

## 2020-07-25 NOTE — Care Management Important Message (Signed)
Important Message  Patient Details  Name: Kenneth Kelly MRN: [id number] Date of Birth: 12-04-1950   Medicare Important Message Given:  Yes     Janos Shampine [address] 07/25/2020, 11:45 AM

## 2020-07-25 NOTE — Progress Notes (Signed)
Patient being discharged home with wife.

## 2020-07-25 NOTE — Plan of Care (Signed)

## 2020-07-28 ENCOUNTER — Other Ambulatory Visit: Payer: Self-pay

## 2020-07-28 NOTE — Patient Outreach (Signed)
[facility] [facility]) Care Management  07/28/2020  Kenneth Kelly 12/09/1950 [id number]    EMMI- Stroke RED ON EMMI ALERT Day # 1 Date: 07/27/20 Red Alert Reason: Questions/problems with meds? yes   Outreach: No answer.  HIPAA compliant voice message left.   Plan: RN CM will attempt patient again within 4 business days and send letter.  [name], RN, MSN [place] [facility] Care Management Coordinator Direct Line [phone] Cell [phone] Toll Free: [phone]  Fax: [phone]

## 2020-07-31 ENCOUNTER — Other Ambulatory Visit: Payer: Self-pay

## 2020-07-31 NOTE — Patient Outreach (Signed)
[facility] Care Management  07/31/2020  YANDIEL BERGUM [birth year] [id number]   EMMI- Stroke RED ON EMMI ALERT Day # 1 Date: 07/27/20 Red Alert Reason: Questions/problems with meds? yes    Outreach: No answer.  HIPAA compliant voice message left.   Plan: RN CM will attempt patient again within 4 business days.  [name], RN, MSN [place] [facility] Care Management Coordinator Direct Line [phone] Cell [phone] Toll Free: [phone]  Fax: [phone]

## 2020-07-31 NOTE — Patient Outreach (Signed)
[facility]) Care Management  [place]  07/31/2020   Kenneth Kelly [birth year] [id number]  Subjective: Telephone call to patient for EMMI follow up. Addressed red alert. Patient reports he does not remember answering. Patient reports that he has all his medication and no problems.  Patient put phone on speaker so that his sister in law could hear as well as she does his medications for him.  She was able to reviewed medications with CM. Patient is independent with care and still drives and lives with brother and sister-in-law.    Discussed [facility] services and support. Patient agreeable to telephonic nurse follow up at this time.   Patient recently hospitalized for stroke. Patient has some weakness and uses a cane for ambulation. Patient also has history of HTN and diabetes. Patient reports that his A1c goal is 7. Reviewed last A1c and it was a 7.  Discussed diabetes management. Patient checks sugars 2-3 times per week.  Patient has blood pressure cuff but does not check regularly.  Encouraged patient to check blood pressure at least 3 times a week and record. He verbalized understanding.    Objective:   Encounter Medications:  Outpatient Encounter Medications as of 07/31/2020  Medication Sig   acetaminophen (TYLENOL) 500 MG tablet Take 1,000-1,500 mg by mouth every 6 (six) hours as needed (Pain).   amLODipine (NORVASC) 10 MG tablet Take 10 mg by mouth every morning.    aspirin 81 MG EC tablet Take 1 tablet (81 mg total) by mouth daily. Swallow whole.   clopidogrel (PLAVIX) 75 MG tablet Take 1 tablet (75 mg total) by mouth daily.   lisinopril (PRINIVIL,ZESTRIL) 10 MG tablet Take 10 mg by mouth every morning.   metFORMIN (GLUCOPHAGE) 1000 MG tablet Take 500 mg by mouth 2 (two) times daily with a meal.   metoprolol (LOPRESSOR) 50 MG tablet Take 50 mg by mouth 2 (two) times daily.     nitroGLYCERIN (NITROSTAT) 0.4 MG SL tablet Place 1 tablet (0.4 mg total) under the  tongue every 5 (five) minutes as needed for chest pain.   pantoprazole (PROTONIX) 40 MG tablet Take 1 tablet (40 mg total) by mouth daily.   simvastatin (ZOCOR) 20 MG tablet Take 20 mg by mouth every morning.   gabapentin (NEURONTIN) 300 MG capsule Take 900 mg by mouth 2 (two) times daily. (Patient not taking: Reported on 07/31/2020)   predniSONE (DELTASONE) 20 MG tablet Take 1 tablet (20 mg total) by mouth daily with breakfast. (Patient not taking: Reported on 07/31/2020)   No facility-administered encounter medications on file as of 07/31/2020.    Functional Status:  In your present state of health, do you have any difficulty performing the following activities: 07/31/2020 07/21/2020  Hearing? N N  Vision? N N  Difficulty concentrating or making decisions? N N  Walking or climbing stairs? N N  Dressing or bathing? N N  Doing errands, shopping? N N  Preparing Food and eating ? N -  Using the Toilet? N -  In the past six months, have you accidently leaked urine? N -  Do you have problems with loss of bowel control? N -  Managing your Medications? Y -  Comment sister in law helps -  Managing your Finances? N -  Housekeeping or managing your Housekeeping? N -  Some recent data might be hidden    Fall/Depression Screening: Fall Risk  07/31/2020  Falls in the past year? 0   PHQ 2/9 Scores 07/31/2020  PHQ - 2 Score 0    Assessment:   Care Plan Care Plan : Diabetes Type 2 (Adult)  Updates made by Jon Billings, RN since 07/31/2020 12:00 AM     Problem: Glycemic Management (Diabetes, Type 2)      Long-Range Goal: Glycemic Management Optimized as evidenced by A1c equal to less than 7.   Start Date: 07/31/2020  Expected End Date: 02/03/2021  Priority: High  Note:   Evidence-based guidance:  Anticipate A1C testing (point-of-care) every 3 to 6 months based on goal attainment.  Review mutually-set A1C goal or target range.  Provide medical nutrition therapy and development of  individualized eating.  Compare self-reported symptoms of hypo or hyperglycemia to blood glucose levels, diet and fluid intake, current medications, psychosocial and physiologic stressors, change in activity and barriers to care adherence.  Promote self-monitoring of blood glucose levels.  Assess and address barriers to management plan, such as food insecurity, age, developmental ability, depression, anxiety, fear of hypoglycemia or weight gain, as well as medication cost, side effects and complicated regimen.  Notes:     Task: Alleviate Barriers to Glycemic Management   Due Date: 02/03/2021  Priority: Routine  Responsible User: Jon Billings, RN  Note:   Care Management Activities:   - blood glucose monitoring encouraged - blood glucose readings reviewed - use of blood glucose monitoring log promoted     Notes: 07/31/20 Patient A1c 7.  Patient reports blood glucose runs around 190 regularly.  Diabetes Management Discussed: Medication adherence Reviewed importance of limiting carbs such as rice, potatoes, breads, and pastas. Also discussed limiting sweets and sugary drinks.  Discussed importance of portion control.  Also discussed importance of annual exams, foot exams, and eye exams.      Care Plan : Stroke (Adult)  Updates made by Jon Billings, RN since 07/31/2020 12:00 AM     Problem: Recurrence (Stroke)      Goal: Stroke Recurrence Prevented or Minimized as evidenced by no readmission related to stroke   Start Date: 07/31/2020  Expected End Date: 02/03/2021  Note:   Evidence-based guidance:  Establish baseline by comparing pre and poststroke level of function using age-appropriate criteria; consider history of transient ischemia attack.  Assess for sudden or new changes in attention, vision changes, language pattern, motor control and mobility, as well as facial drooping, unilateral weakness, confusion and fatigue that may indicate recurrence.  Promote long-term management of  risk factors, such as tobacco use, sedentary lifestyle, obesity, sleep disordered breathing, unhealthy diet and high alcohol consumption.  Prepare patient for cardiac monitoring (invasive or noninvasive) when cause of stroke is unknown; consider ankle-brachial index measurement as a means of predicting recurrence risk.  Monitor and manage comorbidity, such as atrial fibrillation, hypertension, carotid artery disease, cardiomyopathy, heart valve disease, sickle cell disease, coronary artery disease, sleep disordered breathing or depression.  Provide anticipatory guidance to women of childbearing age regarding potential discontinuation of oral contraceptives and alternative family planning method.   Assess and address adherence to pharmacologic therapy that may include antihypertensive, antiplatelet, anticoagulant, statin, fibrate and vitamin supplement; monitor and manage side effects.  Consider providing or referring to group-based support and/or education program; assess for cognitive impairment that may impact ability to follow therapeutic regimen.  Prepare patient for periodic checking of anticoagulant or antiplatelet therapy effectiveness.   Notes:     Task: Optimize Stroke Recovery   Note:   Care Management Activities:    - healthy lifestyle promoted - self or caregiver awareness of  signs/symptoms of repeat stroke promoted - stroke signs/symptoms assessed and compared to baseline    Notes: 07/31/20 Patient aware of stroke symptoms of  Sudden numbness or weakness in the face, arm, or leg, especially on one side of the body. Sudden confusion, trouble speaking, or difficulty understanding speech. Sudden trouble seeing in one or both eyes. Sudden trouble walking, dizziness, loss of balance, or lack of coordination. Sudden severe headache with no known cause. Call 9-1-1 right away if you or someone else has any of these symptoms.      Goals Addressed               This Visit's  Progress     Keep or Improve My Strength-Stroke        Barriers: Knowledge Timeframe:  Short-Term Goal Priority:  High Start Date:   07/31/20                          Expected End Date:   09/03/20                    Follow Up Date 09/03/20    - attend 90 percent of physical therapy appointments - eat healthy to increase strength - increase activity or exercise time a little every week    Why is this important?   Before the stroke you probably did not think much about being safe when you are up and about.  Now, it may be harder for you to get around.  It may also be easier for you to trip or fall.  It is common to have muscle weakness after a stroke. You may also feel like you cannot control an arm or leg.  It will be helpful to work with a physical therapist to get your strength and muscle control back.  It is good to stay as active as you can. Walking and stretching help you stay strong and flexible.  The physical therapist will develop an exercise program just for you.     Notes: 07/31/20 Patient to start outpatient therapy.  Therapy to contact patient. Patient has cane for ambulation.         Make and Keep All Appointments        Barriers: None  Timeframe:  Short-Term Goal Priority:  High Start Date:    07/31/20                         Expected End Date:    09/03/20                   Follow Up Date 09/03/20    - ask family or friend for a ride - call to cancel if needed - keep a calendar with appointment dates    Why is this important?   Part of staying healthy is seeing the doctor for follow-up care.  If you forget your appointments, there are some things you can do to stay on track.    Notes: 07/31/20 Per patient he sees physician as scheduled. Patient to make appointment with PCP. He states he misses appointment due to being in the hospital.         Monitor and Manage My Blood Sugar-Diabetes Type 2        Barriers: Health Behaviors Knowledge Timeframe:  Long-Range  Goal Priority:  High Start Date:  07/31/20  Expected End Date: 02/03/21                      Follow Up Date 09/03/20    - check blood sugar at prescribed times - take the blood sugar log to all doctor visits    Why is this important?   Checking your blood sugar at home helps to keep it from getting very high or very low.  Writing the results in a diary or log helps the doctor know how to care for you.  Your blood sugar log should have the time, date and the results.  Also, write down the amount of insulin or other medicine that you take.  Other information, like what you ate, exercise done and how you were feeling, will also be helpful.     Notes: A1c last report 7.0. Please check sugars as prescribed. Diabetes Management Discussed: Medication adherence Reviewed importance of limiting carbs such as rice, potatoes, breads, and pastas. Also discussed limiting sweets and sugary drinks.  Discussed importance of portion control.  Also discussed importance of annual exams, foot exams, and eye exams.         Patient Stated "Will watch for signs of recurrent stroke" (pt-stated)        Barriers: Knowledge Timeframe:  Short-Term Goal Priority:  High Start Date:  07/31/20                           Expected End Date:  09/03/20  Follow up: 09/03/20                     07/31/20 Watch for signs of stroke: Sudden numbness or weakness in the face, arm, or leg, especially on one side of the body. Sudden confusion, trouble speaking, or difficulty understanding speech. Sudden trouble seeing in one or both eyes. Sudden trouble walking, dizziness, loss of balance, or lack of coordination. Sudden severe headache with no known cause.  Call 9-1-1 right away if you or someone else has any of these symptoms. Time is important for survival.           Plan: RN CM will provide ongoing education and support to patient through phone calls.   RN CM will send welcome packet with  consent to patient.   RN CM will send initial barriers letter, assessment, and care plan to primary care physician.   Follow-up: Patient agrees to Care Plan and Follow-up. Follow-up in 2-3 week(s)  [name], RN, MSN [place] [facility] Care Management Coordinator Direct Line [phone] Cell [phone] Toll Free: [phone]  Fax: [phone]

## 2020-07-31 NOTE — Patient Instructions (Signed)
Goals Addressed               This Visit's Progress     Keep or Improve My Strength-Stroke        Barriers: Knowledge Timeframe:  Short-Term Goal Priority:  High Start Date:   07/31/20                          Expected End Date:   09/03/20                    Follow Up Date 09/03/20    - attend 90 percent of physical therapy appointments - eat healthy to increase strength - increase activity or exercise time a little every week    Why is this important?   Before the stroke you probably did not think much about being safe when you are up and about.  Now, it may be harder for you to get around.  It may also be easier for you to trip or fall.  It is common to have muscle weakness after a stroke. You may also feel like you cannot control an arm or leg.  It will be helpful to work with a physical therapist to get your strength and muscle control back.  It is good to stay as active as you can. Walking and stretching help you stay strong and flexible.  The physical therapist will develop an exercise program just for you.     Notes: 07/31/20 Patient to start outpatient therapy.  Therapy to contact patient. Patient has cane for ambulation.         Make and Keep All Appointments        Barriers: None  Timeframe:  Short-Term Goal Priority:  High Start Date:    07/31/20                         Expected End Date:    09/03/20                   Follow Up Date 09/03/20    - ask family or friend for a ride - call to cancel if needed - keep a calendar with appointment dates    Why is this important?   Part of staying healthy is seeing the doctor for follow-up care.  If you forget your appointments, there are some things you can do to stay on track.    Notes: 07/31/20 Per patient he sees physician as scheduled. Patient to make appointment with PCP. He states he misses appointment due to being in the hospital.         Monitor and Manage My Blood Sugar-Diabetes Type 2         Barriers: Health Behaviors Knowledge Timeframe:  Long-Range Goal Priority:  High Start Date:  07/31/20                           Expected End Date: 02/03/21                      Follow Up Date 09/03/20    - check blood sugar at prescribed times - take the blood sugar log to all doctor visits    Why is this important?   Checking your blood sugar at home helps to keep it from getting very high or very low.  Writing the results in a  diary or log helps the doctor know how to care for you.  Your blood sugar log should have the time, date and the results.  Also, write down the amount of insulin or other medicine that you take.  Other information, like what you ate, exercise done and how you were feeling, will also be helpful.     Notes: A1c last report 7.0. Please check sugars as prescribed. Diabetes Management Discussed: Medication adherence Reviewed importance of limiting carbs such as rice, potatoes, breads, and pastas. Also discussed limiting sweets and sugary drinks.  Discussed importance of portion control.  Also discussed importance of annual exams, foot exams, and eye exams.         Patient Stated "Will watch for signs of recurrent stroke" (pt-stated)        Barriers: Knowledge Timeframe:  Short-Term Goal Priority:  High Start Date:  07/31/20                           Expected End Date:  09/03/20  Follow up: 09/03/20                     07/31/20 Watch for signs of stroke: Sudden numbness or weakness in the face, arm, or leg, especially on one side of the body. Sudden confusion, trouble speaking, or difficulty understanding speech. Sudden trouble seeing in one or both eyes. Sudden trouble walking, dizziness, loss of balance, or lack of coordination. Sudden severe headache with no known cause.  Call 9-1-1 right away if you or someone else has any of these symptoms. Time is important for survival.

## 2020-08-02 ENCOUNTER — Other Ambulatory Visit: Payer: Self-pay

## 2020-08-02 NOTE — Patient Outreach (Signed)
[place] [facility]) Care Management  08/02/2020  Kenneth Kelly 10/12/1950 [id number]   EMMI- Stroke RED ON EMMI ALERT Day # 6 Date: 07/27/20 Red Alert Reason:  Smoked or been around smoke? Yes    Outreach attempt: No answer.  HIPAA compliant voice message left.   Plan: RN CM will attempt again within 4 business days.   [name], RN, MSN [facility] Care Management Care Management Coordinator Direct Line [phone] Toll Free: [phone]  Fax: [phone]

## 2020-08-03 ENCOUNTER — Other Ambulatory Visit: Payer: Self-pay

## 2020-08-03 ENCOUNTER — Ambulatory Visit: Payer: Self-pay

## 2020-08-03 ENCOUNTER — Ambulatory Visit (INDEPENDENT_AMBULATORY_CARE_PROVIDER_SITE_OTHER): Payer: Medicare HMO

## 2020-08-03 DIAGNOSIS — I639 Cerebral infarction, unspecified: Secondary | ICD-10-CM

## 2020-08-03 LAB — CUP PACEART INCLINIC DEVICE CHECK
Date Time Interrogation Session: 20220630145628
Implantable Pulse Generator Implant Date: 20220620

## 2020-08-03 NOTE — Progress Notes (Signed)
ILR wound check in clinic. Steri strips removed prior to visit. Wound well healed. Home monitor not currently transmitting nightly, patient educated on use of [employer] provided iphone, he will plug in when he gets home. No episodes. R waves measured 1.25 mV.  Questions answered.

## 2020-08-03 NOTE — Patient Outreach (Addendum)
[facility]) Care Management  08/03/2020  Kenneth Kelly [birth year] [id number]   EMMI- Stroke RED ON EMMI ALERT Day # 6 Date: 07/27/20 Red Alert Reason:  Smoked or been around smoke? Yes     Telephone call to patient to address EMMI red flag.  Patient reports he is doing good just waiting on his ride to the MD office.  Addressed red flag for smoking. Patient reports that he continues to smoke and does no want to quit right now.  Discussed with patient smoking increases his risk for another stroke and other health problems such as lung cancer, emphysema and kidney disease. He verbalized understanding.  Patient not agreeable to at least cut back at this time.    Patient Care Plan: Diabetes Type 2 (Adult)     Problem Identified: Glycemic Management (Diabetes, Type 2)      Long-Range Goal: Glycemic Management Optimized as evidenced by A1c equal to less than 7.   Start Date: 07/31/2020  Expected End Date: 02/03/2021  Priority: High  Note:   Evidence-based guidance:  Anticipate A1C testing (point-of-care) every 3 to 6 months based on goal attainment.  Review mutually-set A1C goal or target range.  Provide medical nutrition therapy and development of individualized eating.  Compare self-reported symptoms of hypo or hyperglycemia to blood glucose levels, diet and fluid intake, current medications, psychosocial and physiologic stressors, change in activity and barriers to care adherence.  Promote self-monitoring of blood glucose levels.  Assess and address barriers to management plan, such as food insecurity, age, developmental ability, depression, anxiety, fear of hypoglycemia or weight gain, as well as medication cost, side effects and complicated regimen.  Notes:     Task: Alleviate Barriers to Glycemic Management   Due Date: 02/03/2021  Priority: Routine  Responsible User: Jon Billings, RN  Note:   Care Management Activities:   - blood glucose monitoring  encouraged - blood glucose readings reviewed - use of blood glucose monitoring log promoted     Notes: 07/31/20 Patient A1c 7.  Patient reports blood glucose runs around 190 regularly.  Diabetes Management Discussed: Medication adherence Reviewed importance of limiting carbs such as rice, potatoes, breads, and pastas. Also discussed limiting sweets and sugary drinks.  Discussed importance of portion control.  Also discussed importance of annual exams, foot exams, and eye exams.      Patient Care Plan: Stroke (Adult)     Problem Identified: Recurrence (Stroke)      Goal: Stroke Recurrence Prevented or Minimized as evidenced by no readmission related to stroke   Start Date: 07/31/2020  Expected End Date: 02/03/2021  Note:   Evidence-based guidance:  Establish baseline by comparing pre and poststroke level of function using age-appropriate criteria; consider history of transient ischemia attack.  Assess for sudden or new changes in attention, vision changes, language pattern, motor control and mobility, as well as facial drooping, unilateral weakness, confusion and fatigue that may indicate recurrence.  Promote long-term management of risk factors, such as tobacco use, sedentary lifestyle, obesity, sleep disordered breathing, unhealthy diet and high alcohol consumption.  Prepare patient for cardiac monitoring (invasive or noninvasive) when cause of stroke is unknown; consider ankle-brachial index measurement as a means of predicting recurrence risk.  Monitor and manage comorbidity, such as atrial fibrillation, hypertension, carotid artery disease, cardiomyopathy, heart valve disease, sickle cell disease, coronary artery disease, sleep disordered breathing or depression.  Provide anticipatory guidance to women of childbearing age regarding potential discontinuation of oral contraceptives and alternative family  planning method.   Assess and address adherence to pharmacologic therapy that may  include antihypertensive, antiplatelet, anticoagulant, statin, fibrate and vitamin supplement; monitor and manage side effects.  Consider providing or referring to group-based support and/or education program; assess for cognitive impairment that may impact ability to follow therapeutic regimen.  Prepare patient for periodic checking of anticoagulant or antiplatelet therapy effectiveness.   Notes:     Task: Optimize Stroke Recovery   Note:   Care Management Activities:    - healthy lifestyle promoted - self or caregiver awareness of signs/symptoms of repeat stroke promoted - smoking counseling provided - stroke signs/symptoms assessed and compared to baseline    Notes: 07/31/20 Patient aware of stroke symptoms of  Sudden numbness or weakness in the face, arm, or leg, especially on one side of the body. Sudden confusion, trouble speaking, or difficulty understanding speech. Sudden trouble seeing in one or both eyes. Sudden trouble walking, dizziness, loss of balance, or lack of coordination. Sudden severe headache with no known cause. Call 9-1-1 right away if you or someone else has any of these symptoms.  08/03/20 Patient continues to smoke.  Discussed with patient smoking increases his risk for another stroke and other health problems such as lung cancer, emphysema and kidney disease. He verbalized understanding.  Patient not agreeable to at least cut back at this time.        Goals Addressed               This Visit's Progress     Patient Stated "Will watch for signs of recurrent stroke" (pt-stated)        Barriers: Knowledge Timeframe:  Short-Term Goal Priority:  High Start Date:  07/31/20                           Expected End Date:  09/03/20  Follow up: 09/03/20                     07/31/20 Watch for signs of stroke: Sudden numbness or weakness in the face, arm, or leg, especially on one side of the body. Sudden confusion, trouble speaking, or difficulty understanding  speech. Sudden trouble seeing in one or both eyes. Sudden trouble walking, dizziness, loss of balance, or lack of coordination. Sudden severe headache with no known cause.  Call 9-1-1 right away if you or someone else has any of these symptoms. Time is important for survival.    08/03/20 Patient continues to smoke.   Discussed with patient smoking increases his risk for another stroke and other health problems such as lung cancer, emphysema and kidney disease. He verbalized understanding.  Patient not agreeable to at least cut back at this time.            Plan: RN CM will outreach patient as previously scheduled in 2-3 weeks. Patient agrees to follow-up and careplan.    [name], RN, MSN [place] [facility] Care Management Coordinator Direct Line [phone] Cell [phone] Toll Free: [phone]  Fax: [phone]

## 2020-08-08 ENCOUNTER — Telehealth (HOSPITAL_COMMUNITY): Payer: Self-pay

## 2020-08-08 ENCOUNTER — Other Ambulatory Visit (HOSPITAL_COMMUNITY): Payer: Self-pay

## 2020-08-08 ENCOUNTER — Other Ambulatory Visit: Payer: Self-pay

## 2020-08-08 NOTE — Telephone Encounter (Signed)
Pharmacy Transitions of Care Follow-up Telephone Call  Date of discharge: 07/25/20  Discharge Diagnosis: CVA  How have you been since you were released from the hospital? Patient has been doing well since discharge. No questions at this time.   Medication changes made at discharge:            START taking: Aspirin Low Dose (aspirin)  clopidogrel (PLAVIX)  pantoprazole (Protonix)  predniSONE (DELTASONE)   STOP taking: ibuprofen 200 MG tablet (ADVIL)   Medication changes verified by the patient?  Yes    Medication Accessibility:  Home Pharmacy: [facility]   Was the patient provided with refills on discharged medications? yes   Have all prescriptions been transferred from [facility] to home pharmacy? yes   Is the patient able to afford medications? Patient has [employer]  Medication Review:  CLOPIDOGREL (PLAVIX) Clopidogrel 75 mg once daily.  - Educated patient on expected duration of therapy of ASA 81mg  with clopidogrel.  - Advised patient of medications to avoid (NSAIDs, ASA)  - Educated that Tylenol (acetaminophen) will be the preferred analgesic to prevent risk of bleeding  - Emphasized importance of monitoring for signs and symptoms of bleeding (abnormal bruising, prolonged bleeding, nose bleeds, bleeding from gums, discolored urine, black tarry stools)  - Advised patient to alert all providers of anticoagulation therapy prior to starting a new medication or having a procedure   Follow-up Appointments:  PCP Hospital f/u appt confirmed? End of this month with Dr. Hulen Shouts f/u appt confirmed? Already had follow up with Cardiology with Dr. Juleen China on 08/03/20 and has multiple scheduled coming up.  If their condition worsens, is the pt aware to call PCP or go to the Emergency Dept.? yes  Final Patient Assessment: Patient doing well, has follow up scheduled and refills at home pharmacy.

## 2020-08-08 NOTE — Patient Outreach (Signed)
[name]) Care Management  08/08/2020  Kenneth Kelly 1950/09/20 [id number]   EMMI- Stroke RED ON EMMI ALERT Day # 9 Date: 08/04/20 Red Alert Reason:  Feeling worse overall? Yes    Spoke with patient. He reports he is doing good.  Addressed red flag. He reports he actually feels a lot better.  He says the answer was a mistake. Advised patient to continue taking his medications and following up with his physicians.  Discussed signs of stroke. He verbalized understanding.     Goals Addressed               This Visit's Progress     Patient Stated "Will watch for signs of recurrent stroke" (pt-stated)   On track     Barriers: Knowledge Timeframe:  Short-Term Goal Priority:  High Start Date:  07/31/20                           Expected End Date:  09/03/20  Follow up: 09/03/20                     07/31/20 Watch for signs of stroke: Sudden numbness or weakness in the face, arm, or leg, especially on one side of the body. Sudden confusion, trouble speaking, or difficulty understanding speech. Sudden trouble seeing in one or both eyes. Sudden trouble walking, dizziness, loss of balance, or lack of coordination. Sudden severe headache with no known cause.  Call 9-1-1 right away if you or someone else has any of these symptoms. Time is important for survival.    08/03/20 Patient continues to smoke.   Discussed with patient smoking increases his risk for another stroke and other health problems such as lung cancer, emphysema and kidney disease. He verbalized understanding.  Patient not agreeable to at least cut back at this time. 08/08/20 Reviewed signs of stroke.            Plan: RN CM will follow up in 2-3 weeks as previously scheduled. Patient agrees to Care Plan and follow-up.  [name], RN, MSN [place] [facility] Care Management Coordinator Direct Line [phone] Cell [phone] Toll Free: [phone]  Fax: [phone]

## 2020-08-08 NOTE — Patient Instructions (Signed)
Goals Addressed               This Visit's Progress     Patient Stated "Will watch for signs of recurrent stroke" (pt-stated)   On track     Barriers: Knowledge Timeframe:  Short-Term Goal Priority:  High Start Date:  07/31/20                           Expected End Date:  09/03/20  Follow up: 09/03/20                     07/31/20 Watch for signs of stroke: Sudden numbness or weakness in the face, arm, or leg, especially on one side of the body. Sudden confusion, trouble speaking, or difficulty understanding speech. Sudden trouble seeing in one or both eyes. Sudden trouble walking, dizziness, loss of balance, or lack of coordination. Sudden severe headache with no known cause.  Call 9-1-1 right away if you or someone else has any of these symptoms. Time is important for survival.    08/03/20 Patient continues to smoke.   Discussed with patient smoking increases his risk for another stroke and other health problems such as lung cancer, emphysema and kidney disease. He verbalized understanding.  Patient not agreeable to at least cut back at this time. 08/08/20 Reviewed signs of stroke.

## 2020-08-08 NOTE — Patient Outreach (Signed)
[facility]) Care Management  08/08/2020  Kenneth Kelly [birth year] [id number]   EMMI- Stroke RED ON EMMI ALERT Day # 9 Date: 08/04/20 Red Alert Reason:  Feeling worse overall? Yes   No answer.  HIPAA compliant voice message left.  Plan: RN CM will attempt again within 4 business days.    [name], RN, MSN [place] [facility] Care Management Coordinator Direct Line [phone] Cell [phone] Toll Free: [phone]  Fax: [phone]

## 2020-08-09 ENCOUNTER — Ambulatory Visit: Payer: Self-pay

## 2020-08-09 ENCOUNTER — Other Ambulatory Visit: Payer: Self-pay

## 2020-08-09 ENCOUNTER — Ambulatory Visit: Payer: Medicare HMO

## 2020-08-09 NOTE — Patient Outreach (Signed)
[facility]) Care Management  08/09/2020  Kenneth Kelly 06/19/1950 [id number]  EMMI- Stroke RED ON EMMI ALERT Day # 13 Date: 08/08/20 Red Alert Reason:  Had follow-up appointment?   No    Outreach attempt: spoke with patient. Addressed red alert. Patient reports he has follow up appointment wotj PCP later this month.  He denies any problems with transportation.  Discussed importance of follow up appointments with physicians. He verbalized understanding.   Goals Addressed             This Visit's Progress    Make and Keep All Appointments   On track    Barriers: None  Timeframe:  Short-Term Goal Priority:  High Start Date:    07/31/20                         Expected End Date:    09/03/20                   Follow Up Date 09/03/20    - arrange a ride through an agency 1 week before appointment - ask family or friend for a ride - call to cancel if needed - keep a calendar with appointment dates    Why is this important?   Part of staying healthy is seeing the doctor for follow-up care.  If you forget your appointments, there are some things you can do to stay on track.    Notes: 07/31/20 Per patient he sees physician as scheduled. Patient to make appointment with PCP. He states he misses appointment due to being in the hospital.    08/09/20 Patient EMMI red flag fired for having not been to follow up appointment. Patient has seen cardiology and has follow up with PCP later this month. Discussed importance of follow up. No concerns.           Plan: RN CM will follow up as scheduled in 2 weeks. Patient agrees to Care Plan and follow up.   [name], RN, MSN [facility] Care Management Care Management Coordinator Direct Line [phone] Toll Free: [phone]  Fax: [phone]

## 2020-08-16 DIAGNOSIS — Z0001 Encounter for general adult medical examination with abnormal findings: Secondary | ICD-10-CM | POA: Diagnosis not present

## 2020-08-16 DIAGNOSIS — I714 Abdominal aortic aneurysm, without rupture: Secondary | ICD-10-CM | POA: Diagnosis not present

## 2020-08-16 DIAGNOSIS — I739 Peripheral vascular disease, unspecified: Secondary | ICD-10-CM | POA: Diagnosis not present

## 2020-08-16 DIAGNOSIS — I2510 Atherosclerotic heart disease of native coronary artery without angina pectoris: Secondary | ICD-10-CM | POA: Diagnosis not present

## 2020-08-16 DIAGNOSIS — Z6822 Body mass index (BMI) 22.0-22.9, adult: Secondary | ICD-10-CM | POA: Diagnosis not present

## 2020-08-16 DIAGNOSIS — I639 Cerebral infarction, unspecified: Secondary | ICD-10-CM | POA: Diagnosis not present

## 2020-08-16 DIAGNOSIS — E782 Mixed hyperlipidemia: Secondary | ICD-10-CM | POA: Diagnosis not present

## 2020-08-16 DIAGNOSIS — I25708 Atherosclerosis of coronary artery bypass graft(s), unspecified, with other forms of angina pectoris: Secondary | ICD-10-CM | POA: Diagnosis not present

## 2020-08-16 DIAGNOSIS — F17290 Nicotine dependence, other tobacco product, uncomplicated: Secondary | ICD-10-CM | POA: Diagnosis not present

## 2020-08-21 ENCOUNTER — Other Ambulatory Visit: Payer: Self-pay

## 2020-08-21 NOTE — Patient Instructions (Signed)
Goals Addressed               This Visit's Progress     Keep or Improve My Strength-Stroke   On track     Barriers: Knowledge Timeframe:  Short-Term Goal Priority:  High Start Date:   07/31/20                          Expected End Date:   10/04/20                    Follow Up Date 10/04/20    - attend 90 percent of physical therapy appointments - eat healthy to increase strength - increase activity or exercise time a little every week    Why is this important?   Before the stroke you probably did not think much about being safe when you are up and about.  Now, it may be harder for you to get around.  It may also be easier for you to trip or fall.  It is common to have muscle weakness after a stroke. You may also feel like you cannot control an arm or leg.  It will be helpful to work with a physical therapist to get your strength and muscle control back.  It is good to stay as active as you can. Walking and stretching help you stay strong and flexible.  The physical therapist will develop an exercise program just for you.     Notes: 07/31/20 Patient to start outpatient therapy.  Therapy to contact patient. Patient has cane for ambulation.   08/21/20 Patient has appointment with therapy this week. Encouraged patient to exercise and remain as active as possible. He verbalized understanding.        COMPLETED: Make and Keep All Appointments   On track     Barriers: None  Timeframe:  Short-Term Goal Priority:  High Start Date:    07/31/20                         Expected End Date:    09/03/20                   Follow Up Date 09/03/20    - arrange a ride through an agency 1 week before appointment - ask family or friend for a ride - call to cancel if needed - keep a calendar with appointment dates    Why is this important?   Part of staying healthy is seeing the doctor for follow-up care.  If you forget your appointments, there are some things you can do to stay on track.     Notes: 07/31/20 Per patient he sees physician as scheduled. Patient to make appointment with PCP. He states he misses appointment due to being in the hospital.    08/09/20 Patient EMMI red flag fired for having not been to follow up appointment. Patient has seen cardiology and has follow up with PCP later this month. Discussed importance of follow up. No concerns.  08/21/20 Patient having no problems with making and keeping appointments.       Monitor and Manage My Blood Sugar-Diabetes Type 2   On track     Barriers: Health Behaviors Knowledge Timeframe:  Long-Range Goal Priority:  High Start Date:  07/31/20  Expected End Date: 02/03/21                      Follow Up Date 10/04/20    - check blood sugar at prescribed times - take the blood sugar log to all doctor visits    Why is this important?   Checking your blood sugar at home helps to keep it from getting very high or very low.  Writing the results in a diary or log helps the doctor know how to care for you.  Your blood sugar log should have the time, date and the results.  Also, write down the amount of insulin or other medicine that you take.  Other information, like what you ate, exercise done and how you were feeling, will also be helpful.     Notes: A1c last report 7.0. Please check sugars as prescribed. Diabetes Management Discussed: Medication adherence Reviewed importance of limiting carbs such as rice, potatoes, breads, and pastas. Also discussed limiting sweets and sugary drinks.  Discussed importance of portion control.  Also discussed importance of annual exams, foot exams, and eye exams.    08/21/20 Patient reports sugars have been running good.  Last reported sugar 138.  Reiterated importance of diabetes management.  He verbalized understanding. Keep up the great work!!      COMPLETED: Patient Stated "Will watch for signs of recurrent stroke" (pt-stated)        Barriers: Knowledge Timeframe:   Short-Term Goal Priority:  High Start Date:  07/31/20                           Expected End Date:  09/03/20  Follow up: 09/03/20                     07/31/20 Watch for signs of stroke: Sudden numbness or weakness in the face, arm, or leg, especially on one side of the body. Sudden confusion, trouble speaking, or difficulty understanding speech. Sudden trouble seeing in one or both eyes. Sudden trouble walking, dizziness, loss of balance, or lack of coordination. Sudden severe headache with no known cause.  Call 9-1-1 right away if you or someone else has any of these symptoms. Time is important for survival.    08/03/20 Patient continues to smoke.   Discussed with patient smoking increases his risk for another stroke and other health problems such as lung cancer, emphysema and kidney disease. He verbalized understanding.  Patient not agreeable to at least cut back at this time. 08/08/20 Reviewed signs of stroke.    08/21/20 Patient aware of signs of stroke and knows to call 911.

## 2020-08-21 NOTE — Patient Outreach (Signed)
[facility]) Care Management  [place]  08/21/2020   Kenneth Kelly 10-10-1950 [id number]  Subjective: Telephone call to patient for follow up. He reports he is doing good.  Patient to start outpatient therapy this week.  Patient has no problems with transportation.  Patient able to describe signs of stroke such as trouble speaking, walking and thinking. Patient to knows to seek medical attention immediately.  No concerns.   Objective:   Encounter Medications:  Outpatient Encounter Medications as of 08/21/2020  Medication Sig   acetaminophen (TYLENOL) 500 MG tablet Take 1,000-1,500 mg by mouth every 6 (six) hours as needed (Pain).   amLODipine (NORVASC) 10 MG tablet Take 10 mg by mouth every morning.    aspirin 81 MG EC tablet Take 1 tablet (81 mg total) by mouth daily. Swallow whole.   clopidogrel (PLAVIX) 75 MG tablet Take 1 tablet (75 mg total) by mouth daily.   gabapentin (NEURONTIN) 300 MG capsule Take 900 mg by mouth 2 (two) times daily. (Patient not taking: No sig reported)   lisinopril (PRINIVIL,ZESTRIL) 10 MG tablet Take 10 mg by mouth every morning.   metFORMIN (GLUCOPHAGE) 1000 MG tablet Take 500 mg by mouth 2 (two) times daily with a meal.   metoprolol (LOPRESSOR) 50 MG tablet Take 50 mg by mouth 2 (two) times daily.     nitroGLYCERIN (NITROSTAT) 0.4 MG SL tablet Place 1 tablet (0.4 mg total) under the tongue every 5 (five) minutes as needed for chest pain.   pantoprazole (PROTONIX) 40 MG tablet Take 1 tablet (40 mg total) by mouth daily.   predniSONE (DELTASONE) 20 MG tablet Take 1 tablet (20 mg total) by mouth daily with breakfast. (Patient not taking: No sig reported)   simvastatin (ZOCOR) 20 MG tablet Take 20 mg by mouth every morning.   No facility-administered encounter medications on file as of 08/21/2020.    Functional Status:  In your present state of health, do you have any difficulty performing the following activities: 07/31/2020  07/21/2020  Hearing? N N  Vision? N N  Difficulty concentrating or making decisions? N N  Walking or climbing stairs? N N  Dressing or bathing? N N  Doing errands, shopping? N N  Preparing Food and eating ? N -  Using the Toilet? N -  In the past six months, have you accidently leaked urine? N -  Do you have problems with loss of bowel control? N -  Managing your Medications? Y -  Comment sister in law helps -  Managing your Finances? N -  Housekeeping or managing your Housekeeping? N -  Some recent data might be hidden    Fall/Depression Screening: Fall Risk  07/31/2020  Falls in the past year? 0   PHQ 2/9 Scores 07/31/2020  PHQ - 2 Score 0    Assessment:   Care Plan Care Plan : Diabetes Type 2 (Adult)  Updates made by Jon Billings, RN since 08/21/2020 12:00 AM     Problem: Glycemic Management (Diabetes, Type 2)      Long-Range Goal: Glycemic Management Optimized as evidenced by A1c equal to less than 7.   Start Date: 07/31/2020  Expected End Date: 02/03/2021  This Visit's Progress: On track  Priority: High  Note:   Evidence-based guidance:   Compare self-reported symptoms of hypo or hyperglycemia to blood glucose levels, diet and fluid intake, current medications, psychosocial and physiologic stressors, change in activity and barriers to care adherence.  Promote self-monitoring of blood  glucose levels.  Assess and address barriers to management plan, such as food insecurity, age, developmental ability, depression, anxiety, fear of hypoglycemia or weight gain, as well as medication cost, side effects and complicated regimen.  Notes:     Task: Alleviate Barriers to Glycemic Management   Due Date: 02/03/2021  Priority: Routine  Responsible User: Jon Billings, RN  Note:   Care Management Activities:   - blood glucose readings reviewed     Notes: 07/31/20 Patient A1c 7.  Patient reports blood glucose runs around 190 regularly.  Diabetes Management  Discussed: Medication adherence Reviewed importance of limiting carbs such as rice, potatoes, breads, and pastas. Also discussed limiting sweets and sugary drinks.  Discussed importance of portion control.  Also discussed importance of annual exams, foot exams, and eye exams.    08/21/20 Patient reports sugars have been running good.  Last reported sugar 138.  Reiterated importance of diabetes management.  He verbalized understanding.     Care Plan : Stroke (Adult)  Updates made by Jon Billings, RN since 08/21/2020 12:00 AM     Problem: Recurrence (Stroke)      Goal: Stroke Recurrence Prevented or Minimized as evidenced by no readmission related to stroke   Start Date: 07/31/2020  Expected End Date: 02/03/2021  This Visit's Progress: On track  Note:   Evidence-based guidance:  Assess for sudden or new changes in attention, vision changes, language pattern, motor control and mobility, as well as facial drooping, unilateral weakness, confusion and fatigue that may indicate recurrence.  Promote long-term management of risk factors, such as tobacco use, sedentary lifestyle, obesity, sleep disordered breathing, unhealthy diet and high alcohol consumption.  Prepare patient for cardiac monitoring (invasive or noninvasive) when cause of stroke is unknown; consider ankle-brachial index measurement as a means of predicting recurrence risk.  Monitor and manage comorbidity, such as atrial fibrillation, hypertension, carotid artery disease, cardiomyopathy, heart valve disease, sickle cell disease, coronary artery disease, sleep disordered breathing or depression.  Provide anticipatory guidance to women of childbearing age regarding potential discontinuation of oral contraceptives and alternative family planning method.   Assess and address adherence to pharmacologic therapy that may include antihypertensive, antiplatelet, anticoagulant, statin, fibrate and vitamin supplement; monitor and manage side  effects.   Notes:     Task: Optimize Stroke Recovery   Due Date: 02/03/2021  Priority: Routine  Responsible User: Jon Billings, RN  Note:   Care Management Activities:    - healthy lifestyle promoted - self or caregiver awareness of signs/symptoms of repeat stroke promoted - smoking counseling provided - stroke signs/symptoms assessed and compared to baseline    Notes: 07/31/20 Patient aware of stroke symptoms of  Sudden numbness or weakness in the face, arm, or leg, especially on one side of the body. Sudden confusion, trouble speaking, or difficulty understanding speech. Sudden trouble seeing in one or both eyes. Sudden trouble walking, dizziness, loss of balance, or lack of coordination. Sudden severe headache with no known cause. Call 9-1-1 right away if you or someone else has any of these symptoms.  08/03/20 Patient continues to smoke.  Discussed with patient smoking increases his risk for another stroke and other health problems such as lung cancer, emphysema and kidney disease. He verbalized understanding.  Patient not agreeable to at least cut back at this time.    08/21/20 Patient continues to smoke.  Reiterated that smoking increases risk for other health problems.  Patient monitoring his blood pressure regularly.  Most recent blood pressure 140/75.  Patient taking medications as prescribed.  Patient able to report signs of stroke such as problems walking, talking, thinking, and possible vision changes.  Patient knows to call 911 for changes related to stroke symptoms.  Patient starting  outpatient therapy this week.       Goals Addressed               This Visit's Progress     Keep or Improve My Strength-Stroke   On track     Barriers: Knowledge Timeframe:  Short-Term Goal Priority:  High Start Date:   07/31/20                          Expected End Date:   10/04/20                    Follow Up Date 10/04/20    - attend 90 percent of physical therapy  appointments - eat healthy to increase strength - increase activity or exercise time a little every week    Why is this important?   Before the stroke you probably did not think much about being safe when you are up and about.  Now, it may be harder for you to get around.  It may also be easier for you to trip or fall.  It is common to have muscle weakness after a stroke. You may also feel like you cannot control an arm or leg.  It will be helpful to work with a physical therapist to get your strength and muscle control back.  It is good to stay as active as you can. Walking and stretching help you stay strong and flexible.  The physical therapist will develop an exercise program just for you.     Notes: 07/31/20 Patient to start outpatient therapy.  Therapy to contact patient. Patient has cane for ambulation.   08/21/20 Patient has appointment with therapy this week. Encouraged patient to exercise and remain as active as possible. He verbalized understanding.        COMPLETED: Make and Keep All Appointments   On track     Barriers: None  Timeframe:  Short-Term Goal Priority:  High Start Date:    07/31/20                         Expected End Date:    09/03/20                   Follow Up Date 09/03/20    - arrange a ride through an agency 1 week before appointment - ask family or friend for a ride - call to cancel if needed - keep a calendar with appointment dates    Why is this important?   Part of staying healthy is seeing the doctor for follow-up care.  If you forget your appointments, there are some things you can do to stay on track.    Notes: 07/31/20 Per patient he sees physician as scheduled. Patient to make appointment with PCP. He states he misses appointment due to being in the hospital.    08/09/20 Patient EMMI red flag fired for having not been to follow up appointment. Patient has seen cardiology and has follow up with PCP later this month. Discussed importance of follow  up. No concerns.  08/21/20 Patient having no problems with making and keeping appointments.       Monitor and Manage My Blood  Sugar-Diabetes Type 2   On track     Barriers: Health Behaviors Knowledge Timeframe:  Long-Range Goal Priority:  High Start Date:  07/31/20                           Expected End Date: 02/03/21                      Follow Up Date 10/04/20    - check blood sugar at prescribed times - take the blood sugar log to all doctor visits    Why is this important?   Checking your blood sugar at home helps to keep it from getting very high or very low.  Writing the results in a diary or log helps the doctor know how to care for you.  Your blood sugar log should have the time, date and the results.  Also, write down the amount of insulin or other medicine that you take.  Other information, like what you ate, exercise done and how you were feeling, will also be helpful.     Notes: A1c last report 7.0. Please check sugars as prescribed. Diabetes Management Discussed: Medication adherence Reviewed importance of limiting carbs such as rice, potatoes, breads, and pastas. Also discussed limiting sweets and sugary drinks.  Discussed importance of portion control.  Also discussed importance of annual exams, foot exams, and eye exams.    08/21/20 Patient reports sugars have been running good.  Last reported sugar 138.  Reiterated importance of diabetes management.  He verbalized understanding. Keep up the great work!!      COMPLETED: Patient Stated "Will watch for signs of recurrent stroke" (pt-stated)        Barriers: Knowledge Timeframe:  Short-Term Goal Priority:  High Start Date:  07/31/20                           Expected End Date:  09/03/20  Follow up: 09/03/20                     07/31/20 Watch for signs of stroke: Sudden numbness or weakness in the face, arm, or leg, especially on one side of the body. Sudden confusion, trouble speaking, or difficulty understanding  speech. Sudden trouble seeing in one or both eyes. Sudden trouble walking, dizziness, loss of balance, or lack of coordination. Sudden severe headache with no known cause.  Call 9-1-1 right away if you or someone else has any of these symptoms. Time is important for survival.    08/03/20 Patient continues to smoke.   Discussed with patient smoking increases his risk for another stroke and other health problems such as lung cancer, emphysema and kidney disease. He verbalized understanding.  Patient not agreeable to at least cut back at this time. 08/08/20 Reviewed signs of stroke.    08/21/20 Patient aware of signs of stroke and knows to call 911.           Plan:  Follow-up: Patient agrees to Care Plan and Follow-up. Follow-up in 2-3 week(s)  [name], RN, MSN [place] Management Care Management Coordinator Direct Line [phone] Cell [phone] Toll Free: [phone]  Fax: [phone]

## 2020-08-23 ENCOUNTER — Encounter (HOSPITAL_COMMUNITY): Payer: Self-pay | Admitting: Physical Therapy

## 2020-08-23 ENCOUNTER — Other Ambulatory Visit: Payer: Self-pay

## 2020-08-23 ENCOUNTER — Ambulatory Visit (HOSPITAL_COMMUNITY): Payer: Medicare HMO | Admitting: Physical Therapy

## 2020-08-23 ENCOUNTER — Ambulatory Visit (HOSPITAL_COMMUNITY): Payer: Medicare HMO | Attending: Internal Medicine | Admitting: Speech Pathology

## 2020-08-23 DIAGNOSIS — I639 Cerebral infarction, unspecified: Secondary | ICD-10-CM | POA: Diagnosis not present

## 2020-08-23 DIAGNOSIS — M6281 Muscle weakness (generalized): Secondary | ICD-10-CM | POA: Insufficient documentation

## 2020-08-23 NOTE — Therapy (Signed)
[facility] [place], [place], [address] Phone: [phone]   Fax:  [phone]  Physical Therapy Evaluation  Patient Details  Name: Kenneth Kelly MRN: [id number] Date of Birth: August 05, 1950 Referring Provider (PT): Charlynne Cousins MD   Encounter Date: 08/23/2020   PT End of Session - 08/23/20 1557     Visit Number 1    Number of Visits 1    Authorization Type [employer]    Authorization Time Period 1 x eval only    PT Start Time 1525    PT Stop Time 1600    PT Time Calculation (min) 35 min    Activity Tolerance Patient tolerated treatment well    Behavior During Therapy [facility] for tasks assessed/performed             Past Medical History:  Diagnosis Date   Abdominal aortic aneurysm (AAA) ([place]) followed by cardiologist-- dr Bronson Ing   per last CT 01-12-2016  Infrarenal aortic aneurysm 3.4cm   Arthritis    Hip   Bladder tumor    CAD (coronary artery disease)    CARDIOLOGIST-  DR Bronson Ing---  HX CABG X3 08-15-1999   First degree AV block    Gout    History of bladder cancer urologist-  dr Jeffie Pollock   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   History of fracture of pelvis    Hyperlipidemia 02/21/2009   Qualifier: Diagnosis of  By: Via LPN, Jeani Hawking     Hypertension    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   LIMA to LCFx, SVG to RCA, RIMA to LAD   Sinus bradycardia on ECG    Type 2 diabetes mellitus ([place])     Past Surgical History:  Procedure Laterality Date   CARDIAC CATHETERIZATION  07-20-1999  dr wall   significant 2v CAD, total occlusion LAD an 80% LCFx neither amenable to PCI   CARDIOVASCULAR STRESS TEST  04-23-2016  dr Bronson Ing ([place])   Low risk nuclear study w/ small, mild intensity, reversible mid to apical inferior defect that is consistent to ischemia but no diagnotic ST segment changes to indicate ischemia/  normal LV function and wall motion ,  nuclear stress ef 57%   CORONARY ARTERY  BYPASS GRAFT  08/15/1999   dr gerhardt [facility]   x3, LIMA to left circumflex, SVG to RCA, and right internal mammary artery graft to the LAD   CYSTOSCOPY WITH BIOPSY N/A 04/08/2017   Procedure: CYSTOSCOPY;  Surgeon: [name], MD;  Location: [facility];  Service: Urology;  Laterality: N/A;   CYSTOSCOPY WITH STENT PLACEMENT Right 01/23/2016   Procedure: CYSTOSCOPY WITH STENT PLACEMENT;  Surgeon: [name], MD;  Location: [facility];  Service: Urology;  Laterality: Right;   KNEE ARTHROSCOPY Right 2001  approx.   LOOP RECORDER INSERTION N/A 07/24/2020   Procedure: LOOP RECORDER INSERTION;  Surgeon: Evans Lance, MD;  Location: [place] [facility];  Service: Cardiovascular;  Laterality: N/A;   POSTERIOR LAMINECTOMY / DECOMPRESSION LUMBAR SPINE  09-01-2009   [facility]   TRANSURETHRAL RESECTION OF BLADDER TUMOR WITH MITOMYCIN-C Bilateral 01/23/2016   Procedure: CYSTOSCOPY ,TRANSURETHRAL RESECTION OF BLADDER TUMOR;  Surgeon: [name], MD;  Location: [facility];  Service: Urology;  Laterality: Bilateral;    There were no vitals filed for this visit.    Subjective Assessment - 08/23/20 1533     Subjective Patient presents to therapy s/p cerebellar stroke on 07/21/20. He states he was hospitalized  for about a week. He was having some trouble initially with speech and walking, also weakness. He says he has recovered well and is not having very much trouble at present. He reports no identifiable deficits currently. He says he has pretty much returned to normal with his day to day activity.    Pertinent History CVA 07/21/20    Currently in Pain? No/denies                [facility] PT Assessment - 08/23/20 0001       Assessment   Medical Diagnosis Cerebellar stroke    Referring Provider (PT) Charlynne Cousins MD    Onset Date/Surgical Date 07/21/20      Balance Screen   Has the patient fallen in the past 6 months No      [place] residence    Living  Arrangements Other relatives      Prior Function   Level of Independence Independent      Cognition   Overall Cognitive Status Within Functional Limits for tasks assessed      ROM / Strength   AROM / PROM / Strength Strength      Strength   Strength Assessment Site Hip;Knee;Ankle    Right/Left Hip Right;Left    Right Hip Flexion 5/5    Right Hip Extension 5/5    Right Hip ABduction 4+/5    Left Hip Flexion 5/5    Left Hip Extension 4+/5    Left Hip ABduction 4+/5    Right/Left Knee Right;Left    Right Knee Extension 5/5    Left Knee Extension 4+/5    Right/Left Ankle Right;Left    Right Ankle Dorsiflexion 5/5    Left Ankle Dorsiflexion 4+/5      Transfers   Five time sit to stand comments  13 sec with no UE      Ambulation/Gait   Ambulation/Gait Yes    Ambulation/Gait Assistance 7: Independent    Ambulation Distance (Feet) 360 Feet    Assistive device None    Gait Pattern Trendelenburg    Ambulation Surface Level;Indoor    Gait Comments 2MWT      Balance   Balance Assessed Yes      Static Standing Balance   Static Standing Balance -  Activities  Tandam Stance - Right Leg;Tandam Stance - Left Leg    Static Standing - Comment/# of Minutes > 20 seconds bilateral                        Objective measurements completed on examination: See above findings.       [facility] PT Treatment/Exercise - 08/23/20 0001       Ambulation/Gait   Stairs Yes    Stairs Assistance 6: Modified independent (Device/Increase time)    Stair Management Technique Two rails;Alternating pattern    Number of Stairs 8    Height of Stairs 7                    PT Education - 08/23/20 1634     Education Details on evaluation findings, and HEP    Person(s) Educated Patient    Methods Explanation;Handout    Comprehension Verbalized understanding              PT Short Term Goals - 08/23/20 1623       PT SHORT TERM GOAL #1   Title Patient will be  independent with initial HEP and self-management strategies to improve functional outcomes    Baseline Reviewed and issued HEP handout    Status Achieved                       Plan - 08/23/20 1557     Clinical Impression Statement Patient is a 70 yo male who presents to therapy following cerebellar stroke on 07/21/20. Performed therapy assessment. Patient function appears within functional limits for age and presentation. Slight weakness noted about LLE but no prevailing functional deficits. Able to ambulate and navigate stairs independently. Good static balance. Mild gait abnormality noted but patient reports history of lumbar and cardiac surgeries. Discussed and issued HEP for general strengthening and balance. Patient seen today for 1 x eval only and issuance of home program. Patient encouraged to follow up with therapy services with any further questions or concerns.    Examination-Activity Limitations Other    Examination-Participation Restrictions Yard Work;Community Activity    Stability/Clinical Decision Making Stable/Uncomplicated    Clinical Decision Making Low    Rehab Potential Excellent    PT Frequency 1x / week    PT Duration --   1 x eval only   PT Treatment/Interventions ADLs/Self Care Home Management;Therapeutic exercise;Patient/family education    PT Next Visit Plan 1 x eval only    PT Home Exercise Plan Eval: heel raise, sit/stands, standing hip abduction, tandem stance    Consulted and Agree with Plan of Care Patient             Patient will benefit from skilled therapeutic intervention in order to improve the following deficits and impairments:  Decreased strength  Visit Diagnosis: Muscle weakness (generalized)     Problem List Patient Active Problem List   Diagnosis Date Noted   Acute CVA (cerebrovascular accident) ([place]) 07/21/2020   Type 2 diabetes mellitus ([place]) 02/26/2016   Bladder cancer ([place]) 01/23/2016   Hyperlipidemia 02/21/2009    OTHER SPEC FORMS CHRONIC ISCHEMIC HEART DISEASE 02/21/2009   SHORTNESS OF BREATH 02/21/2009   TOBACCO ABUSE 02/13/2009   Hypertension 02/13/2009   CAD (coronary artery disease) 02/13/2009   4:36 PM, 08/23/20 Josue Hector PT DPT  Physical Therapist with [facility]  [phone] [place], [place], [address] Phone: [phone]   Fax:  [phone]  Name: Kenneth Kelly MRN: [id number] Date of Birth: [birth year]

## 2020-08-23 NOTE — Patient Instructions (Signed)
Access Code: [email or web address] URL: [email or web address] Date: 08/23/2020 Prepared by: Josue Hector  Exercises Sit to Stand with Arms Crossed - 1 x daily - 3 x weekly - 1 sets - 10 reps Heel Raises with Counter Support - 1 x daily - 3 x weekly - 2 sets - 10 reps Standing Hip Abduction with Counter Support - 1 x daily - 3 x weekly - 2 sets - 10 reps Standing Tandem Balance with Counter Support - 1 x daily - 3 x weekly - 1 sets - 4 reps - 20 second hold

## 2020-08-24 DIAGNOSIS — E118 Type 2 diabetes mellitus with unspecified complications: Secondary | ICD-10-CM | POA: Diagnosis not present

## 2020-08-24 DIAGNOSIS — Z6823 Body mass index (BMI) 23.0-23.9, adult: Secondary | ICD-10-CM | POA: Diagnosis not present

## 2020-08-24 DIAGNOSIS — I739 Peripheral vascular disease, unspecified: Secondary | ICD-10-CM | POA: Diagnosis not present

## 2020-08-24 DIAGNOSIS — E782 Mixed hyperlipidemia: Secondary | ICD-10-CM | POA: Diagnosis not present

## 2020-08-24 DIAGNOSIS — I25708 Atherosclerosis of coronary artery bypass graft(s), unspecified, with other forms of angina pectoris: Secondary | ICD-10-CM | POA: Diagnosis not present

## 2020-08-24 DIAGNOSIS — I714 Abdominal aortic aneurysm, without rupture: Secondary | ICD-10-CM | POA: Diagnosis not present

## 2020-08-24 DIAGNOSIS — F17290 Nicotine dependence, other tobacco product, uncomplicated: Secondary | ICD-10-CM | POA: Diagnosis not present

## 2020-08-24 DIAGNOSIS — M1991 Primary osteoarthritis, unspecified site: Secondary | ICD-10-CM | POA: Diagnosis not present

## 2020-08-24 DIAGNOSIS — I639 Cerebral infarction, unspecified: Secondary | ICD-10-CM | POA: Diagnosis not present

## 2020-08-25 ENCOUNTER — Encounter (HOSPITAL_COMMUNITY): Payer: Self-pay | Admitting: Speech Pathology

## 2020-08-25 NOTE — Therapy (Signed)
[facility] [address] [place], [place], [address] Phone: [phone]   Fax:  [phone]  Speech Language Pathology Evaluation  Patient Details  Name: Kenneth Kelly MRN: [id number] Date of Birth: May 01, 1950 No data recorded  Encounter Date: 08/23/2020   End of Session - 08/23/20 1416     Visit Number 1    Number of Visits 1    Authorization Type [employer] mcr hmo eff 09/05/19 - current; 20.00$ co-pay, non ded, oop max,$3,900.00/ $40.00 met, no visit limit, auth req via cohere, s/w anna ref#[id number]    SLP Start Time 1603    SLP Stop Time  1642    SLP Time Calculation (min) 39 min    Activity Tolerance Patient tolerated treatment well             Past Medical History:  Diagnosis Date   Abdominal aortic aneurysm (AAA) (HCC) followed by cardiologist-- dr Purvis Sheffield   per last CT 01-12-2016  Infrarenal aortic aneurysm 3.4cm   Arthritis    Hip   Bladder tumor    CAD (coronary artery disease)    CARDIOLOGIST-  DR Purvis Sheffield---  HX CABG X3 08-15-1999   First degree AV block    Gout    History of bladder cancer urologist-  dr Annabell Howells   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   History of fracture of pelvis    Hyperlipidemia 02/21/2009   Qualifier: Diagnosis of  By: Via LPN, Larita Fife     Hypertension    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   LIMA to LCFx, SVG to RCA, RIMA to LAD   Sinus bradycardia on ECG    Type 2 diabetes mellitus (HCC)     Past Surgical History:  Procedure Laterality Date   CARDIAC CATHETERIZATION  07-20-1999  dr wall   significant 2v CAD, total occlusion LAD an 80% LCFx neither amenable to PCI   CARDIOVASCULAR STRESS TEST  04-23-2016  dr Purvis Sheffield ([place])   Low risk nuclear study w/ small, mild intensity, reversible mid to apical inferior defect that is consistent to ischemia but no diagnotic ST segment changes to indicate ischemia/  normal LV function and wall motion ,  nuclear stress ef 57%    CORONARY ARTERY BYPASS GRAFT  08/15/1999   dr gerhardt [facility]   x3, LIMA to left circumflex, SVG to RCA, and right internal mammary artery graft to the LAD   CYSTOSCOPY WITH BIOPSY N/A 04/08/2017   Procedure: CYSTOSCOPY;  Surgeon: Bjorn Pippin, MD;  Location: [facility];  Service: Urology;  Laterality: N/A;   CYSTOSCOPY WITH STENT PLACEMENT Right 01/23/2016   Procedure: CYSTOSCOPY WITH STENT PLACEMENT;  Surgeon: Bjorn Pippin, MD;  Location: [facility];  Service: Urology;  Laterality: Right;   KNEE ARTHROSCOPY Right 2001  approx.   LOOP RECORDER INSERTION N/A 07/24/2020   Procedure: LOOP RECORDER INSERTION;  Surgeon: Marinus Maw, MD;  Location: [facility] INVASIVE CV LAB;  Service: Cardiovascular;  Laterality: N/A;   POSTERIOR LAMINECTOMY / DECOMPRESSION LUMBAR SPINE  09-01-2009   [facility]   TRANSURETHRAL RESECTION OF BLADDER TUMOR WITH MITOMYCIN-C Bilateral 01/23/2016   Procedure: CYSTOSCOPY ,TRANSURETHRAL RESECTION OF BLADDER TUMOR;  Surgeon: Bjorn Pippin, MD;  Location: [facility];  Service: Urology;  Laterality: Bilateral;    There were no vitals filed for this visit.       SLP Evaluation [facility] - 08/23/20 0001       SLP Visit Information   SLP Received On 08/23/20  Pain Assessment   Currently in Pain? No/denies      General Information   HPI Kenneth Kelly is a 70 y.o. male who presents for ST evaluation s/p large acute infarct to the right PICA territory and punctuated acute right occipital infarct with subacute right frontal cortical infarct on 07/21/20.  Past medical history significant for AAA, osteoarthritis of the hip, bladder tumor CABG x3 first-degree AV block, gout history of pelvic fracture, diabetes mellitus type 2 comes into the ED complaining of multiple episodes of emesis generalized weakness and unsteady gait with recent hospital visit. HH or OP ST f/u was recommended after Kenneth Kelly was seen for SLE in the acute setting.    Mobility Status Kenneth Kelly ambulated to ST without assist       Balance Screen   Has the patient fallen in the past 6 months No      Prior Functional Status   Cognitive/Linguistic Baseline Within functional limits    Type of Home Mobile home     Lives With Family      Cognition   Overall Cognitive Status Within Functional Limits for tasks assessed    Memory Impaired    Memory Impairment Decreased short term memory;Decreased recall of new information      Auditory Comprehension   Overall Auditory Comprehension Appears within functional limits for tasks assessed      Visual Recognition/Discrimination   Discrimination Not tested      Reading Comprehension   Reading Status Not tested      Expression   Primary Mode of Expression Verbal      Verbal Expression   Overall Verbal Expression Appears within functional limits for tasks assessed      Written Expression   Dominant Hand Right      Oral Motor/Sensory Function   Overall Oral Motor/Sensory Function Appears within functional limits for tasks assessed      Motor Speech   Overall Motor Speech Appears within functional limits for tasks assessed                  Plan - 08/25/20 1420     Clinical Impression Statement Kenneth Kelly was evaluated by means of subjective portions of various assessments to determine cognitive needs. Kenneth Kelly reports that he completed part of 7th grade but that he has "never been real good about thinking" and "didn't do much school". Kenneth Kelly does demonstrate mild impairment and specifically mild impairment in the area of memory, however, Kenneth Kelly reports this is not a change since the acute stroke and emphatically reports no cognitive change. It is likely and possible that current cognitive status is baseline, further, Kenneth Kelly does not desire to treat mild cognitive impairment and noted needs in areas of memory; SLP provided education and basic memory strategies and recommend no further ST f/u at this time.    Consulted and Agree with Plan of Care Patient             Patient will  benefit from skilled therapeutic intervention in order to improve the following deficits and impairments:   Acute CVA (cerebrovascular accident) [facility])    Problem List Patient Active Problem List   Diagnosis Date Noted   Acute CVA (cerebrovascular accident) ([place]) 07/21/2020   Type 2 diabetes mellitus ([place]) 02/26/2016   Bladder cancer ([place]) 01/23/2016   Hyperlipidemia 02/21/2009   OTHER SPEC FORMS CHRONIC ISCHEMIC HEART DISEASE 02/21/2009   SHORTNESS OF BREATH 02/21/2009   TOBACCO ABUSE 02/13/2009   Hypertension 02/13/2009   CAD (coronary  artery disease) 02/13/2009   Presten Joost H. Roddie Mc, CCC-SLP Speech Language Pathologist  Wende Bushy 08/25/2020, 2:30 PM  [facility] [address] [place], [place], [address] Phone: [phone]   Fax:  [phone]  Name: Kenneth Kelly MRN: [id number] Date of Birth: 1951/01/02

## 2020-09-03 DIAGNOSIS — I744 Embolism and thrombosis of arteries of extremities, unspecified: Secondary | ICD-10-CM | POA: Diagnosis not present

## 2020-09-03 DIAGNOSIS — I25708 Atherosclerosis of coronary artery bypass graft(s), unspecified, with other forms of angina pectoris: Secondary | ICD-10-CM | POA: Diagnosis not present

## 2020-09-03 DIAGNOSIS — E119 Type 2 diabetes mellitus without complications: Secondary | ICD-10-CM | POA: Diagnosis not present

## 2020-09-04 ENCOUNTER — Ambulatory Visit (INDEPENDENT_AMBULATORY_CARE_PROVIDER_SITE_OTHER): Payer: Medicare HMO

## 2020-09-04 ENCOUNTER — Other Ambulatory Visit: Payer: Self-pay

## 2020-09-04 DIAGNOSIS — I639 Cerebral infarction, unspecified: Secondary | ICD-10-CM

## 2020-09-04 NOTE — Patient Instructions (Signed)
Goals Addressed             This Visit's Progress    COMPLETED: Keep or Improve My Strength-Stroke   On track    Barriers: Knowledge Timeframe:  Short-Term Goal Priority:  High Start Date:   07/31/20                          Expected End Date:   10/04/20                    Follow Up Date 10/04/20    - attend 90 percent of physical therapy appointments - eat healthy to increase strength - increase activity or exercise time a little every week    Why is this important?   Before the stroke you probably did not think much about being safe when you are up and about.  Now, it may be harder for you to get around.  It may also be easier for you to trip or fall.  It is common to have muscle weakness after a stroke. You may also feel like you cannot control an arm or leg.  It will be helpful to work with a physical therapist to get your strength and muscle control back.  It is good to stay as active as you can. Walking and stretching help you stay strong and flexible.  The physical therapist will develop an exercise program just for you.     Notes: 07/31/20 Patient to start outpatient therapy.  Therapy to contact patient. Patient has cane for ambulation.   08/21/20 Patient has appointment with therapy this week. Encouraged patient to exercise and remain as active as possible. He verbalized understanding.   09/04/20 Patient evaluated by therapy and no needs found.  CM encouraged patient to remain active.       Monitor and Manage My Blood Sugar-Diabetes Type 2   On track    Barriers: Health Behaviors Knowledge Timeframe:  Long-Range Goal Priority:  High Start Date:  07/31/20                           Expected End Date: 02/03/21                      Follow Up Date  11/03/20   - check blood sugar at prescribed times - take the blood sugar log to all doctor visits    Why is this important?   Checking your blood sugar at home helps to keep it from getting very high or very low.  Writing the  results in a diary or log helps the doctor know how to care for you.  Your blood sugar log should have the time, date and the results.  Also, write down the amount of insulin or other medicine that you take.  Other information, like what you ate, exercise done and how you were feeling, will also be helpful.     Notes: A1c last report 7.0. Please check sugars as prescribed. Diabetes Management Discussed: Medication adherence Reviewed importance of limiting carbs such as rice, potatoes, breads, and pastas. Also discussed limiting sweets and sugary drinks.  Discussed importance of portion control.  Also discussed importance of annual exams, foot exams, and eye exams.    08/21/20 Patient reports sugars have been running good.  Last reported sugar 138.  Reiterated importance of diabetes management.  He verbalized understanding. Keep up the  great work!! 09/04/20 Patient sugars remain good per patient. Most recent sugar 122.  Reiterated diabetes management.

## 2020-09-04 NOTE — Patient Outreach (Signed)
Kenneth Kelly [facility]) Care Management  [place]  09/04/2020   Kenneth Kelly 1950-06-20 [id number]  Subjective: Telephone call to patient for follow up. Patient reports he is doing ok.  He reports that he had therapy consult and was released as he had no needs.  Patient has seen PCP for follow up. No changes.  Blood sugar this am 122. Reiterated diabetes management.   Objective:   Encounter Medications:  Outpatient Encounter Medications as of 09/04/2020  Medication Sig   acetaminophen (TYLENOL) 500 MG tablet Take 1,000-1,500 mg by mouth every 6 (six) hours as needed (Pain).   amLODipine (NORVASC) 10 MG tablet Take 10 mg by mouth every morning.    aspirin 81 MG EC tablet Take 1 tablet (81 mg total) by mouth daily. Swallow whole.   clopidogrel (PLAVIX) 75 MG tablet Take 1 tablet (75 mg total) by mouth daily.   lisinopril (PRINIVIL,ZESTRIL) 10 MG tablet Take 10 mg by mouth every morning.   metFORMIN (GLUCOPHAGE) 1000 MG tablet Take 500 mg by mouth 2 (two) times daily with a meal.   metoprolol (LOPRESSOR) 50 MG tablet Take 50 mg by mouth 2 (two) times daily.     nitroGLYCERIN (NITROSTAT) 0.4 MG SL tablet Place 1 tablet (0.4 mg total) under the tongue every 5 (five) minutes as needed for chest pain.   pantoprazole (PROTONIX) 40 MG tablet Take 1 tablet (40 mg total) by mouth daily.   simvastatin (ZOCOR) 20 MG tablet Take 20 mg by mouth every morning.   gabapentin (NEURONTIN) 300 MG capsule Take 900 mg by mouth 2 (two) times daily. (Patient not taking: No sig reported)   predniSONE (DELTASONE) 20 MG tablet Take 1 tablet (20 mg total) by mouth daily with breakfast. (Patient not taking: No sig reported)   No facility-administered encounter medications on file as of 09/04/2020.    Functional Status:  In your present state of health, do you have any difficulty performing the following activities: 09/04/2020 07/31/2020  Hearing? N N  Vision? N N  Difficulty concentrating or making  decisions? N N  Walking or climbing stairs? N N  Dressing or bathing? N N  Doing errands, shopping? N N  Preparing Food and eating ? N N  Using the Toilet? N N  In the past six months, have you accidently leaked urine? N N  Do you have problems with loss of bowel control? N N  Managing your Medications? Y Y  Comment sister in law helps sister in law helps  Managing your Finances? N N  Housekeeping or managing your Housekeeping? N N  Some recent data might be hidden    Fall/Depression Screening: Fall Risk  09/04/2020 07/31/2020  Falls in the past year? 0 0   PHQ 2/9 Scores 09/04/2020 07/31/2020  PHQ - 2 Score 0 0    Assessment:   Care Plan Care Plan : Stroke (Adult)  Updates made by Jon Billings, RN since 09/04/2020 12:00 AM     Problem: Recurrence (Stroke)      Goal: Stroke Recurrence Prevented or Minimized as evidenced by no readmission related to stroke Completed 09/04/2020  Start Date: 07/31/2020  Expected End Date: 02/03/2021  This Visit's Progress: On track  Recent Progress: On track  Note:   Evidence-based guidance:  Assess for sudden or new changes in attention, vision changes, language pattern, motor control and mobility, as well as facial drooping, unilateral weakness, confusion and fatigue that may indicate recurrence.  Promote long-term management of risk  factors, such as tobacco use, sedentary lifestyle, obesity, sleep disordered breathing, unhealthy diet and high alcohol consumption.  Prepare patient for cardiac monitoring (invasive or noninvasive) when cause of stroke is unknown; consider ankle-brachial index measurement as a means of predicting recurrence risk.  Monitor and manage comorbidity, such as atrial fibrillation, hypertension, carotid artery disease, cardiomyopathy, heart valve disease, sickle cell disease, coronary artery disease, sleep disordered breathing or depression.  Provide anticipatory guidance to women of childbearing age regarding potential  discontinuation of oral contraceptives and alternative family planning method.   Assess and address adherence to pharmacologic therapy that may include antihypertensive, antiplatelet, anticoagulant, statin, fibrate and vitamin supplement; monitor and manage side effects.   Notes:     Task: Optimize Stroke Recovery Completed 09/04/2020  Due Date: 02/03/2021  Priority: Routine  Responsible User: Jon Billings, RN  Note:   Care Management Activities:    - healthy lifestyle promoted - self or caregiver awareness of signs/symptoms of repeat stroke promoted - smoking counseling provided - stroke signs/symptoms assessed and compared to baseline    Notes: 07/31/20 Patient aware of stroke symptoms of  Sudden numbness or weakness in the face, arm, or leg, especially on one side of the body. Sudden confusion, trouble speaking, or difficulty understanding speech. Sudden trouble seeing in one or both eyes. Sudden trouble walking, dizziness, loss of balance, or lack of coordination. Sudden severe headache with no known cause. Call 9-1-1 right away if you or someone else has any of these symptoms.  08/03/20 Patient continues to smoke.  Discussed with patient smoking increases his risk for another stroke and other health problems such as lung cancer, emphysema and kidney disease. He verbalized understanding.  Patient not agreeable to at least cut back at this time.    08/21/20 Patient continues to smoke.  Reiterated that smoking increases risk for other health problems.  Patient monitoring his blood pressure regularly.  Most recent blood pressure 140/75.  Patient taking medications as prescribed.  Patient able to report signs of stroke such as problems walking, talking, thinking, and possible vision changes.  Patient knows to call 911 for changes related to stroke symptoms.  Patient starting  outpatient therapy this week.       Goals Addressed             This Visit's Progress    COMPLETED: Keep or  Improve My Strength-Stroke   On track    Barriers: Knowledge Timeframe:  Short-Term Goal Priority:  High Start Date:   07/31/20                          Expected End Date:   10/04/20                    Follow Up Date 10/04/20    - attend 90 percent of physical therapy appointments - eat healthy to increase strength - increase activity or exercise time a little every week    Why is this important?   Before the stroke you probably did not think much about being safe when you are up and about.  Now, it may be harder for you to get around.  It may also be easier for you to trip or fall.  It is common to have muscle weakness after a stroke. You may also feel like you cannot control an arm or leg.  It will be helpful to work with a physical therapist to get your strength and  muscle control back.  It is good to stay as active as you can. Walking and stretching help you stay strong and flexible.  The physical therapist will develop an exercise program just for you.     Notes: 07/31/20 Patient to start outpatient therapy.  Therapy to contact patient. Patient has cane for ambulation.   08/21/20 Patient has appointment with therapy this week. Encouraged patient to exercise and remain as active as possible. He verbalized understanding.   09/04/20 Patient evaluated by therapy and no needs found.  CM encouraged patient to remain active.       Monitor and Manage My Blood Sugar-Diabetes Type 2   On track    Barriers: Health Behaviors Knowledge Timeframe:  Long-Range Goal Priority:  High Start Date:  07/31/20                           Expected End Date: 02/03/21                      Follow Up Date  11/03/20   - check blood sugar at prescribed times - take the blood sugar log to all doctor visits    Why is this important?   Checking your blood sugar at home helps to keep it from getting very high or very low.  Writing the results in a diary or log helps the doctor know how to care for you.  Your  blood sugar log should have the time, date and the results.  Also, write down the amount of insulin or other medicine that you take.  Other information, like what you ate, exercise done and how you were feeling, will also be helpful.     Notes: A1c last report 7.0. Please check sugars as prescribed. Diabetes Management Discussed: Medication adherence Reviewed importance of limiting carbs such as rice, potatoes, breads, and pastas. Also discussed limiting sweets and sugary drinks.  Discussed importance of portion control.  Also discussed importance of annual exams, foot exams, and eye exams.    08/21/20 Patient reports sugars have been running good.  Last reported sugar 138.  Reiterated importance of diabetes management.  He verbalized understanding. Keep up the great work!! 09/04/20 Patient sugars remain good per patient. Most recent sugar 122.  Reiterated diabetes management.          Plan:  Follow-up: Patient agrees to Care Plan and Follow-up. Follow-up in 4/6 week(s)  [name], RN, MSN [facility] Care Management Coordinator Direct Line [phone] Cell [phone] Toll Free: [phone]  Fax: [phone]

## 2020-09-06 LAB — CUP PACEART REMOTE DEVICE CHECK
Date Time Interrogation Session: 20220802172630
Implantable Pulse Generator Implant Date: 20220620

## 2020-09-12 ENCOUNTER — Ambulatory Visit: Payer: Medicare HMO | Admitting: Cardiology

## 2020-09-12 ENCOUNTER — Encounter: Payer: Self-pay | Admitting: Cardiology

## 2020-09-12 ENCOUNTER — Other Ambulatory Visit: Payer: Self-pay

## 2020-09-12 ENCOUNTER — Telehealth: Payer: Self-pay | Admitting: Cardiology

## 2020-09-12 VITALS — BP 144/64 | HR 51 | Ht 62.0 in | Wt 128.0 lb

## 2020-09-12 DIAGNOSIS — I6523 Occlusion and stenosis of bilateral carotid arteries: Secondary | ICD-10-CM

## 2020-09-12 DIAGNOSIS — I2510 Atherosclerotic heart disease of native coronary artery without angina pectoris: Secondary | ICD-10-CM

## 2020-09-12 DIAGNOSIS — I714 Abdominal aortic aneurysm, without rupture, unspecified: Secondary | ICD-10-CM

## 2020-09-12 DIAGNOSIS — E782 Mixed hyperlipidemia: Secondary | ICD-10-CM

## 2020-09-12 DIAGNOSIS — I10 Essential (primary) hypertension: Secondary | ICD-10-CM | POA: Diagnosis not present

## 2020-09-12 MED ORDER — ATORVASTATIN CALCIUM 40 MG PO TABS: 40.0000 mg | ORAL_TABLET | Freq: Every day | ORAL | 6 refills | Status: DC

## 2020-09-12 NOTE — Telephone Encounter (Signed)
Checking percert on the following patient for testing scheduled at [facility].     AAA  09/19/2020

## 2020-09-12 NOTE — Patient Instructions (Addendum)
Medication Instructions:  Stop Plavix (Clopidogrel).  Stop Simvastatin (Zocor).   Begin Atorvastatin '40mg'$  daily. Continue all other medications.     Labwork: none  Testing/Procedures: Your physician has requested that you have an abdominal aorta duplex. During this test, an ultrasound is used to evaluate the aorta. Allow 30 minutes for this exam. Do not eat after midnight the day before and avoid carbonated beverages. Office will contact with results via phone or letter.     Follow-Up: 6 months   Any Other Special Instructions Will Be Listed Below (If Applicable).  If you need a refill on your cardiac medications before your next appointment, please call your pharmacy.

## 2020-09-12 NOTE — Progress Notes (Signed)
Clinical Summary Kenneth Kelly is a 70 y.o.male former patient of Dr Bronson Ing, last seen by him in Jan 2018.    1.CAD - 3 vessel CABG in July 2001 with LIMA to left circumflex, SVG to RCA, and right internal mammary artery graft to the LAD. 04/2016 nuclear stress: mild apical inferior ischemia. Low risk - 07/2020 echo LVEF 60-65%, no WMAs, grade I DD, mild RV dysfunction  - no recent chest pains. No SOB or DOE - compliant with meds   2.AAA Abdominal CT 01/12/16: Abdominal aortic aneurysm measuring 3.4 x 3.1 cm - has not had follow up imagin  3.HTN - compliant with meds    4. Hyperlipidemia -07/2020 TC 165 TG 142 HDL 55 LDL 82 - has been on simva '20mg'$   5.Cryptogenic stroke - loop recorder followed by EP -normal device check 09/2020 - from 07/22/20 neuro note recommended DAPT x 3 weeks, then ASA alone.   6. Sinus bradycardia - asymtpmatic - remains on lopressor.   7. Carotid stenosis - 07/2020 carotid US RICA [id number], LICA [id number] - from neuro note does not appear suspected as etiology of CVA, thought to be embolic event to right PICA  Past Medical History:  Diagnosis Date   Abdominal aortic aneurysm (AAA) ([place]) followed by cardiologist-- dr Bronson Ing   per last CT 01-12-2016  Infrarenal aortic aneurysm 3.4cm   Arthritis    Hip   Bladder tumor    CAD (coronary artery disease)    CARDIOLOGIST-  DR Bronson Ing---  HX CABG X3 08-15-1999   First degree AV block    Gout    History of bladder cancer urologist-  dr Jeffie Pollock   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   History of fracture of pelvis    Hyperlipidemia 02/21/2009   Qualifier: Diagnosis of  By: Via LPN, Jeani Hawking     Hypertension    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   LIMA to LCFx, SVG to RCA, RIMA to LAD   Sinus bradycardia on ECG    Type 2 diabetes mellitus (HCC)      No Known Allergies   Current Outpatient Medications  Medication Sig Dispense Refill   acetaminophen (TYLENOL) 500 MG tablet Take 1,000-1,500  mg by mouth every 6 (six) hours as needed (Pain).     amLODipine (NORVASC) 10 MG tablet Take 10 mg by mouth every morning.      aspirin 81 MG EC tablet Take 1 tablet (81 mg total) by mouth daily. Swallow whole. 30 tablet 11   clopidogrel (PLAVIX) 75 MG tablet Take 1 tablet (75 mg total) by mouth daily. 30 tablet 3   gabapentin (NEURONTIN) 300 MG capsule Take 900 mg by mouth 2 (two) times daily. (Patient not taking: No sig reported)     lisinopril (PRINIVIL,ZESTRIL) 10 MG tablet Take 10 mg by mouth every morning.     metFORMIN (GLUCOPHAGE) 1000 MG tablet Take 500 mg by mouth 2 (two) times daily with a meal.     metoprolol (LOPRESSOR) 50 MG tablet Take 50 mg by mouth 2 (two) times daily.       nitroGLYCERIN (NITROSTAT) 0.4 MG SL tablet Place 1 tablet (0.4 mg total) under the tongue every 5 (five) minutes as needed for chest pain. 25 tablet 3   pantoprazole (PROTONIX) 40 MG tablet Take 1 tablet (40 mg total) by mouth daily. 30 tablet 0   predniSONE (DELTASONE) 20 MG tablet Take 1 tablet (20 mg total) by mouth daily with breakfast. (Patient not  taking: No sig reported) 2 tablet 0   simvastatin (ZOCOR) 20 MG tablet Take 20 mg by mouth every morning.     No current facility-administered medications for this visit.     Past Surgical History:  Procedure Laterality Date   CARDIAC CATHETERIZATION  07-20-1999  dr wall   significant 2v CAD, total occlusion LAD an 80% LCFx neither amenable to PCI   CARDIOVASCULAR STRESS TEST  04-23-2016  dr Bronson Ing ([place])   Low risk nuclear study w/ small, mild intensity, reversible mid to apical inferior defect that is consistent to ischemia but no diagnotic ST segment changes to indicate ischemia/  normal LV function and wall motion ,  nuclear stress ef 57%   CORONARY ARTERY BYPASS GRAFT  08/15/1999   dr gerhardt [facility]   x3, LIMA to left circumflex, SVG to RCA, and right internal mammary artery graft to the LAD   CYSTOSCOPY WITH BIOPSY N/A 04/08/2017    Procedure: CYSTOSCOPY;  Surgeon: [name], MD;  Location: [facility];  Service: Urology;  Laterality: N/A;   CYSTOSCOPY WITH STENT PLACEMENT Right 01/23/2016   Procedure: CYSTOSCOPY WITH STENT PLACEMENT;  Surgeon: [name], MD;  Location: [facility];  Service: Urology;  Laterality: Right;   KNEE ARTHROSCOPY Right 2001  approx.   LOOP RECORDER INSERTION N/A 07/24/2020   Procedure: LOOP RECORDER INSERTION;  Surgeon: Evans Lance, MD;  Location: [place] [facility];  Service: Cardiovascular;  Laterality: N/A;   POSTERIOR LAMINECTOMY / DECOMPRESSION LUMBAR SPINE  09-01-2009   [facility]   TRANSURETHRAL RESECTION OF BLADDER TUMOR WITH MITOMYCIN-C Bilateral 01/23/2016   Procedure: CYSTOSCOPY ,TRANSURETHRAL RESECTION OF BLADDER TUMOR;  Surgeon: [name], MD;  Location: [facility];  Service: Urology;  Laterality: Bilateral;     No Known Allergies    Family History  Problem Relation Age of Onset   Heart disease Father    Heart attack Brother    Heart disease Brother    Heart disease Brother      Social History Kenneth Kelly reports that he has been smoking cigarettes. He has a 28.00 pack-year smoking history. He has never used smokeless tobacco. Kenneth Kelly reports current alcohol use.   Review of Systems CONSTITUTIONAL: No weight loss, fever, chills, weakness or fatigue.  HEENT: Eyes: No visual loss, blurred vision, double vision or yellow sclerae.No hearing loss, sneezing, congestion, runny nose or sore throat.  SKIN: No rash or itching.  CARDIOVASCULAR: per hpi RESPIRATORY: No shortness of breath, cough or sputum.  GASTROINTESTINAL: No anorexia, nausea, vomiting or diarrhea. No abdominal pain or blood.  GENITOURINARY: No burning on urination, no polyuria NEUROLOGICAL: No headache, dizziness, syncope, paralysis, ataxia, numbness or tingling in the extremities. No change in bowel or bladder control.  MUSCULOSKELETAL: No muscle, back pain, joint pain or stiffness.  LYMPHATICS: No  enlarged nodes. No history of splenectomy.  PSYCHIATRIC: No history of depression or anxiety.  ENDOCRINOLOGIC: No reports of sweating, cold or heat intolerance. No polyuria or polydipsia.  Marland Kitchen   Physical Examination Today's Vitals   09/12/20 1035  BP: (!) 144/64  Pulse: (!) 51  SpO2: 95%  Weight: 128 lb (58.1 kg)  Height: '5\' 2"'$  (1.575 m)   Body mass index is 23.41 kg/m.  Gen: resting comfortably, no acute distress HEENT: no scleral icterus, pupils equal round and reactive, no palptable cervical adenopathy,  CV: RRR, no m/r/g, no jvd Resp: Clear to auscultation bilaterally GI: abdomen is soft, non-tender, non-distended, normal bowel sounds, no hepatosplenomegaly MSK:  extremities are warm, no edema.  Skin: warm, no rash Neuro:  no focal deficits Psych: appropriate affect    Assessment and Plan  1.CAD - no recent symptoms, continue current meds - EKG today shows sinus brady no acute ischemic changes  2. AAA - needs repeat US, wil order  3. Carotid stenosis - needs repeat US next year - continue medical therapy  4. Hyperlipidemia - LDL above goal, change simva 20 mg to atorva '40mg'$  daily  5. HTN - mildly elevated, just too meds today - was well controlled during recent 07/2020 admission, continue to monitor  6. Sinus bradycardia - asymptomatic, HRs typically run in the 50s on this dose of lopressor -continue beta blocker at this time, can downtitrate over time if becomes neccesary .    Arnoldo Lenis, M.D.

## 2020-09-19 ENCOUNTER — Ambulatory Visit (HOSPITAL_COMMUNITY)
Admission: RE | Admit: 2020-09-19 | Discharge: 2020-09-19 | Disposition: A | Discharge status: Home / Self Care | Payer: Medicare HMO | Source: Ambulatory Visit | Attending: Cardiology | Admitting: Cardiology

## 2020-09-19 ENCOUNTER — Other Ambulatory Visit: Payer: Self-pay

## 2020-09-19 DIAGNOSIS — I714 Abdominal aortic aneurysm, without rupture, unspecified: Secondary | ICD-10-CM

## 2020-09-28 NOTE — Progress Notes (Signed)
Carelink Summary Report / Loop Recorder 

## 2020-10-06 ENCOUNTER — Ambulatory Visit (INDEPENDENT_AMBULATORY_CARE_PROVIDER_SITE_OTHER): Payer: Medicare HMO

## 2020-10-06 DIAGNOSIS — I639 Cerebral infarction, unspecified: Secondary | ICD-10-CM | POA: Diagnosis not present

## 2020-10-10 LAB — CUP PACEART REMOTE DEVICE CHECK
Date Time Interrogation Session: 20220904172626
Implantable Pulse Generator Implant Date: 20220620

## 2020-10-11 ENCOUNTER — Other Ambulatory Visit: Payer: Self-pay

## 2020-10-11 NOTE — Patient Outreach (Signed)
[place] [facility]) Care Management  10/11/2020  Kenneth Kelly Mar 24, 1950 [id number]   Telephone call to patient for disease management follow up.   No answer.  HIPAA compliant voice message left.    Plan: If no return call, RN CM will attempt patient again October.  [name], RN, MSN [place] Management Care Management Coordinator Direct Line [phone] Cell [phone] Toll Free: [phone]  Fax: [phone]

## 2020-10-18 NOTE — Progress Notes (Signed)
Carelink Summary Report / Loop Recorder 

## 2020-10-25 ENCOUNTER — Telehealth: Payer: Self-pay | Admitting: Cardiology

## 2020-10-25 NOTE — Telephone Encounter (Signed)
Attempt to reach, left message to return call. 

## 2020-10-25 NOTE — Telephone Encounter (Signed)
Laurine Blazer, LPN  2/57/4935  5:21 PM EDT     Left message to return call.   Arnoldo Lenis, MD  10/15/2020  8:52 AM EDT     Moderate aneurysm by ultrasound, not bad enough to be of concern, does need a repeat US in 1 year     Zandra Abts MD

## 2020-10-25 NOTE — Telephone Encounter (Signed)
Returning call for results  °

## 2020-10-26 NOTE — Telephone Encounter (Signed)
Left a message for pt to call office for results.

## 2020-10-30 ENCOUNTER — Telehealth: Payer: Self-pay | Admitting: Cardiology

## 2020-10-30 NOTE — Telephone Encounter (Signed)
RETURNING CALL  °

## 2020-10-30 NOTE — Telephone Encounter (Signed)
Left message for pt to call office for results

## 2020-10-30 NOTE — Telephone Encounter (Signed)
Patient returning call to [facility]

## 2020-10-30 NOTE — Telephone Encounter (Signed)
Laurine Blazer, LPN  8/54/6270  3:50 PM EDT Back to Top    Patient notified via detailed voice message.  Copy to pcp.

## 2020-10-31 NOTE — Telephone Encounter (Signed)
Reviewed Aorta US test results -   Moderate aneurysm by ultrasound, not bad enough to be of concern, does need a repeat US in 1 year Zandra Abts MD  Patient verbalized understanding.

## 2020-11-07 ENCOUNTER — Ambulatory Visit (INDEPENDENT_AMBULATORY_CARE_PROVIDER_SITE_OTHER): Payer: Medicare HMO

## 2020-11-07 DIAGNOSIS — I639 Cerebral infarction, unspecified: Secondary | ICD-10-CM

## 2020-11-08 ENCOUNTER — Other Ambulatory Visit: Payer: Self-pay

## 2020-11-08 NOTE — Patient Outreach (Signed)
[place] [facility]) Care Management  11/08/2020  SALAAM BATTERSHELL 06/21/1950 [id number]   Telephone call to patient for disease management follow up.   No answer.  HIPAA compliant voice message left.    Plan: If no return call, RN CM will attempt patient again in December.    [name], RN, MSN [place] Management Care Management Coordinator Direct Line [phone] Cell [phone] Toll Free: [phone]  Fax: [phone]

## 2020-11-10 DIAGNOSIS — I10 Essential (primary) hypertension: Secondary | ICD-10-CM | POA: Diagnosis not present

## 2020-11-10 DIAGNOSIS — I639 Cerebral infarction, unspecified: Secondary | ICD-10-CM | POA: Diagnosis not present

## 2020-11-10 DIAGNOSIS — F17290 Nicotine dependence, other tobacco product, uncomplicated: Secondary | ICD-10-CM | POA: Diagnosis not present

## 2020-11-10 DIAGNOSIS — E782 Mixed hyperlipidemia: Secondary | ICD-10-CM | POA: Diagnosis not present

## 2020-11-10 DIAGNOSIS — I2510 Atherosclerotic heart disease of native coronary artery without angina pectoris: Secondary | ICD-10-CM | POA: Diagnosis not present

## 2020-11-10 DIAGNOSIS — Z6823 Body mass index (BMI) 23.0-23.9, adult: Secondary | ICD-10-CM | POA: Diagnosis not present

## 2020-11-10 DIAGNOSIS — E118 Type 2 diabetes mellitus with unspecified complications: Secondary | ICD-10-CM | POA: Diagnosis not present

## 2020-11-10 DIAGNOSIS — M1991 Primary osteoarthritis, unspecified site: Secondary | ICD-10-CM | POA: Diagnosis not present

## 2020-11-10 DIAGNOSIS — I739 Peripheral vascular disease, unspecified: Secondary | ICD-10-CM | POA: Diagnosis not present

## 2020-11-13 LAB — CUP PACEART REMOTE DEVICE CHECK
Date Time Interrogation Session: 20221007172644
Implantable Pulse Generator Implant Date: 20220620

## 2020-11-15 NOTE — Progress Notes (Signed)
Carelink Summary Report / Loop Recorder 

## 2020-12-08 DIAGNOSIS — E118 Type 2 diabetes mellitus with unspecified complications: Secondary | ICD-10-CM | POA: Diagnosis not present

## 2020-12-08 DIAGNOSIS — E782 Mixed hyperlipidemia: Secondary | ICD-10-CM | POA: Diagnosis not present

## 2020-12-08 DIAGNOSIS — I25708 Atherosclerosis of coronary artery bypass graft(s), unspecified, with other forms of angina pectoris: Secondary | ICD-10-CM | POA: Diagnosis not present

## 2020-12-08 DIAGNOSIS — Z6823 Body mass index (BMI) 23.0-23.9, adult: Secondary | ICD-10-CM | POA: Diagnosis not present

## 2020-12-08 DIAGNOSIS — M1991 Primary osteoarthritis, unspecified site: Secondary | ICD-10-CM | POA: Diagnosis not present

## 2020-12-08 DIAGNOSIS — F17290 Nicotine dependence, other tobacco product, uncomplicated: Secondary | ICD-10-CM | POA: Diagnosis not present

## 2020-12-08 DIAGNOSIS — I639 Cerebral infarction, unspecified: Secondary | ICD-10-CM | POA: Diagnosis not present

## 2020-12-08 DIAGNOSIS — I10 Essential (primary) hypertension: Secondary | ICD-10-CM | POA: Diagnosis not present

## 2020-12-08 DIAGNOSIS — Z23 Encounter for immunization: Secondary | ICD-10-CM | POA: Diagnosis not present

## 2020-12-11 ENCOUNTER — Ambulatory Visit (INDEPENDENT_AMBULATORY_CARE_PROVIDER_SITE_OTHER): Payer: Medicare HMO

## 2020-12-11 DIAGNOSIS — I639 Cerebral infarction, unspecified: Secondary | ICD-10-CM

## 2020-12-11 DIAGNOSIS — I6389 Other cerebral infarction: Secondary | ICD-10-CM | POA: Diagnosis not present

## 2020-12-15 LAB — CUP PACEART REMOTE DEVICE CHECK
Date Time Interrogation Session: 20221109172759
Implantable Pulse Generator Implant Date: 20220620

## 2020-12-19 NOTE — Progress Notes (Signed)
Carelink Summary Report / Loop Recorder 

## 2020-12-20 DIAGNOSIS — Z8551 Personal history of malignant neoplasm of bladder: Secondary | ICD-10-CM | POA: Diagnosis not present

## 2021-01-16 ENCOUNTER — Ambulatory Visit (INDEPENDENT_AMBULATORY_CARE_PROVIDER_SITE_OTHER): Payer: Medicare HMO

## 2021-01-16 DIAGNOSIS — I639 Cerebral infarction, unspecified: Secondary | ICD-10-CM

## 2021-01-16 LAB — CUP PACEART REMOTE DEVICE CHECK
Date Time Interrogation Session: 20221212172706
Implantable Pulse Generator Implant Date: 20220620

## 2021-01-17 ENCOUNTER — Other Ambulatory Visit: Payer: Self-pay

## 2021-01-17 NOTE — Patient Outreach (Signed)
[place] [facility]) Care Management  01/17/2021  Kenneth Kelly 05-Jan-1951 [id number]   Telephone call to patient for disease management follow up.   No answer.  Unable to leave a message.    Plan: If no return call, RN CM will attempt patient again in March.  [name], RN, MSN [place] Management Care Management Coordinator Direct Line [phone] Cell [phone] Toll Free: [phone]  Fax: [phone]

## 2021-01-26 NOTE — Progress Notes (Signed)
Carelink Summary Report / Loop Recorder 

## 2021-02-19 ENCOUNTER — Ambulatory Visit (INDEPENDENT_AMBULATORY_CARE_PROVIDER_SITE_OTHER): Payer: Medicare HMO

## 2021-02-19 DIAGNOSIS — I639 Cerebral infarction, unspecified: Secondary | ICD-10-CM | POA: Diagnosis not present

## 2021-02-19 LAB — CUP PACEART REMOTE DEVICE CHECK
Date Time Interrogation Session: 20230115231247
Implantable Pulse Generator Implant Date: 20220620

## 2021-03-02 NOTE — Progress Notes (Signed)
Carelink Summary Report / Loop Recorder 

## 2021-03-27 ENCOUNTER — Ambulatory Visit: Payer: Medicare HMO | Admitting: Cardiology

## 2021-03-27 NOTE — Progress Notes (Unsigned)
Clinical Summary Kenneth Kelly is a 71 y.o.male  1.CAD - 3 vessel CABG in July 2001 with LIMA to left circumflex, SVG to RCA, and right internal mammary artery graft to the LAD. 04/2016 nuclear stress: mild apical inferior ischemia. Low risk - 07/2020 echo LVEF 60-65%, no WMAs, grade I DD, mild RV dysfunction   - no recent chest pains. No SOB or DOE - compliant with meds     2.AAA Abdominal CT 01/12/16: Abdominal aortic aneurysm measuring 3.4 x 3.1 cm - 09/2020 AAA [place]: 4.1 cm, needs repeat 09/2021   3.HTN - compliant with meds       4. Hyperlipidemia -07/2020 TC 165 TG 142 HDL 55 LDL 82 - has been on simva 20mg    5.Cryptogenic stroke - loop recorder followed by EP -normal device check 09/2020 - from 07/22/20 neuro note recommended DAPT x 3 weeks, then ASA alone.    6. Sinus bradycardia - asymtpmatic - remains on lopressor.    7. Carotid stenosis - 07/2020 carotid US RICA >76%, LICA 54-65% - from neuro note does not appear suspected as etiology of CVA, thought to be embolic event to right PICA Past Medical History:  Diagnosis Date   Abdominal aortic aneurysm (AAA) ([place]) followed by cardiologist-- dr Bronson Ing   per last CT 01-12-2016  Infrarenal aortic aneurysm 3.4cm   Arthritis    Hip   Bladder tumor    CAD (coronary artery disease)    CARDIOLOGIST-  DR Bronson Ing---  HX CABG X3 08-15-1999   First degree AV block    Gout    History of bladder cancer urologist-  dr Jeffie Pollock   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   History of fracture of pelvis    Hyperlipidemia 02/21/2009   Qualifier: Diagnosis of  By: Via LPN, Jeani Hawking     Hypertension    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   LIMA to LCFx, SVG to RCA, RIMA to LAD   Sinus bradycardia on ECG    Type 2 diabetes mellitus (HCC)      No Known Allergies   Current Outpatient Medications  Medication Sig Dispense Refill   acetaminophen (TYLENOL) 500 MG tablet Take 1,000-1,500 mg by mouth every 6 (six) hours as needed  (Pain).     amLODipine (NORVASC) 10 MG tablet Take 10 mg by mouth every morning.      aspirin 81 MG EC tablet Take 1 tablet (81 mg total) by mouth daily. Swallow whole. 30 tablet 11   atorvastatin (LIPITOR) 40 MG tablet Take 1 tablet (40 mg total) by mouth daily. 30 tablet 6   lisinopril (PRINIVIL,ZESTRIL) 10 MG tablet Take 10 mg by mouth every morning.     metFORMIN (GLUCOPHAGE) 1000 MG tablet Take 500 mg by mouth 2 (two) times daily with a meal.     metoprolol (LOPRESSOR) 50 MG tablet Take 50 mg by mouth 2 (two) times daily.       nitroGLYCERIN (NITROSTAT) 0.4 MG SL tablet Place 1 tablet (0.4 mg total) under the tongue every 5 (five) minutes as needed for chest pain. 25 tablet 3   pantoprazole (PROTONIX) 40 MG tablet Take 1 tablet (40 mg total) by mouth daily. 30 tablet 0   predniSONE (DELTASONE) 20 MG tablet Take 1 tablet (20 mg total) by mouth daily with breakfast. (Patient not taking: No sig reported) 2 tablet 0   No current facility-administered medications for this visit.     Past Surgical History:  Procedure Laterality Date  CARDIAC CATHETERIZATION  07-20-1999  dr wall   significant 2v CAD, total occlusion LAD an 80% LCFx neither amenable to PCI   CARDIOVASCULAR STRESS TEST  04-23-2016  dr Bronson Ing ([place])   Low risk nuclear study w/ small, mild intensity, reversible mid to apical inferior defect that is consistent to ischemia but no diagnotic ST segment changes to indicate ischemia/  normal LV function and wall motion ,  nuclear stress ef 57%   CORONARY ARTERY BYPASS GRAFT  08/15/1999   dr gerhardt [facility]   x3, LIMA to left circumflex, SVG to RCA, and right internal mammary artery graft to the LAD   CYSTOSCOPY WITH BIOPSY N/A 04/08/2017   Procedure: CYSTOSCOPY;  Surgeon: [name], MD;  [facility];  Service: Urology;  Laterality: N/A;   CYSTOSCOPY WITH STENT PLACEMENT Right 01/23/2016   Procedure: CYSTOSCOPY WITH STENT PLACEMENT;  Surgeon: [name], MD;  Location: [facility];  Service: Urology;  Laterality: Right;   KNEE ARTHROSCOPY Right 2001  approx.   LOOP RECORDER INSERTION N/A 07/24/2020   Procedure: LOOP RECORDER INSERTION;  Surgeon: Evans Lance, MD;  Location: [place] [facility];  Service: Cardiovascular;  Laterality: N/A;   POSTERIOR LAMINECTOMY / DECOMPRESSION LUMBAR SPINE  09-01-2009   [facility]   TRANSURETHRAL RESECTION OF BLADDER TUMOR WITH MITOMYCIN-C Bilateral 01/23/2016   Procedure: CYSTOSCOPY ,TRANSURETHRAL RESECTION OF BLADDER TUMOR;  Surgeon: [name], MD;  Location: [facility];  Service: Urology;  Laterality: Bilateral;     No Known Allergies    Family History  Problem Relation Age of Onset   Heart disease Father    Heart attack Brother    Heart disease Brother    Heart disease Brother      Social History Kenneth Kelly reports that he has been smoking cigarettes. He has a 28.00 pack-year smoking history. He has never used smokeless tobacco. Kenneth Kelly reports current alcohol use.   Review of Systems CONSTITUTIONAL: No weight loss, fever, chills, weakness or fatigue.  HEENT: Eyes: No visual loss, blurred vision, double vision or yellow sclerae.No hearing loss, sneezing, congestion, runny nose or sore throat.  SKIN: No rash or itching.  CARDIOVASCULAR:  RESPIRATORY: No shortness of breath, cough or sputum.  GASTROINTESTINAL: No anorexia, nausea, vomiting or diarrhea. No abdominal pain or blood.  GENITOURINARY: No burning on urination, no polyuria NEUROLOGICAL: No headache, dizziness, syncope, paralysis, ataxia, numbness or tingling in the extremities. No change in bowel or bladder control.  MUSCULOSKELETAL: No muscle, back pain, joint pain or stiffness.  LYMPHATICS: No enlarged nodes. No history of splenectomy.  PSYCHIATRIC: No history of depression or anxiety.  ENDOCRINOLOGIC: No reports of sweating, cold or heat intolerance. No polyuria or polydipsia.  Marland Kitchen   Physical Examination There were no vitals filed  for this visit. There were no vitals filed for this visit.  Gen: resting comfortably, no acute distress HEENT: no scleral icterus, pupils equal round and reactive, no palptable cervical adenopathy,  CV Resp: Clear to auscultation bilaterally GI: abdomen is soft, non-tender, non-distended, normal bowel sounds, no hepatosplenomegaly MSK: extremities are warm, no edema.  Skin: warm, no rash Neuro:  no focal deficits Psych: appropriate affect   Diagnostic Studies     Assessment and Plan   1.CAD - no recent symptoms, continue current meds - EKG today shows sinus brady no acute ischemic changes   2. AAA - needs repeat US, wil order   3. Carotid stenosis - needs repeat US next year - continue  medical therapy   4. Hyperlipidemia - LDL above goal, change simva 20 mg to atorva 40mg  daily   5. HTN - mildly elevated, just took meds today - was well controlled during recent 07/2020 admission, continue to monitor   6. Sinus bradycardia - asymptomatic, HRs typically run in the 50s on this dose of lopressor -continue beta blocker at this time, can downtitrate over time if becomes neccesary .     Arnoldo Lenis, M.D., F.A.C.C.

## 2021-04-13 ENCOUNTER — Other Ambulatory Visit: Payer: Self-pay

## 2021-04-13 NOTE — Patient Outreach (Signed)
[place] [facility]) Care Management ? ?04/13/2021 ? ?Lisabeth Register ?1950/08/01 ?[id number] ? ? ?Telephone call to patient for disease management follow up.   No answer.  HIPAA compliant voice message left.   ? ?Plan: If no return call, RN CM will attempt patient again in the month of June.  ? ?[name], RN, MSN ?[facility] Care Management ?Care Management Coordinator ?Direct Line [phone] ?Toll Free: [phone]  ?Fax: [phone] ? ?

## 2021-04-16 DIAGNOSIS — E119 Type 2 diabetes mellitus without complications: Secondary | ICD-10-CM | POA: Diagnosis not present

## 2021-04-30 ENCOUNTER — Other Ambulatory Visit: Payer: Self-pay | Admitting: *Deleted

## 2021-04-30 ENCOUNTER — Ambulatory Visit (INDEPENDENT_AMBULATORY_CARE_PROVIDER_SITE_OTHER): Payer: Medicare HMO

## 2021-04-30 DIAGNOSIS — I714 Abdominal aortic aneurysm, without rupture, unspecified: Secondary | ICD-10-CM

## 2021-04-30 DIAGNOSIS — I639 Cerebral infarction, unspecified: Secondary | ICD-10-CM | POA: Diagnosis not present

## 2021-04-30 MED ORDER — ATORVASTATIN CALCIUM 40 MG PO TABS: 40.0000 mg | ORAL_TABLET | Freq: Every day | ORAL | 1 refills | Status: DC

## 2021-05-01 LAB — CUP PACEART REMOTE DEVICE CHECK
Date Time Interrogation Session: 20230324230809
Implantable Pulse Generator Implant Date: 20220620

## 2021-05-10 NOTE — Progress Notes (Signed)
Carelink Summary Report / Loop Recorder 

## 2021-05-31 ENCOUNTER — Encounter: Payer: Self-pay | Admitting: Cardiology

## 2021-05-31 ENCOUNTER — Ambulatory Visit: Payer: Medicare HMO | Admitting: Cardiology

## 2021-05-31 VITALS — BP 104/50 | HR 56 | Ht 62.0 in | Wt 129.0 lb

## 2021-05-31 DIAGNOSIS — I2510 Atherosclerotic heart disease of native coronary artery without angina pectoris: Secondary | ICD-10-CM | POA: Diagnosis not present

## 2021-05-31 DIAGNOSIS — E782 Mixed hyperlipidemia: Secondary | ICD-10-CM

## 2021-05-31 DIAGNOSIS — I6523 Occlusion and stenosis of bilateral carotid arteries: Secondary | ICD-10-CM | POA: Diagnosis not present

## 2021-05-31 DIAGNOSIS — I714 Abdominal aortic aneurysm, without rupture, unspecified: Secondary | ICD-10-CM | POA: Diagnosis not present

## 2021-05-31 DIAGNOSIS — I10 Essential (primary) hypertension: Secondary | ICD-10-CM

## 2021-05-31 LAB — CUP PACEART REMOTE DEVICE CHECK
Date Time Interrogation Session: 20230426231422
Implantable Pulse Generator Implant Date: 20220620

## 2021-05-31 MED ORDER — METOPROLOL TARTRATE 25 MG PO TABS: 25.0000 mg | ORAL_TABLET | Freq: Two times a day (BID) | ORAL | 1 refills | Status: DC

## 2021-05-31 NOTE — Progress Notes (Signed)
? ? ? ?Clinical Summary ?Kenneth Kelly is a 71 y.o.male seen today for follow up of the following medical problems.  ? ?1.CAD ?- 3 vessel CABG in July 2001 with LIMA to left circumflex, SVG to RCA, and right internal mammary artery graft to the LAD. ?04/2016 nuclear stress: mild apical inferior ischemia. Low risk ?- 07/2020 echo LVEF 60-65%, no WMAs, grade I DD, mild RV dysfunction ?  ?- no chest pains, no SOB/DOE ?- compliant with meds ?  ?  ?2.AAA ?Abdominal CT 01/12/16: Abdominal aortic aneurysm measuring 3.4 x 3.1 cm ?-09/2020 AAA [place]: 4.1 cm AAA ?  ?3.HTN ?- compliant with meds ?  ?  ?  ?4. Hyperlipidemia ?-07/2020 TC 165 TG 142 HDL 55 LDL 82. At that  time simva 20 changed to atorva '40mg'$  ?- upcoming labs with pcp ? ?  ?5.Cryptogenic stroke ?- loop recorder followed by EP ?-normal device check 09/2020 ?- from 07/22/20 neuro note recommended DAPT x 3 weeks, then ASA alone.  ?  ?04/2021 normal check ? ?6. Sinus bradycardia ?- mild dizziness ?- remains on lopressor.  ?  ?7. Carotid stenosis ?- 07/2020 carotid US RICA >38%, LICA 46-65% ?- from neuro note does not appear suspected as etiology of CVA, thought to be embolic event to right PICA ?  ?Past Medical History:  ?Diagnosis Date  ? Abdominal aortic aneurysm (AAA) [facility]) followed by cardiologist-- dr Bronson Ing  ? per last CT 01-12-2016  Infrarenal aortic aneurysm 3.4cm  ? Arthritis   ? Hip  ? Bladder tumor   ? CAD (coronary artery disease)   ? CARDIOLOGIST-  DR Bronson Ing---  HX CABG X3 08-15-1999  ? First degree AV block   ? Gout   ? History of bladder cancer urologist-  dr Jeffie Pollock  ? 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  ? History of fracture of pelvis   ? Hyperlipidemia 02/21/2009  ? Qualifier: Diagnosis of  By: Via LPN, Jeani Hawking    ? Hypertension   ? 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  ? LIMA to LCFx, SVG to RCA, RIMA to LAD  ? Sinus bradycardia on ECG   ? Type 2 diabetes mellitus ([place])   ? ? ? ?No Known Allergies ? ? ?Current Outpatient Medications  ?Medication Sig  Dispense Refill  ? acetaminophen (TYLENOL) 500 MG tablet Take 1,000-1,500 mg by mouth every 6 (six) hours as needed (Pain).    ? amLODipine (NORVASC) 10 MG tablet Take 10 mg by mouth every morning.     ? aspirin 81 MG EC tablet Take 1 tablet (81 mg total) by mouth daily. Swallow whole. 30 tablet 11  ? atorvastatin (LIPITOR) 40 MG tablet Take 1 tablet (40 mg total) by mouth daily. 90 tablet 1  ? lisinopril (PRINIVIL,ZESTRIL) 10 MG tablet Take 10 mg by mouth every morning.    ? metFORMIN (GLUCOPHAGE) 1000 MG tablet Take 500 mg by mouth 2 (two) times daily with a meal.    ? metoprolol (LOPRESSOR) 50 MG tablet Take 50 mg by mouth 2 (two) times daily.      ? nitroGLYCERIN (NITROSTAT) 0.4 MG SL tablet Place 1 tablet (0.4 mg total) under the tongue every 5 (five) minutes as needed for chest pain. 25 tablet 3  ? pantoprazole (PROTONIX) 40 MG tablet Take 1 tablet (40 mg total) by mouth daily. 30 tablet 0  ? predniSONE (DELTASONE) 20 MG tablet Take 1 tablet (20 mg total) by mouth daily with breakfast. (Patient not taking: No sig reported) 2  tablet 0  ? ?No current facility-administered medications for this visit.  ? ? ? ?Past Surgical History:  ?Procedure Laterality Date  ? CARDIAC CATHETERIZATION  07-20-1999  dr wall  ? significant 2v CAD, total occlusion LAD an 80% LCFx neither amenable to PCI  ? CARDIOVASCULAR STRESS TEST  04-23-2016  dr Bronson Ing ([place])  ? Low risk nuclear study w/ small, mild intensity, reversible mid to apical inferior defect that is consistent to ischemia but no diagnotic ST segment changes to indicate ischemia/  normal LV function and wall motion ,  nuclear stress ef 57%  ? CORONARY ARTERY BYPASS GRAFT  08/15/1999   dr gerhardt [facility]  ? x3, LIMA to left circumflex, SVG to RCA, and right internal mammary artery graft to the LAD  ? CYSTOSCOPY WITH BIOPSY N/A 04/08/2017  ? Procedure: CYSTOSCOPY;  Surgeon: [name], MD;  Location: [facility];  Service: Urology;  Laterality: N/A;  ?  CYSTOSCOPY WITH STENT PLACEMENT Right 01/23/2016  ? Procedure: CYSTOSCOPY WITH STENT PLACEMENT;  Surgeon: [name], MD;  Location: [facility];  Service: Urology;  Laterality: Right;  ? KNEE ARTHROSCOPY Right 2001  approx.  ? LOOP RECORDER INSERTION N/A 07/24/2020  ? Procedure: LOOP RECORDER INSERTION;  Surgeon: Evans Lance, MD;  Location: [facility];  Service: Cardiovascular;  Laterality: N/A;  ? POSTERIOR LAMINECTOMY / DECOMPRESSION LUMBAR SPINE  09-01-2009   [facility]  ? TRANSURETHRAL RESECTION OF BLADDER TUMOR WITH MITOMYCIN-C Bilateral 01/23/2016  ? Procedure: CYSTOSCOPY ,TRANSURETHRAL RESECTION OF BLADDER TUMOR;  Surgeon: [name], MD;  Location: [facility];  Service: Urology;  Laterality: Bilateral;  ? ? ? ?No Known Allergies ? ? ? ?Family History  ?Problem Relation Age of Onset  ? Heart disease Father   ? Heart attack Brother   ? Heart disease Brother   ? Heart disease Brother   ? ? ? ?Social History ?Kenneth Kelly reports that he has been smoking cigarettes. He has a 28.00 pack-year smoking history. He has never used smokeless tobacco. ?Kenneth Kelly reports current alcohol use. ? ? ?Review of Systems ?CONSTITUTIONAL: No weight loss, fever, chills, weakness or fatigue.  ?HEENT: Eyes: No visual loss, blurred vision, double vision or yellow sclerae.No hearing loss, sneezing, congestion, runny nose or sore throat.  ?SKIN: No rash or itching.  ?CARDIOVASCULAR: per hpi ?RESPIRATORY: No shortness of breath, cough or sputum.  ?GASTROINTESTINAL: No anorexia, nausea, vomiting or diarrhea. No abdominal pain or blood.  ?GENITOURINARY: No burning on urination, no polyuria ?NEUROLOGICAL: No headache, dizziness, syncope, paralysis, ataxia, numbness or tingling in the extremities. No change in bowel or bladder control.  ?MUSCULOSKELETAL: No muscle, back pain, joint pain or stiffness.  ?LYMPHATICS: No enlarged nodes. No history of splenectomy.  ?PSYCHIATRIC: No history of depression or anxiety.  ?ENDOCRINOLOGIC: No reports of  sweating, cold or heat intolerance. No polyuria or polydipsia.  ?. ? ? ?Physical Examination ?Today's Vitals  ? 05/31/21 1109  ?BP: (!) 104/50  ?Pulse: (!) 56  ?SpO2: 93%  ?Weight: 129 lb (58.5 kg)  ?Height: '5\' 2"'$  (1.575 m)  ? ?Body mass index is 23.59 kg/m?. ? ?Gen: resting comfortably, no acute distress ?HEENT: no scleral icterus, pupils equal round and reactive, no palptable cervical adenopathy,  ?CV: regular, brady 55, no m/r/g no jvd ?Resp: Clear to auscultation bilaterally ?GI: abdomen is soft, non-tender, non-distended, normal bowel sounds, no hepatosplenomegaly ?MSK: extremities are warm, no edema.  ?Skin: warm, no rash ?Neuro:  no focal deficits ?Psych: appropriate affect ? ? ? ? ?  Assessment and Plan  ? ?1.CAD ?- no symptoms, continue current meds ?  ?2. AAA ?- repeat US 09/2021 ?  ?3. Carotid stenosis ?- repeat US 09/2021 ? ?4. Hyperlipidemia ?- upcoming labs with pcp, continue atorvastaitn ?  ?5. HTN ?- at goal, continue current meds ? ?6. Sinus bradycardia ?- lower lopressor to '25mg'$ bid.  ? ?F/u 6 months ?  ? ? ? ? ?Arnoldo Lenis, M.D. ?

## 2021-05-31 NOTE — Patient Instructions (Signed)
Medication Instructions:  ?Decrease Lopressor to '25mg'$  twice a day   ?Continue all other medications.    ? ?Labwork: ?none ? ?Testing/Procedures: ?Your physician has requested that you have a carotid duplex. This test is an ultrasound of the carotid arteries in your neck. It looks at blood flow through these arteries that supply the brain with blood. Allow one hour for this exam. There are no restrictions or special instructions. ?Your physician has requested that you have an abdominal aorta duplex. During this test, an ultrasound is used to evaluate the aorta. Allow 30 minutes for this exam. Do not eat after midnight the day before and avoid carbonated beverages  ?Both test above due in this August  ?Office will contact with results via phone or letter.    ? ?Follow-Up: ?6 months  ? ?Any Other Special Instructions Will Be Listed Below (If Applicable). ? ? ?If you need a refill on your cardiac medications before your next appointment, please call your pharmacy. ? ?

## 2021-06-04 ENCOUNTER — Ambulatory Visit (INDEPENDENT_AMBULATORY_CARE_PROVIDER_SITE_OTHER): Payer: Medicare HMO

## 2021-06-04 DIAGNOSIS — I639 Cerebral infarction, unspecified: Secondary | ICD-10-CM

## 2021-06-19 NOTE — Progress Notes (Signed)
Carelink Summary Report / Loop Recorder 

## 2021-07-04 DIAGNOSIS — I10 Essential (primary) hypertension: Secondary | ICD-10-CM | POA: Diagnosis not present

## 2021-07-04 DIAGNOSIS — K219 Gastro-esophageal reflux disease without esophagitis: Secondary | ICD-10-CM | POA: Diagnosis not present

## 2021-07-04 DIAGNOSIS — E118 Type 2 diabetes mellitus with unspecified complications: Secondary | ICD-10-CM | POA: Diagnosis not present

## 2021-07-11 ENCOUNTER — Other Ambulatory Visit: Payer: Self-pay

## 2021-07-11 NOTE — Patient Outreach (Signed)
[facility]) Care Management  07/11/2021  ZACARIAH BELUE 06-03-1950 [id number]   Multiple attempts to establish contact with patient without success. No response from letter mailed to patient.   Plan: RN CM will close case at this time.    [name], RN, MSN [facility] Care Management Care Management Coordinator Direct Line [phone] Toll Free: [phone]  Fax: [phone]

## 2021-07-13 ENCOUNTER — Ambulatory Visit: Payer: Self-pay

## 2021-09-03 DIAGNOSIS — Z6822 Body mass index (BMI) 22.0-22.9, adult: Secondary | ICD-10-CM | POA: Diagnosis not present

## 2021-09-03 DIAGNOSIS — I2510 Atherosclerotic heart disease of native coronary artery without angina pectoris: Secondary | ICD-10-CM | POA: Diagnosis not present

## 2021-09-03 DIAGNOSIS — Z1331 Encounter for screening for depression: Secondary | ICD-10-CM | POA: Diagnosis not present

## 2021-09-03 DIAGNOSIS — E782 Mixed hyperlipidemia: Secondary | ICD-10-CM | POA: Diagnosis not present

## 2021-09-03 DIAGNOSIS — I10 Essential (primary) hypertension: Secondary | ICD-10-CM | POA: Diagnosis not present

## 2021-09-03 DIAGNOSIS — F17290 Nicotine dependence, other tobacco product, uncomplicated: Secondary | ICD-10-CM | POA: Diagnosis not present

## 2021-09-03 DIAGNOSIS — Z0001 Encounter for general adult medical examination with abnormal findings: Secondary | ICD-10-CM | POA: Diagnosis not present

## 2021-09-03 DIAGNOSIS — I639 Cerebral infarction, unspecified: Secondary | ICD-10-CM | POA: Diagnosis not present

## 2021-09-03 DIAGNOSIS — E118 Type 2 diabetes mellitus with unspecified complications: Secondary | ICD-10-CM | POA: Diagnosis not present

## 2021-09-12 ENCOUNTER — Ambulatory Visit (INDEPENDENT_AMBULATORY_CARE_PROVIDER_SITE_OTHER): Payer: Medicare HMO

## 2021-09-12 DIAGNOSIS — I714 Abdominal aortic aneurysm, without rupture, unspecified: Secondary | ICD-10-CM

## 2021-09-12 DIAGNOSIS — I6523 Occlusion and stenosis of bilateral carotid arteries: Secondary | ICD-10-CM | POA: Diagnosis not present

## 2021-09-12 DIAGNOSIS — I7143 Infrarenal abdominal aortic aneurysm, without rupture: Secondary | ICD-10-CM

## 2021-09-17 ENCOUNTER — Ambulatory Visit: Payer: Medicare HMO

## 2021-09-26 ENCOUNTER — Telehealth: Payer: Self-pay

## 2021-09-26 DIAGNOSIS — I6523 Occlusion and stenosis of bilateral carotid arteries: Secondary | ICD-10-CM

## 2021-09-26 DIAGNOSIS — I739 Peripheral vascular disease, unspecified: Secondary | ICD-10-CM

## 2021-09-26 NOTE — Telephone Encounter (Signed)
-----   Message from Arnoldo Lenis, MD sent at 09/26/2021  9:11 AM EDT ----- Severe blockage on the right, moderate on the left. Please refer to vascular   Zandra Abts MD

## 2021-09-26 NOTE — Telephone Encounter (Signed)
Patient notified and verbalized understanding. Patient had no questions or concerns at this time. PCP copied. Referral sent to [facility]

## 2021-09-26 NOTE — Addendum Note (Signed)
Addended by: Berlinda Last on: 09/26/2021 09:24 AM   Modules accepted: Orders

## 2021-09-27 NOTE — Telephone Encounter (Signed)
Sister in law calling in bout patient results. Please advise

## 2021-09-27 NOTE — Telephone Encounter (Signed)
Spoke to pt's SIL (verbal permission given over the phone by pt) and voiced results of Carotid US and AAA [place]. Informed pt's SIL that pt had been referred to [facility] and they will give him a call w/ 1st appt info. SIL had no further questions or concerns at this time.

## 2021-10-09 ENCOUNTER — Ambulatory Visit (INDEPENDENT_AMBULATORY_CARE_PROVIDER_SITE_OTHER): Payer: Medicare HMO

## 2021-10-09 DIAGNOSIS — I639 Cerebral infarction, unspecified: Secondary | ICD-10-CM | POA: Diagnosis not present

## 2021-10-10 ENCOUNTER — Ambulatory Visit: Payer: Medicare HMO | Admitting: Vascular Surgery

## 2021-10-10 ENCOUNTER — Encounter: Payer: Self-pay | Admitting: Vascular Surgery

## 2021-10-10 ENCOUNTER — Other Ambulatory Visit: Payer: Self-pay | Admitting: *Deleted

## 2021-10-10 ENCOUNTER — Ambulatory Visit (INDEPENDENT_AMBULATORY_CARE_PROVIDER_SITE_OTHER): Payer: Medicare HMO

## 2021-10-10 VITALS — BP 171/85 | HR 51 | Temp 97.9°F | Ht 62.0 in | Wt 128.0 lb

## 2021-10-10 DIAGNOSIS — I739 Peripheral vascular disease, unspecified: Secondary | ICD-10-CM | POA: Diagnosis not present

## 2021-10-10 DIAGNOSIS — I7143 Infrarenal abdominal aortic aneurysm, without rupture: Secondary | ICD-10-CM | POA: Diagnosis not present

## 2021-10-10 DIAGNOSIS — I6521 Occlusion and stenosis of right carotid artery: Secondary | ICD-10-CM

## 2021-10-10 LAB — CUP PACEART REMOTE DEVICE CHECK
Date Time Interrogation Session: 20230905231151
Implantable Pulse Generator Implant Date: 20220620

## 2021-10-10 NOTE — Progress Notes (Signed)
[facility] [place]  Patient name: Kenneth Kelly MRN: [id number] DOB: 09-11-1950 Sex: male  REASON FOR VISIT: Evaluation of diffuse peripheral vascular disease  HPI: Kenneth Kelly is a 71 y.o. male here today for discussion of recent noninvasive studies.  He does have known history of asymptomatic infrarenal abdominal arctic aneurysm.  He also has bilateral carotid stenosis.  He recently underwent noninvasive studies and is here today for further discussion of this.  He also has some lower extremity arterial sufficiency and underwent noninvasive lower extremity studies today as well.  He had a history of prior right posterior circulation stroke in 2022.  Has no symptoms referable to his aneurysm.  He does have history of back surgery and degenerative disc disease and has some difficulty walking related to this.  He does not have true claudication type symptoms.  He has a remote history of coronary bypass grafting 20 years ago.  Past Medical History:  Diagnosis Date   Abdominal aortic aneurysm (AAA) ([place]) followed by cardiologist-- dr Bronson Ing   per last CT 01-12-2016  Infrarenal aortic aneurysm 3.4cm   Arthritis    Hip   Bladder tumor    CAD (coronary artery disease)    CARDIOLOGIST-  DR Bronson Ing---  HX CABG X3 08-15-1999   First degree AV block    Gout    History of bladder cancer urologist-  dr Jeffie Pollock   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   History of fracture of pelvis    Hyperlipidemia 02/21/2009   Qualifier: Diagnosis of  By: Via LPN, Jeani Hawking     Hypertension    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   LIMA to LCFx, SVG to RCA, RIMA to LAD   Sinus bradycardia on ECG    Type 2 diabetes mellitus ([place])     Family History  Problem Relation Age of Onset   Heart disease Father    Heart attack Brother    Heart disease Brother    Heart disease Brother     SOCIAL HISTORY: Social History   Tobacco Use   Smoking  status: Every Day    Packs/day: 1.00    Years: 56.00    Total pack years: 56.00    Types: Cigarettes   Smokeless tobacco: Never  Substance Use Topics   Alcohol use: Yes    Comment: rare    No Known Allergies  Current Outpatient Medications  Medication Sig Dispense Refill   acetaminophen (TYLENOL) 500 MG tablet Take 1,000-1,500 mg by mouth every 6 (six) hours as needed (Pain).     amLODipine (NORVASC) 10 MG tablet Take 10 mg by mouth every morning.      aspirin 81 MG EC tablet Take 1 tablet (81 mg total) by mouth daily. Swallow whole. 30 tablet 11   clopidogrel (PLAVIX) 75 MG tablet Take 75 mg by mouth daily.     lisinopril (PRINIVIL,ZESTRIL) 10 MG tablet Take 10 mg by mouth every morning.     metoprolol tartrate (LOPRESSOR) 25 MG tablet Take 1 tablet (25 mg total) by mouth 2 (two) times daily. 180 tablet 1   nitroGLYCERIN (NITROSTAT) 0.4 MG SL tablet Place 1 tablet (0.4 mg total) under the tongue every 5 (five) minutes as needed for chest pain. 25 tablet 3   atorvastatin (LIPITOR) 40 MG tablet Take 1 tablet (40 mg total) by mouth daily. 90 tablet 1   metFORMIN (GLUCOPHAGE) 1000 MG tablet Take 500 mg by mouth 2 (two) times daily  with a meal. (Patient not taking: Reported on 10/10/2021)     pantoprazole (PROTONIX) 40 MG tablet Take 1 tablet (40 mg total) by mouth daily. 30 tablet 0   predniSONE (DELTASONE) 20 MG tablet Take 1 tablet (20 mg total) by mouth daily with breakfast. (Patient not taking: Reported on 07/31/2020) 2 tablet 0   No current facility-administered medications for this visit.    REVIEW OF SYSTEMS:  '[X]'$  denotes positive finding, '[ ]'$  denotes negative finding Cardiac  Comments:  Chest pain or chest pressure:    Shortness of breath upon exertion:    Short of breath when lying flat:    Irregular heart rhythm:        Vascular    Pain in calf, thigh, or hip brought on by ambulation:    Pain in feet at night that wakes you up from your sleep:     Blood clot in your  veins:    Leg swelling:           PHYSICAL EXAM: Vitals:   10/10/21 1017  BP: (!) 171/85  Pulse: (!) 51  Temp: 97.9 F (36.6 C)  SpO2: 95%  Weight: 128 lb (58.1 kg)  Height: '5\' 2"'$  (1.575 m)    GENERAL: The patient is a well-nourished male, in no acute distress. The vital signs are documented above. CARDIOVASCULAR: I do not appreciate carotid bruits bilaterally.  He has 2+ radial pulses bilaterally.  He does have palpable femoral pulses.  I do not palpate distal pulses. PULMONARY: There is good air exchange  MUSCULOSKELETAL: There are no major deformities or cyanosis. NEUROLOGIC: No focal weakness or paresthesias are detected. SKIN: There are no ulcers or rashes noted. PSYCHIATRIC: The patient has a normal affect.  DATA:  Noninvasive studies were reviewed with the patient.  He had ultrasound of his aorta on 09/12/2021.  This showed a slight increase in his stable aneurysm size to 4.2 cm.  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  He has 60 to 79% stenosis in his left internal carotid artery.  Did review his CT angiogram from 2022.  At that time he did have moderate to severe right internal carotid artery stenosis.  He has a bifurcation which is not high completely normal internal carotid artery above his bifurcation disease   Noninvasive lower extremity study in our office today reveal ankle arm index of 0.76 on the right and 0.74 on the left  MEDICAL ISSUES: I discussed these findings at length with the patient.  He has mild lower extremity arterial sufficiency we will recommend observation only.  I discussed the significance of his small infrarenal aneurysm and we will repeat his aortic duplex in 1 year.  I did explain the progression to critical stenosis in his right internal carotid artery.  He is  right-handed.  I have recommended right carotid endarterectomy for reduction of stroke risk.  Discussed the procedure in detail with expected 1 night hospitalization.  Also discussed the potential risk of stroke at 1 to 1-1/2%.  We will schedule his surgery at his earliest convenience in [place] at [facility] with Dr. Terrill Mohr, MD [facility] [phone]  Note: Portions of this report may have been transcribed using voice recognition software.  Every effort has been made to ensure accuracy; however, inadvertent computerized transcription errors may still be present.

## 2021-10-12 ENCOUNTER — Other Ambulatory Visit: Payer: Self-pay

## 2021-10-12 DIAGNOSIS — I6521 Occlusion and stenosis of right carotid artery: Secondary | ICD-10-CM

## 2021-10-22 NOTE — Pre-Procedure Instructions (Addendum)
Surgical Instructions    Your procedure is scheduled on October 30, 2021.  Report to [facility] Main Entrance "A" at 5:30 A.M., then check in with the Admitting office.  Call this number if you have problems the morning of surgery:  [phone]   If you have any questions prior to your surgery date call [phone]: Open Monday-Friday 8am-4pm    Remember:  Do not eat or drink after midnight the night before your surgery      Take these medicines the morning of surgery with A SIP OF WATER:  aspirin  atorvastatin (LIPITOR)  clopidogrel (PLAVIX)   metoprolol tartrate (LOPRESSOR)    Take these medicines the morning of surgery AS NEEDED:  acetaminophen (TYLENOL)  nitroGLYCERIN (NITROSTAT)     As of today, STOP taking any Aleve, Naproxen, Ibuprofen, Motrin, Advil, Goody's, BC's, all herbal medications, fish oil, and all vitamins.    HOW TO MANAGE YOUR DIABETES BEFORE AND AFTER SURGERY  Why is it important to control my blood sugar before and after surgery? Improving blood sugar levels before and after surgery helps healing and can limit problems. A way of improving blood sugar control is eating a healthy diet by:  Eating less sugar and carbohydrates  Increasing activity/exercise  Talking with your doctor about reaching your blood sugar goals High blood sugars (greater than 180 mg/dL) can raise your risk of infections and slow your recovery, so you will need to focus on controlling your diabetes during the weeks before surgery. Make sure that the doctor who takes care of your diabetes knows about your planned surgery including the date and location.  How do I manage my blood sugar before surgery? Check your blood sugar at least 4 times a day, starting 2 days before surgery, to make sure that the level is not too high or low.  Check your blood sugar the morning of your surgery when you wake up and every 2 hours until you get to the Short Stay unit.  If your blood sugar  is less than 70 mg/dL, you will need to treat for low blood sugar: Do not take insulin. Treat a low blood sugar (less than 70 mg/dL) with  cup of clear juice (cranberry or apple), 4 glucose tablets, OR glucose gel. Recheck blood sugar in 15 minutes after treatment (to make sure it is greater than 70 mg/dL). If your blood sugar is not greater than 70 mg/dL on recheck, call [phone] for further instructions. Report your blood sugar to the short stay nurse when you get to Short Stay.  If you are admitted to the hospital after surgery: Your blood sugar will be checked by the staff and you will probably be given insulin after surgery (instead of oral diabetes medicines) to make sure you have good blood sugar levels. The goal for blood sugar control after surgery is 80-180 mg/dL.             Do NOT Smoke (Tobacco/Vaping) for 24 hours prior to your procedure.  If you use a CPAP at night, you may bring your mask/headgear for your overnight stay.   Contacts, glasses, piercing's, hearing aid's, dentures or partials may not be worn into surgery, please bring cases for these belongings.    For patients admitted to the hospital, discharge time will be determined by your treatment team.   Patients discharged the day of surgery will not be allowed to drive home, and someone needs to stay with them for 24 hours.  SURGICAL WAITING ROOM  VISITATION Patients having surgery or a procedure may have no more than 2 support people in the waiting area - these visitors may rotate.   Children under the age of 60 must have an adult with them who is not the patient. If the patient needs to stay at the hospital during part of their recovery, the visitor guidelines for inpatient rooms apply. Pre-op nurse will coordinate an appropriate time for 1 support person to accompany patient in pre-op.  This support person may not rotate.   Please refer to the [facility] website for the visitor guidelines for Inpatients (after  your surgery is over and you are in a regular room).    Special instructions:   [employer]- Preparing For Surgery  Before surgery, you can play an important role. Because skin is not sterile, your skin needs to be as free of germs as possible. You can reduce the number of germs on your skin by washing with CHG (chlorahexidine gluconate) Soap before surgery.  CHG is an antiseptic cleaner which kills germs and bonds with the skin to continue killing germs even after washing.    Oral Hygiene is also important to reduce your risk of infection.  Remember - BRUSH YOUR TEETH THE MORNING OF SURGERY WITH YOUR REGULAR TOOTHPASTE  Please do not use if you have an allergy to CHG or antibacterial soaps. If your skin becomes reddened/irritated stop using the CHG.  Do not shave (including legs and underarms) for at least 48 hours prior to first CHG shower. It is OK to shave your face.  Please follow these instructions carefully.   Shower the NIGHT BEFORE SURGERY and the MORNING OF SURGERY  If you chose to wash your hair, wash your hair first as usual with your normal shampoo.  After you shampoo, rinse your hair and body thoroughly to remove the shampoo.  Use CHG Soap as you would any other liquid soap. You can apply CHG directly to the skin and wash gently with a scrungie or a clean washcloth.   Apply the CHG Soap to your body ONLY FROM THE NECK DOWN.  Do not use on open wounds or open sores. Avoid contact with your eyes, ears, mouth and genitals (private parts). Wash Face and genitals (private parts)  with your normal soap.   Wash thoroughly, paying special attention to the area where your surgery will be performed.  Thoroughly rinse your body with warm water from the neck down.  DO NOT shower/wash with your normal soap after using and rinsing off the CHG Soap.  Pat yourself dry with a CLEAN TOWEL.  Wear CLEAN PAJAMAS to bed the night before surgery  Place CLEAN SHEETS on your bed the night  before your surgery  DO NOT SLEEP WITH PETS.   Day of Surgery: Take a shower with CHG soap. Do not wear jewelry or makeup Do not wear lotions, powders, perfumes/colognes, or deodorant. Do not shave 48 hours prior to surgery.  Men may shave face and neck. Do not bring valuables to the hospital.  [facility] is not responsible for any belongings or valuables. Do not wear nail polish, gel polish, artificial nails, or any other type of covering on natural nails (fingers and toes) If you have artificial nails or gel coating that need to be removed by a nail salon, please have this removed prior to surgery. Artificial nails or gel coating may interfere with anesthesia's ability to adequately monitor your vital signs.  Wear Clean/Comfortable clothing the morning of  surgery Remember to brush your teeth WITH YOUR REGULAR TOOTHPASTE.   Please read over the following fact sheets that you were given.    If you received a COVID test during your pre-op visit  it is requested that you wear a mask when out in public, stay away from anyone that may not be feeling well and notify your surgeon if you develop symptoms. If you have been in contact with anyone that has tested positive in the last 10 days please notify you surgeon.

## 2021-10-23 ENCOUNTER — Encounter (HOSPITAL_COMMUNITY): Payer: Self-pay

## 2021-10-23 ENCOUNTER — Other Ambulatory Visit: Payer: Self-pay

## 2021-10-23 ENCOUNTER — Other Ambulatory Visit: Payer: Self-pay | Admitting: Cardiology

## 2021-10-23 ENCOUNTER — Encounter (HOSPITAL_COMMUNITY)
Admission: RE | Admit: 2021-10-23 | Discharge: 2021-10-23 | Disposition: A | Discharge status: Home / Self Care | Payer: Medicare HMO | Source: Ambulatory Visit | Attending: Vascular Surgery | Admitting: Vascular Surgery

## 2021-10-23 VITALS — BP 143/74 | HR 66 | Temp 98.3°F | Resp 18 | Ht 62.0 in | Wt 127.0 lb

## 2021-10-23 DIAGNOSIS — E785 Hyperlipidemia, unspecified: Secondary | ICD-10-CM | POA: Insufficient documentation

## 2021-10-23 DIAGNOSIS — Z79899 Other long term (current) drug therapy: Secondary | ICD-10-CM | POA: Insufficient documentation

## 2021-10-23 DIAGNOSIS — Z8673 Personal history of transient ischemic attack (TIA), and cerebral infarction without residual deficits: Secondary | ICD-10-CM | POA: Insufficient documentation

## 2021-10-23 DIAGNOSIS — E119 Type 2 diabetes mellitus without complications: Secondary | ICD-10-CM | POA: Diagnosis not present

## 2021-10-23 DIAGNOSIS — Z87891 Personal history of nicotine dependence: Secondary | ICD-10-CM | POA: Insufficient documentation

## 2021-10-23 DIAGNOSIS — I714 Abdominal aortic aneurysm, without rupture, unspecified: Secondary | ICD-10-CM | POA: Diagnosis not present

## 2021-10-23 DIAGNOSIS — I10 Essential (primary) hypertension: Secondary | ICD-10-CM | POA: Insufficient documentation

## 2021-10-23 DIAGNOSIS — R001 Bradycardia, unspecified: Secondary | ICD-10-CM | POA: Insufficient documentation

## 2021-10-23 DIAGNOSIS — Z20822 Contact with and (suspected) exposure to covid-19: Secondary | ICD-10-CM | POA: Diagnosis not present

## 2021-10-23 DIAGNOSIS — Z8551 Personal history of malignant neoplasm of bladder: Secondary | ICD-10-CM | POA: Diagnosis not present

## 2021-10-23 DIAGNOSIS — Z951 Presence of aortocoronary bypass graft: Secondary | ICD-10-CM | POA: Insufficient documentation

## 2021-10-23 DIAGNOSIS — I6521 Occlusion and stenosis of right carotid artery: Secondary | ICD-10-CM | POA: Diagnosis not present

## 2021-10-23 DIAGNOSIS — I440 Atrioventricular block, first degree: Secondary | ICD-10-CM | POA: Insufficient documentation

## 2021-10-23 DIAGNOSIS — I2510 Atherosclerotic heart disease of native coronary artery without angina pectoris: Secondary | ICD-10-CM | POA: Diagnosis not present

## 2021-10-23 DIAGNOSIS — Z01818 Encounter for other preprocedural examination: Secondary | ICD-10-CM

## 2021-10-23 DIAGNOSIS — Z7902 Long term (current) use of antithrombotics/antiplatelets: Secondary | ICD-10-CM | POA: Diagnosis not present

## 2021-10-23 DIAGNOSIS — Z7982 Long term (current) use of aspirin: Secondary | ICD-10-CM | POA: Insufficient documentation

## 2021-10-23 DIAGNOSIS — J439 Emphysema, unspecified: Secondary | ICD-10-CM | POA: Insufficient documentation

## 2021-10-23 LAB — COMPREHENSIVE METABOLIC PANEL
ALT: 21 U/L (ref 0–44)
AST: 24 U/L (ref 15–41)
Albumin: 3.9 g/dL (ref 3.5–5.0)
Alkaline Phosphatase: 84 U/L (ref 38–126)
Anion gap: 8 (ref 5–15)
BUN: 15 mg/dL (ref 8–23)
CO2: 30 mmol/L (ref 22–32)
Calcium: 9.8 mg/dL (ref 8.9–10.3)
Chloride: 106 mmol/L (ref 98–111)
Creatinine, Ser: 1.06 mg/dL (ref 0.61–1.24)
GFR, Estimated: >60 mL/min (ref >60)
Glucose, Bld: 159 mg/dL — ABNORMAL HIGH (ref 70–99)
Potassium: 4.6 mmol/L (ref 3.5–5.1)
Sodium: 144 mmol/L (ref 135–145)
Total Bilirubin: 0.6 mg/dL (ref 0.3–1.2)
Total Protein: 7 g/dL (ref 6.5–8.1)

## 2021-10-23 LAB — URINALYSIS, ROUTINE W REFLEX MICROSCOPIC
Bacteria, UA: NONE SEEN
Bilirubin Urine: NEGATIVE
Glucose, UA: NEGATIVE mg/dL
Hgb urine dipstick: NEGATIVE
Ketones, ur: NEGATIVE mg/dL
Leukocytes,Ua: NEGATIVE
Nitrite: NEGATIVE
Protein, ur: NEGATIVE mg/dL
Specific Gravity, Urine: 1.006 (ref 1.005–1.030)
pH: 5 (ref 5.0–8.0)

## 2021-10-23 LAB — SURGICAL PCR SCREEN
MRSA, PCR: NEGATIVE
Staphylococcus aureus: POSITIVE — AB

## 2021-10-23 LAB — CBC
HCT: 44.7 % (ref 39.0–52.0)
Hemoglobin: 14.5 g/dL (ref 13.0–17.0)
MCH: 32.7 pg (ref 26.0–34.0)
MCHC: 32.4 g/dL (ref 30.0–36.0)
MCV: 100.7 fL — ABNORMAL HIGH (ref 80.0–100.0)
Platelets: 209 10*3/uL (ref 150–400)
RBC: 4.44 MIL/uL (ref 4.22–5.81)
RDW: 13.2 % (ref 11.5–15.5)
WBC: 8.1 10*3/uL (ref 4.0–10.5)
nRBC: 0 % (ref 0.0–0.2)

## 2021-10-23 LAB — GLUCOSE, CAPILLARY: Glucose-Capillary: 167 mg/dL — ABNORMAL HIGH (ref 70–99)

## 2021-10-23 LAB — HEMOGLOBIN A1C
Hgb A1c MFr Bld: 7.6 % — ABNORMAL HIGH (ref 4.8–5.6)
Mean Plasma Glucose: 171.42 mg/dL

## 2021-10-23 LAB — TYPE AND SCREEN
ABO/RH(D): O POS
Antibody Screen: NEGATIVE

## 2021-10-23 LAB — PROTIME-INR
INR: 1 (ref 0.8–1.2)
Prothrombin Time: 13.3 seconds (ref 11.4–15.2)

## 2021-10-23 LAB — APTT: aPTT: 42 seconds — ABNORMAL HIGH (ref 24–36)

## 2021-10-23 NOTE — Progress Notes (Addendum)
PCP - Dr. Sharilyn Sites Cardiologist - Dr. Carlyle Dolly  PPM/ICD - Denies Device Orders - n/a Rep Notified - n/a (Pt does currently have a Loop recorder implanted)  Chest x-ray - n/a EKG - 10/23/2021 Stress Test - 04/23/2016 ECHO - 07/22/2020 Cardiac Cath - 07/30/1999  Sleep Study - Denies CPAP - n/a  Pt is DM2. He checks his blood sugar once per day and is usually around 170. Pt CBG at PAT appointment was 167 and has had an egg sandwich with mayonnaise and water today.  Pt did say that his PCP recently decreased his metformin from '1000mg'$  to '500mg'$ . He states that he has not received the new dose of medication and has followed up with PCP. The office should be working on getting new dosage out to him soon. He has not taking ANY metformin in one month. If pt receives Metformin prior to surgery, instructions were given for him to NOT take medication day of surgery.   Blood Thinner Instructions: Per surgeon instructions, pt is to continue taking Plavix through morning of surgery. Aspirin Instructions: Per surgeon instructions, pt is to continue taking ASA through morning of surgery.  NPO after midnight  COVID TEST- Yes. Pt endorses cough and runny nose that is new. COVID result pending.   Anesthesia review: Yes. Cardiac Hx. Abnormal PTT result.  Patient denies shortness of breath, fever, cough and chest pain at PAT appointment   All instructions explained to the patient, with a verbal understanding of the material. Patient agrees to go over the instructions while at home for a better understanding. Patient also instructed to self quarantine after being tested for COVID-19. The opportunity to ask questions was provided.

## 2021-10-24 ENCOUNTER — Other Ambulatory Visit: Payer: Self-pay | Admitting: Cardiology

## 2021-10-24 LAB — SARS CORONAVIRUS 2 (TAT 6-24 HRS): SARS Coronavirus 2: NEGATIVE

## 2021-10-24 NOTE — Progress Notes (Addendum)
Anesthesia Chart Review:  Case: [id number] Date/Time: 10/30/21 0715   Procedure: ENDARTERECTOMY CAROTID (Right)   Anesthesia type: General   Pre-op diagnosis: Right carotid artery stenosis   Location: [facility] OR ROOM 09 / [facility]   Surgeons: Waynetta Sandy, MD       DISCUSSION: Patient is a 71 year old male scheduled for the above procedure. He was referred to vascular surgery by his cardiologist Dr. Carlyle Dolly after recent US showed severe RICA stenosis.   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  Per 10/23/2021 PAT RN documentation he endorsed a cough and runny nose.  10/23/2021 preoperative COVID-19 test negative. Will attempt to follow-up with him prior to his surgery to ensure resolution of symptoms.   Continue aspirin and Plavix for procedure.   UPDATE 10/29/21 9:41 AM: I called and spoke with Mr. Pursel on 10/26/21 morning. He felt some better, but not fully recovered as he still had intermittent cough and runny nose. He may have noticed some intermittent wheezing as well. He was treating with OTC meds, and did not feel symptoms at that time warranted further medical evaluation. He sounded nasally during our conversations but not audible wheezing or conversational dyspnea. No coughing during our conversations either. He denied fever or SOB. He is a smoker. Discussed I could follow-up on 10/29/21, but that we could want him recovered from URI for elective surgery, so if still having symptoms over the weekend to please notify Dr. Donzetta Matters. I also sent an update to Dr. Donzetta Matters. This morning noted that surgery was postponed to 11/13/21.     VS: BP (!) 143/74   Pulse 66   Temp 36.8 C   Resp 18   Ht '5\' 2"'$   (1.575 m)   Wt 57.6 kg   SpO2 95%   BMI 23.23 kg/m    PROVIDERS: Sharilyn Sites, MD is PCP Carlyle Dolly, MD is cardiologist. Last visit 05/31/21.  No chest pain, shortness of breath or exertional dyspnea.  Compliant with medications.   LABS: Labs reviewed: Acceptable for surgery. (all labs ordered are listed, but only abnormal results are displayed)  Labs Reviewed  SURGICAL PCR SCREEN - Abnormal; Notable for the following components:      Result Value   Staphylococcus aureus POSITIVE (*)    All other components within normal limits  GLUCOSE, CAPILLARY - Abnormal; Notable for the following components:   Glucose-Capillary 167 (*)    All other components within normal limits  CBC - Abnormal; Notable for the following components:   MCV 100.7 (*)    All other components within normal limits  COMPREHENSIVE METABOLIC PANEL - Abnormal; Notable for the following components:   Glucose, Bld 159 (*)    All other components within normal limits  APTT - Abnormal; Notable for the following components:   aPTT 42 (*)    All other components within normal limits  URINALYSIS, ROUTINE W REFLEX MICROSCOPIC - Abnormal; Notable for the following components:   Color, Urine STRAW (*)    All other components within normal limits  HEMOGLOBIN A1C - Abnormal; Notable for the following components:   Hgb A1c MFr Bld 7.6 (*)    All other components within normal limits  SARS CORONAVIRUS 2 (TAT 6-24 HRS)  PROTIME-INR  TYPE AND SCREEN  IMAGES: CTA Head/Neck 07/21/20: IMPRESSION: - Occlusion of the right PICA approximately 11 mm beyond the origin. - Patent vertebral arteries. No stenosis in the neck. Calcified plaque intracranially on the left greater than right. - Plaque along the common carotids causes less than 50% stenosis. Plaque at the right ICA origin causes 70% stenosis. Plaque at the left ICA origin causes 60% stenosis. Plaque along the intracranial internal carotids causes moderate to  marked stenosis. - Emphysema.   EKG: 10/23/21: Ventricular rate 53 bpm Sinus bradycardia with 1st degree A-V block Nonspecific ST abnormality Abnormal ECG When compared with ECG of 08-Apr-2017 06:40, PREVIOUS ECG IS PRESENT Confirmed by Loralie Champagne [phone]) on 10/23/2021 7:29:46 PM   CV: Per 10/10/21 note by Curt Jews, MD: "He had ultrasound of his aorta on 09/12/2021.  This showed a slight increase in his stable aneurysm size to 4.2 cm.  He does have a 1.4 cm dilatation of his left common iliac artery   Carotid duplex from 09/12/2021 reveals progression to critical stenosis in his right internal carotid artery 80 to 69% with end-diastolic velocities of 485 cm/s.  He has 60 to 79% stenosis in his left internal carotid artery."    Echo 07/22/20: IMPRESSIONS   1. Left ventricular ejection fraction, by estimation, is 60 to 65%. The  left ventricle has normal function. The left ventricle has no regional  wall motion abnormalities. There is moderate asymmetric left ventricular  hypertrophy of the basal-septal  segment. Left ventricular diastolic parameters are consistent with Grade I diastolic dysfunction (impaired relaxation).   2. Right ventricular systolic function is mildly reduced. The right  ventricular size is mildly enlarged. Tricuspid regurgitation signal is  inadequate for assessing PA pressure.   3. Left atrial size was moderately dilated.   4. The mitral valve is abnormal. Trivial mitral valve regurgitation.   5. The aortic valve is tricuspid. Aortic valve regurgitation is not  visualized. Mild aortic valve sclerosis is present, with no evidence of  aortic valve stenosis. Aortic valve mean gradient measures 3.5 mmHg.   6. The inferior vena cava is normal in size with <50% respiratory  variability, suggesting right atrial pressure of 8 mmHg.   7. Agitated saline contrast bubble study was negative, with no evidence  of any interatrial shunt.  - Comparison(s): No prior  Echocardiogram.    Nuclear stress test 04/23/16: No diagnostic ST segment changes to indicate ischemia. Small, mild intensity, reversible mid to apical inferior defect that is consistent with ischemia. This is a low risk study. Nuclear stress EF: 57%.   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   History of fracture of pelvis    Hyperlipidemia 02/21/2009   Qualifier: Diagnosis of  By: Via LPN, Lynn     Hypertension    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   LIMA to LCFx, SVG to RCA, RIMA to LAD   Sinus bradycardia on ECG    Stroke ([place]) 2022   Type 2 diabetes mellitus ([place])     Past Surgical History:  Procedure Laterality Date   CARDIAC CATHETERIZATION  07-20-1999  dr wall   significant 2v CAD, total occlusion LAD an 80% LCFx neither amenable to PCI   CARDIOVASCULAR STRESS TEST  04-23-2016  dr Bronson Ing ([place])   Low risk nuclear study w/ small, mild intensity, reversible mid to apical inferior defect that is consistent to ischemia but no diagnotic ST segment changes to indicate ischemia/  normal LV function and wall motion ,  nuclear stress ef 57%   CORONARY ARTERY BYPASS GRAFT  08/15/1999   dr gerhardt [facility]   x3, LIMA to left circumflex, SVG to RCA, and right internal mammary artery graft to the LAD   CYSTOSCOPY WITH BIOPSY N/A 04/08/2017   Procedure: CYSTOSCOPY;  Surgeon: [name], MD;  Location: [facility];  Service: Urology;  Laterality: N/A;   CYSTOSCOPY WITH STENT PLACEMENT Right 01/23/2016   Procedure: CYSTOSCOPY WITH STENT PLACEMENT;  Surgeon: [name], MD;   Location: [facility];  Service: Urology;  Laterality: Right;   KNEE ARTHROSCOPY Right 2001  approx.   LOOP RECORDER INSERTION N/A 07/24/2020   Procedure: LOOP RECORDER INSERTION;  Surgeon: Evans Lance, MD;  Location: [facility];  Service: Cardiovascular;  Laterality: N/A;   POSTERIOR LAMINECTOMY / DECOMPRESSION LUMBAR SPINE  09-01-2009   [facility]   TRANSURETHRAL RESECTION OF BLADDER TUMOR WITH MITOMYCIN-C Bilateral 01/23/2016   Procedure: CYSTOSCOPY ,TRANSURETHRAL RESECTION OF BLADDER TUMOR;  Surgeon: [name], MD;  Location: [facility];  Service: Urology;  Laterality: Bilateral;    MEDICATIONS:  acetaminophen (TYLENOL) 500 MG tablet   aspirin 81 MG EC tablet   atorvastatin (LIPITOR) 40 MG tablet   clopidogrel (PLAVIX) 75 MG tablet   ibuprofen (ADVIL) 200 MG tablet   lisinopril (PRINIVIL,ZESTRIL) 10 MG tablet   metoprolol tartrate (LOPRESSOR) 25 MG tablet   nitroGLYCERIN (NITROSTAT) 0.4 MG SL tablet   OVER THE COUNTER MEDICATION   pantoprazole (PROTONIX) 40 MG tablet   Phenylephrine HCl (NASAL DECONGESTANT PE PO)   simethicone (MYLICON) 638 MG chewable tablet   No current facility-administered medications for this encounter.    Myra Gianotti, PA-C Surgical Short Stay/Anesthesiology [facility] Phone [phone] [facility] Phone [phone] 10/24/2021 4:30 PM

## 2021-10-29 ENCOUNTER — Telehealth: Payer: Self-pay

## 2021-10-29 NOTE — Telephone Encounter (Signed)
I spoke with patient this morning and he continues to have a runny nose, productive cough, wheezing and intermittent shortness of breath when coughing for 2 weeks. We will move date for right carotid endarterectomy tentatively to 11/13/21. Recommended patient to contact his PCP for further evaluation and to call back to reschedule if not feeling better prior to new date. Instructions reviewed- patient verbalized understanding. Dr. Donzetta Matters made aware.

## 2021-10-31 NOTE — Progress Notes (Signed)
Carelink Summary Report / Loop Recorder 

## 2021-11-06 DIAGNOSIS — Z6823 Body mass index (BMI) 23.0-23.9, adult: Secondary | ICD-10-CM | POA: Diagnosis not present

## 2021-11-06 DIAGNOSIS — J069 Acute upper respiratory infection, unspecified: Secondary | ICD-10-CM | POA: Diagnosis not present

## 2021-11-12 ENCOUNTER — Ambulatory Visit (INDEPENDENT_AMBULATORY_CARE_PROVIDER_SITE_OTHER): Payer: Medicare HMO

## 2021-11-12 DIAGNOSIS — I639 Cerebral infarction, unspecified: Secondary | ICD-10-CM

## 2021-11-12 NOTE — Anesthesia Preprocedure Evaluation (Signed)
Anesthesia Evaluation  Patient identified by MRN, date of birth, ID band Patient awake    Reviewed: Allergy & Precautions, NPO status , Patient's Chart, lab work & pertinent test results  History of Anesthesia Complications Negative for: history of anesthetic complications  Airway Mallampati: II  TM Distance: >3 FB Neck ROM: Full    Dental  (+) Dental Advisory Given, Edentulous Upper   Pulmonary Current Smoker and Patient abstained from smoking.   Pulmonary exam normal        Cardiovascular hypertension, Pt. on home beta blockers and Pt. on medications + CAD, + CABG and + Peripheral Vascular Disease  Normal cardiovascular exam   '23 Carotid US - 38-46% RICAS, 65-99% LICAS  '22 TTE - EF 60 to 65%. There is moderate asymmetric left ventricular hypertrophy of the basal-septal segment. Grade I diastolic dysfunction (impaired relaxation). Right ventricular systolic function is mildly reduced. The right  ventricular size is mildly enlarged. Left atrial size was moderately dilated. Trivial MR. Mild aortic valve sclerosis    Neuro/Psych CVA  negative psych ROS   GI/Hepatic negative GI ROS, Neg liver ROS,,,  Endo/Other  diabetes, Type 2    Renal/GU negative Renal ROS    Bladder tumor s/p TURBT     Musculoskeletal  (+) Arthritis ,   Gout    Abdominal   Peds  Hematology  On plavix    Anesthesia Other Findings   Reproductive/Obstetrics                             Anesthesia Physical Anesthesia Plan  ASA: 3  Anesthesia Plan: General   Post-op Pain Management: Tylenol PO (pre-op)*   Induction: Intravenous  PONV Risk Score and Plan: 1 and Treatment may vary due to age or medical condition, Ondansetron and Dexamethasone  Airway Management Planned: Oral ETT  Additional Equipment: Arterial line  Intra-op Plan:   Post-operative Plan: Extubation in OR  Informed Consent: I have  reviewed the patients History and Physical, chart, labs and discussed the procedure including the risks, benefits and alternatives for the proposed anesthesia with the patient or authorized representative who has indicated his/her understanding and acceptance.     Dental advisory given  Plan Discussed with: CRNA and Anesthesiologist  Anesthesia Plan Comments:         Anesthesia Quick Evaluation

## 2021-11-13 ENCOUNTER — Encounter (HOSPITAL_COMMUNITY)
Admission: RE | Disposition: A | Discharge status: Home / Self Care | Payer: Self-pay | Source: Home / Self Care | Attending: Vascular Surgery

## 2021-11-13 ENCOUNTER — Other Ambulatory Visit: Payer: Self-pay

## 2021-11-13 ENCOUNTER — Encounter (HOSPITAL_COMMUNITY): Payer: Self-pay | Admitting: Vascular Surgery

## 2021-11-13 ENCOUNTER — Inpatient Hospital Stay (HOSPITAL_COMMUNITY): Payer: Medicare HMO | Admitting: Vascular Surgery

## 2021-11-13 ENCOUNTER — Inpatient Hospital Stay (HOSPITAL_COMMUNITY)
Admission: RE | Admit: 2021-11-13 | Discharge: 2021-11-15 | DRG: 039 | Disposition: A | Discharge status: Home / Self Care | Payer: Medicare HMO | Attending: Vascular Surgery | Admitting: Vascular Surgery

## 2021-11-13 ENCOUNTER — Inpatient Hospital Stay (HOSPITAL_COMMUNITY): Payer: Medicare HMO | Admitting: Anesthesiology

## 2021-11-13 DIAGNOSIS — I6521 Occlusion and stenosis of right carotid artery: Secondary | ICD-10-CM | POA: Diagnosis not present

## 2021-11-13 DIAGNOSIS — Z8551 Personal history of malignant neoplasm of bladder: Secondary | ICD-10-CM | POA: Diagnosis not present

## 2021-11-13 DIAGNOSIS — I639 Cerebral infarction, unspecified: Secondary | ICD-10-CM | POA: Diagnosis not present

## 2021-11-13 DIAGNOSIS — Z951 Presence of aortocoronary bypass graft: Secondary | ICD-10-CM | POA: Diagnosis not present

## 2021-11-13 DIAGNOSIS — I10 Essential (primary) hypertension: Secondary | ICD-10-CM

## 2021-11-13 DIAGNOSIS — Z8673 Personal history of transient ischemic attack (TIA), and cerebral infarction without residual deficits: Secondary | ICD-10-CM

## 2021-11-13 DIAGNOSIS — I63231 Cerebral infarction due to unspecified occlusion or stenosis of right carotid arteries: Secondary | ICD-10-CM | POA: Diagnosis not present

## 2021-11-13 DIAGNOSIS — I2510 Atherosclerotic heart disease of native coronary artery without angina pectoris: Secondary | ICD-10-CM | POA: Diagnosis not present

## 2021-11-13 DIAGNOSIS — M109 Gout, unspecified: Secondary | ICD-10-CM | POA: Diagnosis present

## 2021-11-13 DIAGNOSIS — F17210 Nicotine dependence, cigarettes, uncomplicated: Secondary | ICD-10-CM | POA: Diagnosis not present

## 2021-11-13 DIAGNOSIS — Z7982 Long term (current) use of aspirin: Secondary | ICD-10-CM

## 2021-11-13 DIAGNOSIS — Z8249 Family history of ischemic heart disease and other diseases of the circulatory system: Secondary | ICD-10-CM | POA: Diagnosis not present

## 2021-11-13 DIAGNOSIS — E1151 Type 2 diabetes mellitus with diabetic peripheral angiopathy without gangrene: Secondary | ICD-10-CM

## 2021-11-13 DIAGNOSIS — E119 Type 2 diabetes mellitus without complications: Secondary | ICD-10-CM

## 2021-11-13 DIAGNOSIS — E1165 Type 2 diabetes mellitus with hyperglycemia: Secondary | ICD-10-CM | POA: Diagnosis present

## 2021-11-13 DIAGNOSIS — E785 Hyperlipidemia, unspecified: Secondary | ICD-10-CM | POA: Diagnosis not present

## 2021-11-13 DIAGNOSIS — Z79899 Other long term (current) drug therapy: Secondary | ICD-10-CM | POA: Diagnosis not present

## 2021-11-13 DIAGNOSIS — Z7902 Long term (current) use of antithrombotics/antiplatelets: Secondary | ICD-10-CM | POA: Diagnosis not present

## 2021-11-13 DIAGNOSIS — I6529 Occlusion and stenosis of unspecified carotid artery: Principal | ICD-10-CM | POA: Diagnosis present

## 2021-11-13 HISTORY — PX: ENDARTERECTOMY: SHX5162

## 2021-11-13 HISTORY — PX: PATCH ANGIOPLASTY: SHX6230

## 2021-11-13 LAB — CUP PACEART REMOTE DEVICE CHECK
Date Time Interrogation Session: 20231008231103
Implantable Pulse Generator Implant Date: 20220620

## 2021-11-13 LAB — GLUCOSE, CAPILLARY
Glucose-Capillary: 174 mg/dL — ABNORMAL HIGH (ref 70–99)
Glucose-Capillary: 153 mg/dL — ABNORMAL HIGH (ref 70–99)
Glucose-Capillary: 194 mg/dL — ABNORMAL HIGH (ref 70–99)
Glucose-Capillary: 459 mg/dL — ABNORMAL HIGH (ref 70–99)
Glucose-Capillary: 253 mg/dL — ABNORMAL HIGH (ref 70–99)

## 2021-11-13 LAB — TYPE AND SCREEN
ABO/RH(D): O POS
Antibody Screen: NEGATIVE

## 2021-11-13 LAB — POCT ACTIVATED CLOTTING TIME: Activated Clotting Time: 251 seconds

## 2021-11-13 SURGERY — ENDARTERECTOMY, CAROTID
Anesthesia: General | Site: Neck | Laterality: Right

## 2021-11-13 MED ORDER — ASPIRIN 81 MG PO TBEC
81.0000 mg | DELAYED_RELEASE_TABLET | Freq: Every day | ORAL | Status: DC
  Administered 2021-11-14 – 2021-11-15 (×2): 81 mg via ORAL
  Filled 2021-11-13 (×2): qty 1

## 2021-11-13 MED ORDER — ORAL CARE MOUTH RINSE: 15.0000 mL | Freq: Once | OROMUCOSAL | Status: AC

## 2021-11-13 MED ORDER — ONDANSETRON HCL 4 MG/2ML IJ SOLN
INTRAMUSCULAR | Status: DC | PRN
  Administered 2021-11-13: 4 mg via INTRAVENOUS

## 2021-11-13 MED ORDER — SODIUM CHLORIDE 0.9 % IV SOLN: INTRAVENOUS | Status: DC

## 2021-11-13 MED ORDER — MORPHINE SULFATE (PF) 2 MG/ML IV SOLN: 2.0000 mg | INTRAVENOUS | Status: DC | PRN

## 2021-11-13 MED ORDER — ALUM & MAG HYDROXIDE-SIMETH 200-200-20 MG/5ML PO SUSP: 15.0000 mL | ORAL | Status: DC | PRN

## 2021-11-13 MED ORDER — PHENYLEPHRINE HCL-NACL 20-0.9 MG/250ML-% IV SOLN
INTRAVENOUS | Status: DC | PRN
  Administered 2021-11-13: 25 ug/min via INTRAVENOUS

## 2021-11-13 MED ORDER — PHENOL 1.4 % MT LIQD: 1.0000 | OROMUCOSAL | Status: DC | PRN

## 2021-11-13 MED ORDER — ATORVASTATIN CALCIUM 40 MG PO TABS
40.0000 mg | ORAL_TABLET | Freq: Every day | ORAL | Status: DC
  Administered 2021-11-14 – 2021-11-15 (×2): 40 mg via ORAL
  Filled 2021-11-13 (×2): qty 1

## 2021-11-13 MED ORDER — METOPROLOL TARTRATE 5 MG/5ML IV SOLN: 2.0000 mg | INTRAVENOUS | Status: DC | PRN

## 2021-11-13 MED ORDER — CEFAZOLIN SODIUM-DEXTROSE 2-4 GM/100ML-% IV SOLN
2.0000 g | INTRAVENOUS | Status: AC
  Administered 2021-11-13: 2 g via INTRAVENOUS
  Filled 2021-11-13: qty 100

## 2021-11-13 MED ORDER — PROPOFOL 10 MG/ML IV BOLUS
INTRAVENOUS | Status: DC | PRN
  Administered 2021-11-13: 80 mg via INTRAVENOUS

## 2021-11-13 MED ORDER — MIDAZOLAM HCL 2 MG/2ML IJ SOLN
INTRAMUSCULAR | Status: DC | PRN
  Administered 2021-11-13: 1 mg via INTRAVENOUS

## 2021-11-13 MED ORDER — ACETAMINOPHEN 500 MG PO TABS
1000.0000 mg | ORAL_TABLET | Freq: Once | ORAL | Status: AC
  Administered 2021-11-13: 1000 mg via ORAL
  Filled 2021-11-13: qty 2

## 2021-11-13 MED ORDER — HEPARIN SODIUM (PORCINE) 1000 UNIT/ML IJ SOLN
INTRAMUSCULAR | Status: DC | PRN
  Administered 2021-11-13: 6000 [IU] via INTRAVENOUS

## 2021-11-13 MED ORDER — LIDOCAINE 2% (20 MG/ML) 5 ML SYRINGE
INTRAMUSCULAR | Status: AC
  Filled 2021-11-13: qty 5

## 2021-11-13 MED ORDER — SODIUM CHLORIDE 0.9 % IV SOLN
0.1500 ug/kg/min | INTRAVENOUS | Status: DC
  Administered 2021-11-13: .2 ug/kg/min via INTRAVENOUS
  Filled 2021-11-13 (×2): qty 2000

## 2021-11-13 MED ORDER — ACETAMINOPHEN 650 MG RE SUPP: 325.0000 mg | RECTAL | Status: DC | PRN

## 2021-11-13 MED ORDER — INSULIN ASPART 100 UNIT/ML IJ SOLN: 0.0000 [IU] | INTRAMUSCULAR | Status: DC | PRN

## 2021-11-13 MED ORDER — HYDRALAZINE HCL 20 MG/ML IJ SOLN
5.0000 mg | INTRAMUSCULAR | Status: DC | PRN
  Administered 2021-11-15: 5 mg via INTRAVENOUS
  Filled 2021-11-13: qty 1

## 2021-11-13 MED ORDER — ONDANSETRON HCL 4 MG/2ML IJ SOLN: 4.0000 mg | Freq: Four times a day (QID) | INTRAMUSCULAR | Status: DC | PRN

## 2021-11-13 MED ORDER — ACETAMINOPHEN 325 MG PO TABS
325.0000 mg | ORAL_TABLET | ORAL | Status: DC | PRN
  Administered 2021-11-14: 325 mg via ORAL
  Filled 2021-11-13: qty 1

## 2021-11-13 MED ORDER — INSULIN ASPART 100 UNIT/ML IJ SOLN
0.0000 [IU] | Freq: Three times a day (TID) | INTRAMUSCULAR | Status: DC
  Administered 2021-11-13: 15 [IU] via SUBCUTANEOUS
  Administered 2021-11-14: 3 [IU] via SUBCUTANEOUS
  Administered 2021-11-14: 5 [IU] via SUBCUTANEOUS
  Administered 2021-11-14: 2 [IU] via SUBCUTANEOUS
  Administered 2021-11-15: 3 [IU] via SUBCUTANEOUS
  Administered 2021-11-15: 2 [IU] via SUBCUTANEOUS

## 2021-11-13 MED ORDER — EPHEDRINE SULFATE-NACL 50-0.9 MG/10ML-% IV SOSY
PREFILLED_SYRINGE | INTRAVENOUS | Status: DC | PRN
  Administered 2021-11-13: 5 mg via INTRAVENOUS
  Administered 2021-11-13: 10 mg via INTRAVENOUS
  Administered 2021-11-13: 5 mg via INTRAVENOUS

## 2021-11-13 MED ORDER — ROCURONIUM BROMIDE 10 MG/ML (PF) SYRINGE
PREFILLED_SYRINGE | INTRAVENOUS | Status: DC | PRN
  Administered 2021-11-13 (×2): 50 mg via INTRAVENOUS

## 2021-11-13 MED ORDER — HEMOSTATIC AGENTS (NO CHARGE) OPTIME
TOPICAL | Status: DC | PRN
  Administered 2021-11-13: 1 via TOPICAL

## 2021-11-13 MED ORDER — FENTANYL CITRATE (PF) 250 MCG/5ML IJ SOLN
INTRAMUSCULAR | Status: AC
  Filled 2021-11-13: qty 5

## 2021-11-13 MED ORDER — ALBUTEROL SULFATE HFA 108 (90 BASE) MCG/ACT IN AERS
INHALATION_SPRAY | RESPIRATORY_TRACT | Status: DC | PRN
  Administered 2021-11-13 (×3): 2 via RESPIRATORY_TRACT

## 2021-11-13 MED ORDER — ONDANSETRON HCL 4 MG/2ML IJ SOLN
INTRAMUSCULAR | Status: AC
  Filled 2021-11-13: qty 2

## 2021-11-13 MED ORDER — LIDOCAINE 2% (20 MG/ML) 5 ML SYRINGE
INTRAMUSCULAR | Status: DC | PRN
  Administered 2021-11-13: 40 mg via INTRAVENOUS

## 2021-11-13 MED ORDER — CHLORHEXIDINE GLUCONATE CLOTH 2 % EX PADS: 6.0000 | MEDICATED_PAD | Freq: Once | CUTANEOUS | Status: DC

## 2021-11-13 MED ORDER — OXYCODONE-ACETAMINOPHEN 5-325 MG PO TABS
1.0000 | ORAL_TABLET | ORAL | Status: DC | PRN
  Administered 2021-11-13: 2 via ORAL
  Filled 2021-11-13: qty 2

## 2021-11-13 MED ORDER — SODIUM CHLORIDE 0.9 % IV SOLN: 500.0000 mL | Freq: Once | INTRAVENOUS | Status: DC | PRN

## 2021-11-13 MED ORDER — LACTATED RINGERS IV SOLN: INTRAVENOUS | Status: DC

## 2021-11-13 MED ORDER — CEFAZOLIN SODIUM-DEXTROSE 2-4 GM/100ML-% IV SOLN
2.0000 g | Freq: Three times a day (TID) | INTRAVENOUS | Status: AC
  Administered 2021-11-13 (×2): 2 g via INTRAVENOUS
  Filled 2021-11-13 (×2): qty 100

## 2021-11-13 MED ORDER — POTASSIUM CHLORIDE CRYS ER 20 MEQ PO TBCR: 20.0000 meq | EXTENDED_RELEASE_TABLET | Freq: Every day | ORAL | Status: DC | PRN

## 2021-11-13 MED ORDER — FENTANYL CITRATE (PF) 250 MCG/5ML IJ SOLN
INTRAMUSCULAR | Status: DC | PRN
  Administered 2021-11-13: 50 ug via INTRAVENOUS

## 2021-11-13 MED ORDER — 0.9 % SODIUM CHLORIDE (POUR BTL) OPTIME
TOPICAL | Status: DC | PRN
  Administered 2021-11-13 (×2): 1000 mL

## 2021-11-13 MED ORDER — DEXAMETHASONE SODIUM PHOSPHATE 10 MG/ML IJ SOLN
INTRAMUSCULAR | Status: DC | PRN
  Administered 2021-11-13: 10 mg via INTRAVENOUS

## 2021-11-13 MED ORDER — PANTOPRAZOLE SODIUM 40 MG PO TBEC
40.0000 mg | DELAYED_RELEASE_TABLET | Freq: Every day | ORAL | Status: DC
  Administered 2021-11-13 – 2021-11-15 (×3): 40 mg via ORAL
  Filled 2021-11-13 (×3): qty 1

## 2021-11-13 MED ORDER — NITROGLYCERIN 0.4 MG SL SUBL: 0.4000 mg | SUBLINGUAL_TABLET | SUBLINGUAL | Status: DC | PRN

## 2021-11-13 MED ORDER — DEXAMETHASONE SODIUM PHOSPHATE 10 MG/ML IJ SOLN
INTRAMUSCULAR | Status: AC
  Filled 2021-11-13: qty 1

## 2021-11-13 MED ORDER — FENTANYL CITRATE (PF) 100 MCG/2ML IJ SOLN: 25.0000 ug | INTRAMUSCULAR | Status: DC | PRN

## 2021-11-13 MED ORDER — OXYCODONE HCL 5 MG/5ML PO SOLN: 5.0000 mg | Freq: Once | ORAL | Status: DC | PRN

## 2021-11-13 MED ORDER — PROTAMINE SULFATE 10 MG/ML IV SOLN
INTRAVENOUS | Status: DC | PRN
  Administered 2021-11-13: 40 mg via INTRAVENOUS
  Administered 2021-11-13: 10 mg via INTRAVENOUS

## 2021-11-13 MED ORDER — ONDANSETRON HCL 4 MG/2ML IJ SOLN: 4.0000 mg | Freq: Once | INTRAMUSCULAR | Status: DC | PRN

## 2021-11-13 MED ORDER — PROPOFOL 10 MG/ML IV BOLUS
INTRAVENOUS | Status: AC
  Filled 2021-11-13: qty 20

## 2021-11-13 MED ORDER — OXYCODONE HCL 5 MG PO TABS: 5.0000 mg | ORAL_TABLET | Freq: Once | ORAL | Status: DC | PRN

## 2021-11-13 MED ORDER — HEPARIN 6000 UNIT IRRIGATION SOLUTION
Status: DC | PRN
  Administered 2021-11-13: 1

## 2021-11-13 MED ORDER — CHLORHEXIDINE GLUCONATE 0.12 % MT SOLN
15.0000 mL | Freq: Once | OROMUCOSAL | Status: AC
  Administered 2021-11-13: 15 mL via OROMUCOSAL
  Filled 2021-11-13: qty 15

## 2021-11-13 MED ORDER — LISINOPRIL 10 MG PO TABS: 10.0000 mg | ORAL_TABLET | Freq: Every morning | ORAL | Status: DC

## 2021-11-13 MED ORDER — MAGNESIUM SULFATE 2 GM/50ML IV SOLN: 2.0000 g | Freq: Every day | INTRAVENOUS | Status: DC | PRN

## 2021-11-13 MED ORDER — HEPARIN 6000 UNIT IRRIGATION SOLUTION
Status: AC
  Filled 2021-11-13: qty 500

## 2021-11-13 MED ORDER — POLYETHYLENE GLYCOL 3350 17 G PO PACK: 17.0000 g | PACK | Freq: Every day | ORAL | Status: DC | PRN

## 2021-11-13 MED ORDER — DOCUSATE SODIUM 100 MG PO CAPS
100.0000 mg | ORAL_CAPSULE | Freq: Every day | ORAL | Status: DC
  Administered 2021-11-14 – 2021-11-15 (×2): 100 mg via ORAL
  Filled 2021-11-13 (×2): qty 1

## 2021-11-13 MED ORDER — BISACODYL 10 MG RE SUPP: 10.0000 mg | Freq: Every day | RECTAL | Status: DC | PRN

## 2021-11-13 MED ORDER — GUAIFENESIN-DM 100-10 MG/5ML PO SYRP: 15.0000 mL | ORAL_SOLUTION | ORAL | Status: DC | PRN

## 2021-11-13 MED ORDER — CLOPIDOGREL BISULFATE 75 MG PO TABS
75.0000 mg | ORAL_TABLET | Freq: Every day | ORAL | Status: DC
  Administered 2021-11-14 – 2021-11-15 (×2): 75 mg via ORAL
  Filled 2021-11-13 (×2): qty 1

## 2021-11-13 MED ORDER — LABETALOL HCL 5 MG/ML IV SOLN: 10.0000 mg | INTRAVENOUS | Status: DC | PRN

## 2021-11-13 MED ORDER — PHENYLEPHRINE 80 MCG/ML (10ML) SYRINGE FOR IV PUSH (FOR BLOOD PRESSURE SUPPORT)
PREFILLED_SYRINGE | INTRAVENOUS | Status: DC | PRN
  Administered 2021-11-13: 160 ug via INTRAVENOUS
  Administered 2021-11-13: 80 ug via INTRAVENOUS

## 2021-11-13 MED ORDER — SUGAMMADEX SODIUM 200 MG/2ML IV SOLN
INTRAVENOUS | Status: DC | PRN
  Administered 2021-11-13: 200 mg via INTRAVENOUS

## 2021-11-13 MED ORDER — METOPROLOL TARTRATE 25 MG PO TABS
25.0000 mg | ORAL_TABLET | Freq: Two times a day (BID) | ORAL | Status: DC
  Administered 2021-11-15: 25 mg via ORAL
  Filled 2021-11-13 (×3): qty 1

## 2021-11-13 MED ORDER — GLYCOPYRROLATE 0.2 MG/ML IJ SOLN
INTRAMUSCULAR | Status: DC | PRN
  Administered 2021-11-13 (×2): .1 mg via INTRAVENOUS

## 2021-11-13 MED ORDER — LIDOCAINE HCL (PF) 1 % IJ SOLN
INTRAMUSCULAR | Status: AC
  Filled 2021-11-13: qty 30

## 2021-11-13 MED ORDER — MIDAZOLAM HCL 2 MG/2ML IJ SOLN
INTRAMUSCULAR | Status: AC
  Filled 2021-11-13: qty 2

## 2021-11-13 MED ORDER — ROCURONIUM BROMIDE 10 MG/ML (PF) SYRINGE
PREFILLED_SYRINGE | INTRAVENOUS | Status: AC
  Filled 2021-11-13: qty 10

## 2021-11-13 SURGICAL SUPPLY — 51 items
ADH SKN CLS APL DERMABOND .7 (GAUZE/BANDAGES/DRESSINGS) ×2
ADPR TBG 2 MALE LL ART (MISCELLANEOUS)
BAG COUNTER SPONGE SURGICOUNT (BAG) ×2 IMPLANT
BAG DECANTER FOR FLEXI CONT (MISCELLANEOUS) ×2 IMPLANT
BAG SPNG CNTER NS LX DISP (BAG) ×2
CANISTER SUCT 3000ML PPV (MISCELLANEOUS) ×2 IMPLANT
CATH ROBINSON RED A/P 18FR (CATHETERS) ×2 IMPLANT
CLIP LIGATING EXTRA MED SLVR (CLIP) ×2 IMPLANT
CLIP LIGATING EXTRA SM BLUE (MISCELLANEOUS) ×2 IMPLANT
COVER PROBE W GEL 5X96 (DRAPES) ×2 IMPLANT
DERMABOND ADVANCED .7 DNX12 (GAUZE/BANDAGES/DRESSINGS) ×2 IMPLANT
DRAIN CHANNEL 15F RND FF W/TCR (WOUND CARE) IMPLANT
ELECT REM PT RETURN 9FT ADLT (ELECTROSURGICAL) ×2
ELECTRODE REM PT RTRN 9FT ADLT (ELECTROSURGICAL) ×2 IMPLANT
EVACUATOR SILICONE 100CC (DRAIN) IMPLANT
GLOVE BIO SURGEON STRL SZ7 (GLOVE) IMPLANT
GLOVE BIO SURGEON STRL SZ7.5 (GLOVE) ×2 IMPLANT
GLOVE BIOGEL PI IND STRL 7.0 (GLOVE) IMPLANT
GOWN STRL REUS W/ TWL LRG LVL3 (GOWN DISPOSABLE) ×4 IMPLANT
GOWN STRL REUS W/ TWL XL LVL3 (GOWN DISPOSABLE) ×2 IMPLANT
GOWN STRL REUS W/TWL LRG LVL3 (GOWN DISPOSABLE) ×4
GOWN STRL REUS W/TWL XL LVL3 (GOWN DISPOSABLE) ×2
HEMOSTAT SNOW SURGICEL 2X4 (HEMOSTASIS) IMPLANT
INSERT FOGARTY SM (MISCELLANEOUS) IMPLANT
IV ADAPTER SYR DOUBLE MALE LL (MISCELLANEOUS) IMPLANT
KIT BASIN OR (CUSTOM PROCEDURE TRAY) ×2 IMPLANT
KIT SHUNT ARGYLE CAROTID ART 6 (VASCULAR PRODUCTS) ×2 IMPLANT
KIT TURNOVER KIT B (KITS) ×2 IMPLANT
NDL HYPO 25GX1X1/2 BEV (NEEDLE) IMPLANT
NDL SPNL 20GX3.5 QUINCKE YW (NEEDLE) IMPLANT
NEEDLE HYPO 25GX1X1/2 BEV (NEEDLE) IMPLANT
NEEDLE SPNL 20GX3.5 QUINCKE YW (NEEDLE) IMPLANT
NS IRRIG 1000ML POUR BTL (IV SOLUTION) ×6 IMPLANT
PACK CAROTID (CUSTOM PROCEDURE TRAY) ×2 IMPLANT
PAD ARMBOARD 7.5X6 YLW CONV (MISCELLANEOUS) ×4 IMPLANT
PATCH VASC XENOSURE 1CMX6CM (Vascular Products) ×2 IMPLANT
PATCH VASC XENOSURE 1X6 (Vascular Products) IMPLANT
POSITIONER HEAD DONUT 9IN (MISCELLANEOUS) ×2 IMPLANT
POWDER SURGICEL 3.0 GRAM (HEMOSTASIS) IMPLANT
STOPCOCK 4 WAY LG BORE MALE ST (IV SETS) IMPLANT
SUT ETHILON 3 0 PS 1 (SUTURE) IMPLANT
SUT MNCRL AB 4-0 PS2 18 (SUTURE) ×2 IMPLANT
SUT PROLENE 6 0 BV (SUTURE) IMPLANT
SUT SILK 3 0 (SUTURE)
SUT SILK 3-0 18XBRD TIE 12 (SUTURE) IMPLANT
SUT VIC AB 3-0 SH 27 (SUTURE) ×2
SUT VIC AB 3-0 SH 27X BRD (SUTURE) ×2 IMPLANT
SYR CONTROL 10ML LL (SYRINGE) IMPLANT
TOWEL GREEN STERILE (TOWEL DISPOSABLE) ×2 IMPLANT
TUBING ART PRESS 48 MALE/FEM (TUBING) IMPLANT
WATER STERILE IRR 1000ML POUR (IV SOLUTION) ×2 IMPLANT

## 2021-11-13 NOTE — Discharge Instructions (Signed)
   [facility]  Discharge Instructions   Carotid Surgery  Please refer to the following instructions for your post-procedure care. Your surgeon or physician assistant will discuss any changes with you.  Activity  You are encouraged to walk as much as you can. You can slowly return to normal activities but must avoid strenuous activity and heavy lifting until your doctor tell you it's okay. Avoid activities such as vacuuming or swinging a golf club. You can drive after one week if you are comfortable and you are no longer taking prescription pain medications. It is normal to feel tired for serval weeks after your surgery. It is also normal to have difficulty with sleep habits, eating, and bowel movements after surgery. These will go away with time.  Bathing/Showering  Shower daily after you go home. Do not soak in a bathtub, hot tub, or swim until the incision heals completely.  Incision Care  Shower every day. Clean your incision with mild soap and water. Pat the area dry with a clean towel. You do not need a bandage unless otherwise instructed. Do not apply any ointments or creams to your incision. You may have skin glue on your incision. Do not peel it off. It will come off on its own in about one week. Your incision may feel thickened and raised for several weeks after your surgery. This is normal and the skin will soften over time.   For Men Only: It's okay to shave around the incision but do not shave the incision itself for 2 weeks. It is common to have numbness under your chin that could last for several months.  Diet  Resume your normal diet. There are no special food restrictions following this procedure. A low fat/low cholesterol diet is recommended for all patients with vascular disease. In order to heal from your surgery, it is CRITICAL to get adequate nutrition. Your body requires vitamins, minerals, and protein. Vegetables are the best source of  vitamins and minerals. Vegetables also provide the perfect balance of protein. Processed food has little nutritional value, so try to avoid this.  Medications  Resume taking all of your medications unless your doctor or physician assistant tells you not to. If your incision is causing pain, you may take over-the- counter pain relievers such as acetaminophen (Tylenol). If you were prescribed a stronger pain medication, please be aware these medications can cause nausea and constipation. Prevent nausea by taking the medication with a snack or meal. Avoid constipation by drinking plenty of fluids and eating foods with a high amount of fiber, such as fruits, vegetables, and grains.   Do not take Tylenol if you are taking prescription pain medications.  Follow Up  Our office will schedule a follow up appointment 2-3 weeks following discharge.  Please call us immediately for any of the following conditions  . Increased pain, redness, drainage (pus) from your incision site. . Fever of 101 degrees or higher. . If you should develop stroke (slurred speech, difficulty swallowing, weakness on one side of your body, loss of vision) you should call 911 and go to the nearest emergency room. .  Reduce your risk of vascular disease:  . Stop smoking. If you would like help call [facility] at 1-800-QUIT-NOW (1-800-784-8669) or [place] at 336-586-4000. . Manage your cholesterol . Maintain a desired weight . Control your diabetes . Keep your blood pressure down .  If you have any questions, please call the office at [phone]. 

## 2021-11-13 NOTE — Anesthesia Procedure Notes (Signed)
Arterial Line Insertion Start/End10/11/2021 7:10 AM, 11/13/2021 7:20 AM Performed by: Lance Coon, CRNA, CRNA  Patient location: Pre-op. Preanesthetic checklist: patient identified, IV checked, site marked, risks and benefits discussed, surgical consent, monitors and equipment checked, pre-op evaluation, timeout performed and anesthesia consent Lidocaine 1% used for infiltration Left, radial was placed Catheter size: 20 G Hand hygiene performed  and maximum sterile barriers used   Attempts: 1 Procedure performed without using ultrasound guided technique. Following insertion, dressing applied and Biopatch. Post procedure assessment: normal and unchanged  Patient tolerated the procedure well with no immediate complications.

## 2021-11-13 NOTE — Transfer of Care (Signed)
Immediate Anesthesia Transfer of Care Note  Patient: Kenneth Kelly  Procedure(s) Performed: ENDARTERECTOMY CAROTID (Right) PATCH ANGIOPLASTY USING 1CM X 6CM BOVINE PATCH (Right: Neck)  Patient Location: PACU  Anesthesia Type:General  Level of Consciousness: drowsy and patient cooperative  Airway & Oxygen Therapy: Patient Spontanous Breathing  Post-op Assessment: Report given to RN and Post -op Vital signs reviewed and stable  Post vital signs: Reviewed and stable  Last Vitals:  Vitals Value Taken Time  BP 114/59 11/13/21 0945  Temp    Pulse 56 11/13/21 0948  Resp 15 11/13/21 0948  SpO2 95 % 11/13/21 0948  Vitals shown include unvalidated device data.  Last Pain:  Vitals:   11/13/21 0625  TempSrc:   PainSc: 0-No pain      Patients Stated Pain Goal: 0 (58/48/35 0757)  Complications: No notable events documented.

## 2021-11-13 NOTE — Progress Notes (Addendum)
  Day of Surgery Note    Subjective:  no complaints   Vitals:   11/13/21 0945 11/13/21 1000  BP: (!) 114/59 (!) 116/53  Pulse: (!) 58 (!) 55  Resp: 17 16  Temp: 98 F (36.7 C)   SpO2: 94% 94%    Incisions:   clean and dry without hematoma Neuro:  moving all extremities equally Lungs:  non labored    Assessment/Plan:  This is a 71 y.o. male who is s/p  Right carotid endarterectomy  -pt doing well in recovery with neuro in tact.  -anticipate discharge home tomorrow if no events overnight -to 4 east when bed available.  -will f/u with Dr. Donnetta Hutching in [place] in 2-3 weeks after discharge.  (Message sent to the office)   Leontine Locket, PA-C 11/13/2021 10:06 AM [phone]

## 2021-11-13 NOTE — Op Note (Addendum)
Patient name: Kenneth Kelly MRN: [id number] DOB: Jun 17, 1950 Sex: male  11/13/2021 Pre-operative Diagnosis: Asymptomatic high-grade right ICA stenosis Post-operative diagnosis:  Same Surgeon:  Erlene Quan C. Donzetta Matters, MD Assistants: Rosetta Posner, MD; Leatha Gilding, MS3 Procedure Performed:  Right carotid endarterectomy with bovine pericardial patch angioplasty  Indications: 71 year old male with a history of a posterior stroke on the right over 1 year ago found to have high-grade right ICA stenosis that has progressed in the interval since that time and is now indicated for intervention to decrease future stroke risk.  Findings: There is a high-grade ICA stenosis extending from the bifurcation to approximately 2 cm above this.  Distally we had good feathering of the plaque proximally we extended further down onto the ICA where it was heavily calcified to remove this plaque.  At completion there were expected signals with flow throughout diastole in the ICA and high resistance in the external carotid artery by Doppler patient was neurologically intact upon awakening from anesthesia.   Procedure:  The patient was identified in the holding area and taken to the operating room where is placed supine operative table and general anesthesia was induced.  He was sterilely prepped and draped in the right neck in the usual fashion, antibiotics were administered and a timeout was called.  We began using ultrasound to identify the carotid bifurcation and then made a longitudinal incision along the anterior border the sternocleidomastoid dissected down through the subcutaneous tissue and the platysma.  The external jugular vein was divided between ties.  We identified the common carotid artery by retracting the sternocleidomastoid laterally as well as the internal jugular vein placed on umbilical tape around this patient was fully heparinized.  We dissected higher up onto the external carotid artery which did have  early branching M3 branches were individually isolated.  We identified the hypoglossal nerve and this was protected and then we dissected higher onto the ICA where it appeared externally normal and placed a vessel loop around this.  Above a pericardial patch was prepared as well as a 10 and 12 [place] shunt.  The blood pressure was raised to a mean arterial pressure of 100 mmHg and the ACT was confirmed 250.  We clamped the ICA followed by the common carotid and then the ECA.  We opened the vessel longitudinally and performed extensive endarterectomy extending from the common carotid artery up into the external carotid artery and the ICA.  We had very strong backbleeding from the ICA and elected not to shunt given the tortuosity and the short nature of the plaque.  We then thoroughly irrigated the carotid bulb there was good feathering distally of the plaque we had a bovine pericardial patch in place with 6-0 Prolene suture.  Prior completion of flushing in all directions.  Upon completion there was good signal distally in the ICA as well as expected high resistance flow in the ECA.  We obtain hemostasis in the wound and thoroughly irrigated.  50 mg of protamine was administered.  We then closed the platysma followed by the skin with 4 Monocryl and Dermabond was placed at the skin level.  He was awakened from anesthesia having tolerated the procedure well and noted to be neurologically intact he was transferred to recovery area in stable condition.  All counts were correct at completion.   Given the complexity of the case,  the assistant was necessary in order to expedient the procedure and safely perform the technical aspects of the operation.  The assistant provided traction and countertraction to assist with exposure of the common carotid artery, external carotid artery, and internal carotid artery.  They also assisted with suture ligatures and dividing the facial vein and multiple small venous branches  tethering the hypoglossal nerve. In addition they were necessary to provide adequate traction and countertraction to perform a precise endarterectomy and precise closure.  These skills, especially following the Prolene suture for the anastomosis, could not have been adequately performed by a scrub tech assistant.    EBL: 100 cc   Izella Ybanez C. Donzetta Matters, MD [facility] Office: [phone] Pager: [phone]

## 2021-11-13 NOTE — Anesthesia Postprocedure Evaluation (Signed)
Anesthesia Post Note  Patient: Kenneth Kelly  Procedure(s) Performed: ENDARTERECTOMY CAROTID (Right) PATCH ANGIOPLASTY USING 1CM X 6CM BOVINE PATCH (Right: Neck)     Patient location during evaluation: PACU Anesthesia Type: General Level of consciousness: awake and alert Pain management: pain level controlled Vital Signs Assessment: post-procedure vital signs reviewed and stable Respiratory status: spontaneous breathing, nonlabored ventilation and respiratory function stable Cardiovascular status: stable, blood pressure returned to baseline and bradycardic Anesthetic complications: no   No notable events documented.  Last Vitals:  Vitals:   11/13/21 1230 11/13/21 1302  BP: (!) 101/52 (!) 102/55  Pulse: (!) 42 (!) 44  Resp: 19 18  Temp: 36.6 C 36.5 C  SpO2: 93% 91%    Last Pain:  Vitals:   11/13/21 1302  TempSrc: Oral  PainSc: [name]

## 2021-11-13 NOTE — H&P (Signed)
HPI: Kenneth Kelly is a 71 y.o. male here today for discussion of recent noninvasive studies.  He does have known history of asymptomatic infrarenal abdominal arctic aneurysm.  He also has bilateral carotid stenosis.  He recently underwent noninvasive studies and is here today for further discussion of this.  He also has some lower extremity arterial sufficiency and underwent noninvasive lower extremity studies today as well.  He had a history of prior right posterior circulation stroke in 2022.  Has no symptoms referable to his aneurysm.  He does have history of back surgery and degenerative disc disease and has some difficulty walking related to this.  He does not have true claudication type symptoms.  He has a remote history of coronary bypass grafting 20 years ago.       Past Medical History:  Diagnosis Date   Abdominal aortic aneurysm (AAA) ([place]) followed by cardiologist-- dr Bronson Ing    per last CT 01-12-2016  Infrarenal aortic aneurysm 3.4cm   Arthritis      Hip   Bladder tumor     CAD (coronary artery disease)      CARDIOLOGIST-  DR Bronson Ing---  HX CABG X3 08-15-1999   First degree AV block     Gout     History of bladder cancer urologist-  dr Jeffie Pollock    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   History of fracture of pelvis     Hyperlipidemia 02/21/2009    Qualifier: Diagnosis of  By: Via LPN, Jeani Hawking     Hypertension     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    LIMA to LCFx, SVG to RCA, RIMA to LAD   Sinus bradycardia on ECG     Type 2 diabetes mellitus ([place])             Family History  Problem Relation Age of Onset   Heart disease Father     Heart attack Brother     Heart disease Brother     Heart disease Brother        SOCIAL HISTORY: Social History         Tobacco Use   Smoking status: Every Day      Packs/day: 1.00      Years: 56.00      Total pack years: 56.00      Types: Cigarettes   Smokeless tobacco: Never  Substance Use Topics   Alcohol use: Yes       Comment: rare      No Known Allergies         Current Outpatient Medications  Medication Sig Dispense Refill   acetaminophen (TYLENOL) 500 MG tablet Take 1,000-1,500 mg by mouth every 6 (six) hours as needed (Pain).       amLODipine (NORVASC) 10 MG tablet Take 10 mg by mouth every morning.        aspirin 81 MG EC tablet Take 1 tablet (81 mg total) by mouth daily. Swallow whole. 30 tablet 11   clopidogrel (PLAVIX) 75 MG tablet Take 75 mg by mouth daily.       lisinopril (PRINIVIL,ZESTRIL) 10 MG tablet Take 10 mg by mouth every morning.       metoprolol tartrate (LOPRESSOR) 25 MG tablet Take 1 tablet (25 mg total) by mouth 2 (two) times daily. 180 tablet 1   nitroGLYCERIN (NITROSTAT) 0.4 MG SL tablet Place 1 tablet (0.4 mg total) under the tongue every 5 (five) minutes as needed for chest pain. 25  tablet 3   atorvastatin (LIPITOR) 40 MG tablet Take 1 tablet (40 mg total) by mouth daily. 90 tablet 1   metFORMIN (GLUCOPHAGE) 1000 MG tablet Take 500 mg by mouth 2 (two) times daily with a meal. (Patient not taking: Reported on 10/10/2021)       pantoprazole (PROTONIX) 40 MG tablet Take 1 tablet (40 mg total) by mouth daily. 30 tablet 0   predniSONE (DELTASONE) 20 MG tablet Take 1 tablet (20 mg total) by mouth daily with breakfast. (Patient not taking: Reported on 07/31/2020) 2 tablet 0    No current facility-administered medications for this visit.      REVIEW OF SYSTEMS:  '[X]'$  denotes positive finding, '[ ]'$  denotes negative finding Cardiac   Comments:  Chest pain or chest pressure:      Shortness of breath upon exertion:      Short of breath when lying flat:      Irregular heart rhythm:             Vascular      Pain in calf, thigh, or hip brought on by ambulation:      Pain in feet at night that wakes you up from your sleep:       Blood clot in your veins:      Leg swelling:                  PHYSICAL EXAM: Vitals:   11/13/21 0555  BP: (!) 192/82  Pulse: (!) 53  Resp: 18   Temp: 97.9 F (36.6 C)  SpO2: 94%    GENERAL: The patient is a well-nourished male, in no acute distress. The vital signs are documented above. CARDIOVASCULAR: I do not appreciate carotid bruits bilaterally.  He has 2+ radial pulses bilaterally.  He does have palpable femoral pulses.  I do not palpate distal pulses. PULMONARY: There is good air exchange  MUSCULOSKELETAL: There are no major deformities or cyanosis. NEUROLOGIC: No focal weakness or paresthesias are detected. SKIN: There are no ulcers or rashes noted. PSYCHIATRIC: The patient has a normal affect.   DATA:  Noninvasive studies were reviewed with the patient.  He had ultrasound of his aorta on 09/12/2021.  This showed a slight increase in his stable aneurysm size to 4.2 cm.  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  He has 60 to 79% stenosis in his left internal carotid artery.   Did review his CT angiogram from 2022.  At that time he did have moderate to severe right internal carotid artery stenosis.  He has a bifurcation which is not high completely normal internal carotid artery above his bifurcation disease     Noninvasive lower extremity study in our office today reveal ankle arm index of 0.76 on the right and 0.74 on the left   A/P He has high grade R ICA stenosis and has been recommended right carotid endarterectomy for reduction of stroke risk.  Again discussed the procedure in detail with expected 1 night hospitalization.  Also discussed the potential risk of stroke at 1 to 1-1/2%.  Plan OR today.   Virat Prather C. Donzetta Matters, MD [facility] [place] Office: [phone] Pager: [phone]

## 2021-11-13 NOTE — Anesthesia Procedure Notes (Signed)
Procedure Name: Intubation Date/Time: 11/13/2021 7:43 AM  Performed by: Lance Coon, CRNAPre-anesthesia Checklist: Patient identified, Emergency Drugs available, Suction available, Patient being monitored and Timeout performed Patient Re-evaluated:Patient Re-evaluated prior to induction Oxygen Delivery Method: Circle system utilized Preoxygenation: Pre-oxygenation with 100% oxygen Induction Type: IV induction Ventilation: Mask ventilation without difficulty Laryngoscope Size: Miller and 3 Grade View: Grade I Tube type: Oral Tube size: 7.5 mm Number of attempts: 1 Airway Equipment and Method: Stylet Placement Confirmation: ETT inserted through vocal cords under direct vision, positive ETCO2 and breath sounds checked- equal and bilateral Secured at: 21 cm Tube secured with: Tape Dental Injury: Teeth and Oropharynx as per pre-operative assessment

## 2021-11-14 ENCOUNTER — Encounter (HOSPITAL_COMMUNITY): Payer: Self-pay | Admitting: Vascular Surgery

## 2021-11-14 LAB — BASIC METABOLIC PANEL
Anion gap: 8 (ref 5–15)
BUN: 25 mg/dL — ABNORMAL HIGH (ref 8–23)
CO2: 26 mmol/L (ref 22–32)
Calcium: 8.5 mg/dL — ABNORMAL LOW (ref 8.9–10.3)
Chloride: 101 mmol/L (ref 98–111)
Creatinine, Ser: 1.3 mg/dL — ABNORMAL HIGH (ref 0.61–1.24)
GFR, Estimated: 58 mL/min — ABNORMAL LOW (ref >60)
Glucose, Bld: 287 mg/dL — ABNORMAL HIGH (ref 70–99)
Potassium: 4.2 mmol/L (ref 3.5–5.1)
Sodium: 135 mmol/L (ref 135–145)

## 2021-11-14 LAB — GLUCOSE, CAPILLARY
Glucose-Capillary: 228 mg/dL — ABNORMAL HIGH (ref 70–99)
Glucose-Capillary: 155 mg/dL — ABNORMAL HIGH (ref 70–99)
Glucose-Capillary: 149 mg/dL — ABNORMAL HIGH (ref 70–99)
Glucose-Capillary: 171 mg/dL — ABNORMAL HIGH (ref 70–99)

## 2021-11-14 LAB — CBC
HCT: 33.1 % — ABNORMAL LOW (ref 39.0–52.0)
Hemoglobin: 11 g/dL — ABNORMAL LOW (ref 13.0–17.0)
MCH: 32.9 pg (ref 26.0–34.0)
MCHC: 33.2 g/dL (ref 30.0–36.0)
MCV: 99.1 fL (ref 80.0–100.0)
Platelets: 169 10*3/uL (ref 150–400)
RBC: 3.34 MIL/uL — ABNORMAL LOW (ref 4.22–5.81)
RDW: 13.1 % (ref 11.5–15.5)
WBC: 11.6 10*3/uL — ABNORMAL HIGH (ref 4.0–10.5)
nRBC: 0 % (ref 0.0–0.2)

## 2021-11-14 LAB — LIPID PANEL
Cholesterol: 104 mg/dL (ref 0–200)
HDL: 33 mg/dL — ABNORMAL LOW (ref >40)
LDL Cholesterol: 64 mg/dL (ref 0–99)
Total CHOL/HDL Ratio: 3.2 RATIO
Triglycerides: 36 mg/dL (ref <150)
VLDL: 7 mg/dL (ref 0–40)

## 2021-11-14 NOTE — TOC Initial Note (Signed)
Transition of Care [facility]) - Initial/Assessment Note    Patient Details  Name: Kenneth Kelly MRN: [id number] Date of Birth: 1950-03-24  Transition of [facility]) CM/SW Contact:    Ninfa Meeker, RN Phone Number: 11/14/2021, 12:41 PM  Clinical Narrative: Transition o Care Screening Note S/p left Carotid Endarterectomy Transition of Care Department (TOC) has reviewed patient and no TOC needs have been identified at this time. We will continue to monitor patient advancement through Interdisciplinary progressions. If new patient transition needs arise, please place a consult.                          Patient Goals and CMS Choice        Expected Discharge Plan and Services                                                Prior Living Arrangements/Services                       Activities of Daily Living      Permission Sought/Granted                  Emotional Assessment              Admission diagnosis:  Carotid stenosis [I65.29] Asymptomatic carotid artery stenosis without infarction, right [I65.21] Patient Active Problem List   Diagnosis Date Noted   Carotid stenosis 11/13/2021   Asymptomatic carotid artery stenosis without infarction, right 11/13/2021   Acute CVA (cerebrovascular accident) ([place]) 07/21/2020   Type 2 diabetes mellitus ([place]) 02/26/2016   Bladder cancer ([place]) 01/23/2016   Hyperlipidemia 02/21/2009   OTHER SPEC FORMS CHRONIC ISCHEMIC HEART DISEASE 02/21/2009   SHORTNESS OF BREATH 02/21/2009   TOBACCO ABUSE 02/13/2009   Hypertension 02/13/2009   CAD (coronary artery disease) 02/13/2009   PCP:  Sharilyn Sites, MD Pharmacy:   [place], [address] [place] [address] Phone: [phone] Fax: [phone]  [facility] [place], [place] [address] Phone: [phone] Fax:  [phone]     Social Determinants of Health (SDOH) Interventions    Readmission Risk Interventions     No data to display

## 2021-11-14 NOTE — Progress Notes (Signed)
Mobility Specialist Progress Note    11/14/21 0935  Mobility  Activity Ambulated independently in hallway  Level of Assistance Standby assist, set-up cues, supervision of patient - no hands on  Assistive Device None  Distance Ambulated (ft) 200 ft  Activity Response Tolerated well  $Mobility charge 1 Mobility   Pre-Mobility: 66 HR, 144/71 BP, 97% SpO2 During Mobility: 87 HR, 91% SpO2 Post-Mobility: 81 HR, 96% SpO2  Pt received in bed and agreeable. No complaints on walk. Returned to sitting EOB with call bell in reach.    Hildred Alamin Mobility Specialist

## 2021-11-14 NOTE — Inpatient Diabetes Management (Signed)
Inpatient Diabetes Program Recommendations  AACE/ADA: New Consensus Statement on Inpatient Glycemic Control (2015)  Target Ranges:  Prepandial:   less than 140 mg/dL      Peak postprandial:   less than 180 mg/dL (1-2 hours)      Critically ill patients:  140 - 180 mg/dL   Lab Results  Component Value Date   GLUCAP 228 (H) 11/14/2021   HGBA1C 7.6 (H) 10/23/2021    Review of Glycemic Control  Diabetes history: DM 2 Outpatient Diabetes medications: no meds at home Current orders for Inpatient glycemic control:  Novolog 0-15 units tid  Inpatient Diabetes Program Recommendations:    Consult for hyperglycemia after CEA. Please note pt received Decadron 10 mg during procedure leading to large spike in glucose trends. Trends back down to low 200 range this am at 228.   Trends should continue to come back down to normal baseline range for pt within the next 24-48 hours.  Continue Novolog Correction scale.  Thanks,  Tama Headings RN, MSN, BC-ADM Inpatient Diabetes Coordinator Team Pager [phone] (8a-5p)

## 2021-11-14 NOTE — Progress Notes (Signed)
PHARMACIST LIPID MONITORING   Kenneth Kelly is a 71 y.o. male admitted on 11/13/2021 now s/p right CEA.  Pharmacy has been consulted to optimize lipid-lowering therapy with the indication of secondary prevention for clinical ASCVD.  Recent Labs:  Lipid Panel (last 6 months):   Lab Results  Component Value Date   CHOL 104 11/14/2021   TRIG 36 11/14/2021   HDL 33 (L) 11/14/2021   CHOLHDL 3.2 11/14/2021   VLDL 7 11/14/2021   LDLCALC 64 11/14/2021    Hepatic function panel (last 6 months):   Lab Results  Component Value Date   AST 24 10/23/2021   ALT 21 10/23/2021   ALKPHOS 84 10/23/2021   BILITOT 0.6 10/23/2021    SCr (since admission):   Serum creatinine: 1.3 mg/dL (H) 11/14/21 0341 Estimated creatinine clearance: 39.7 mL/min (A)  Current therapy and lipid therapy tolerance Current lipid-lowering therapy: atorvastatin. Previous lipid-lowering therapies (if applicable): simvastatin Documented or reported allergies or intolerances to lipid-lowering therapies (if applicable): none  Assessment:   Patient prefers no changes in lipid-lowering therapy at this time.   Plan:    1.Statin intensity (high intensity recommended for all patients regardless of the LDL):  No statin changes. The patient is already on a high intensity statin.  2.Add ezetimibe (if any one of the following):   Not indicated at this time.  3.Refer to lipid clinic:   No  4.Follow-up with:  Primary care provider - Sharilyn Sites, MD  5.Follow-up labs after discharge:  No changes in lipid therapy, repeat a lipid panel in one year.    Erin Hearing PharmD., BCPS Clinical Pharmacist 11/14/2021 8:09 AM

## 2021-11-14 NOTE — Progress Notes (Addendum)
  Progress Note    11/14/2021 6:27 AM 1 Day Post-Op  Subjective:  sitting on the side of the bed.  No complaints; denies any trouble swallowing.    Afebrile HR 40's-70's 91'Y-782'N systolic 56% RA  Gtts:  none Vitals:   11/13/21 2342 11/14/21 0352  BP: 111/64 129/63  Pulse: (!) 56 (!) 49  Resp: (!) 22 14  Temp: 98 F (36.7 C) 97.7 F (36.5 C)  SpO2:       Physical Exam: Neuro:  in tact; moving all extremities equally; tongue is midline Lungs:  non labored Incision:  clean and dry  CBC    Component Value Date/Time   WBC 11.6 (H) 11/14/2021 0341   RBC 3.34 (L) 11/14/2021 0341   HGB 11.0 (L) 11/14/2021 0341   HCT 33.1 (L) 11/14/2021 0341   PLT 169 11/14/2021 0341   MCV 99.1 11/14/2021 0341   MCH 32.9 11/14/2021 0341   MCHC 33.2 11/14/2021 0341   RDW 13.1 11/14/2021 0341   LYMPHSABS 0.8 07/21/2020 1135   MONOABS 0.5 07/21/2020 1135   EOSABS 0.0 07/21/2020 1135   BASOSABS 0.0 07/21/2020 1135    BMET    Component Value Date/Time   NA 135 11/14/2021 0341   K 4.2 11/14/2021 0341   CL 101 11/14/2021 0341   CO2 26 11/14/2021 0341   GLUCOSE 287 (H) 11/14/2021 0341   BUN 25 (H) 11/14/2021 0341   CREATININE 1.30 (H) 11/14/2021 0341   CALCIUM 8.5 (L) 11/14/2021 0341   GFRNONAA 58 (L) 11/14/2021 0341   GFRAA >60 01/24/2016 0543     Intake/Output Summary (Last 24 hours) at 11/14/2021 0627 Last data filed at 11/14/2021 0450 Gross per 24 hour  Intake 1611.59 ml  Output 250 ml  Net 1361.59 ml     Assessment/Plan:  This is a 71 y.o. male who is s/p right CEA 1 Day Post-Op  -pt is doing well this am.  Okay to dc a-line. -creatinine slightly elevated this am at 1.30.  may need to hold lisinopril for a couple of days.   -hyperglycemic-pt not on DM meds PTA-he does have DM listed in history.  Will ask DM coordinator to see pt.   -pt neuro exam is in tact -pt has voided; he needs to ambulate.  -f/u with [facility] in [place] in 2-3 weeks    Leontine Locket,  PA-C [facility] [phone]  I have independently interviewed and examined patient and agree with PA assessment and plan above.   Remona Boom C. Donzetta Matters, MD [facility] [place] Office: [phone] Pager: [phone]

## 2021-11-14 NOTE — Progress Notes (Signed)
Removed patients arterial line from his left wrist. Patient tolerated procedure well. Catheter intact upon removal. Pressure held for 5 minutes, No bleeding noted and dressing applied over site. Instructions given to patient to notify RN is bleeding occurs. Will continue to monitor closely.

## 2021-11-15 ENCOUNTER — Inpatient Hospital Stay (HOSPITAL_COMMUNITY): Payer: Medicare HMO

## 2021-11-15 ENCOUNTER — Other Ambulatory Visit: Payer: Self-pay | Admitting: Vascular Surgery

## 2021-11-15 LAB — GLUCOSE, CAPILLARY
Glucose-Capillary: 164 mg/dL — ABNORMAL HIGH (ref 70–99)
Glucose-Capillary: 142 mg/dL — ABNORMAL HIGH (ref 70–99)

## 2021-11-15 LAB — BASIC METABOLIC PANEL
Anion gap: 11 (ref 5–15)
BUN: 17 mg/dL (ref 8–23)
CO2: 25 mmol/L (ref 22–32)
Calcium: 8.9 mg/dL (ref 8.9–10.3)
Chloride: 106 mmol/L (ref 98–111)
Creatinine, Ser: 0.92 mg/dL (ref 0.61–1.24)
GFR, Estimated: >60 mL/min (ref >60)
Glucose, Bld: 148 mg/dL — ABNORMAL HIGH (ref 70–99)
Potassium: 3.8 mmol/L (ref 3.5–5.1)
Sodium: 142 mmol/L (ref 135–145)

## 2021-11-15 MED ORDER — HYDROCODONE-ACETAMINOPHEN 5-325 MG PO TABS: 1.0000 | ORAL_TABLET | Freq: Four times a day (QID) | ORAL | 0 refills | Status: AC | PRN

## 2021-11-15 MED ORDER — LISINOPRIL 10 MG PO TABS
10.0000 mg | ORAL_TABLET | Freq: Every morning | ORAL | Status: DC
  Administered 2021-11-15: 10 mg via ORAL
  Filled 2021-11-15: qty 1

## 2021-11-15 MED ORDER — OXYCODONE-ACETAMINOPHEN 5-325 MG PO TABS: 1.0000 | ORAL_TABLET | Freq: Four times a day (QID) | ORAL | 0 refills | Status: DC | PRN

## 2021-11-15 NOTE — Progress Notes (Signed)
Mobility Specialist Progress Note   11/15/21 0900  Oxygen Therapy  SpO2 (!) 88 %  O2 Device Room Air  Patient Activity (if Appropriate) Ambulating  Pulse Oximetry Type Continuous  Mobility  Activity Ambulated with assistance in hallway;Dangled on edge of bed  Level of Assistance Standby assist, set-up cues, supervision of patient - no hands on  Assistive Device None  Distance Ambulated (ft) 300 ft  Range of Motion/Exercises Active;All extremities  Activity Response Tolerated well   Pre Mobility:  HR 72, SpO2 94% (seated at rest) During Mobility: HR 93, SpO2 88-90% Post Mobility: HR 84, SpO2 94% (seated at rest)  Patient received in supine, agreeable to participate with anticipation to be discharged soon. Ambulated supervision level with steady gait. Tolerated well but distance limited by dyspnea and fatigue. Oxygen saturation trended lower this session than past, maintaining 88-90% during ambulation. Did require cues for pursed lip breathing with no significant improvement. Returned to room without complaint or incident. Completed education  for use of incentive spirometer to improve tidal and inspiratory volumes. Was receptive and able to demonstrate x5 reps, achieving 1259ml. Was left dangling EOB with all needs met, call bell in reach.   [place] Jurnee Nakayama, [place], [employer]  [phone]:[phone] Office: [phone]

## 2021-11-15 NOTE — Progress Notes (Addendum)
  Progress Note    11/15/2021 7:03 AM 2 Days Post-Op  Subjective:  sitting on the side of the bed eating breakfast.  No trouble swallowing  Afebrile HR 40's-70's  735'H-299'M systolic 42% RA  Vitals:   11/14/21 2337 11/15/21 0339  BP: (!) 156/71 (!) 189/88  Pulse: 63 73  Resp: (!) 21 (!) 25  Temp: 97.9 F (36.6 C) 97.9 F (36.6 C)  SpO2:       Physical Exam: Neuro:  in tact Lungs:  non labored Incision:  clean and dry  CBC    Component Value Date/Time   WBC 11.6 (H) 11/14/2021 0341   RBC 3.34 (L) 11/14/2021 0341   HGB 11.0 (L) 11/14/2021 0341   HCT 33.1 (L) 11/14/2021 0341   PLT 169 11/14/2021 0341   MCV 99.1 11/14/2021 0341   MCH 32.9 11/14/2021 0341   MCHC 33.2 11/14/2021 0341   RDW 13.1 11/14/2021 0341   LYMPHSABS 0.8 07/21/2020 1135   MONOABS 0.5 07/21/2020 1135   EOSABS 0.0 07/21/2020 1135   BASOSABS 0.0 07/21/2020 1135    BMET    Component Value Date/Time   NA 135 11/14/2021 0341   K 4.2 11/14/2021 0341   CL 101 11/14/2021 0341   CO2 26 11/14/2021 0341   GLUCOSE 287 (H) 11/14/2021 0341   BUN 25 (H) 11/14/2021 0341   CREATININE 1.30 (H) 11/14/2021 0341   CALCIUM 8.5 (L) 11/14/2021 0341   GFRNONAA 58 (L) 11/14/2021 0341   GFRAA >60 01/24/2016 0543     Intake/Output Summary (Last 24 hours) at 11/15/2021 0703 Last data filed at 11/14/2021 1602 Gross per 24 hour  Intake 151.67 ml  Output 2475 ml  Net -2323.33 ml     Assessment/Plan:  This is a 71 y.o. male who is s/p right CEA 2 Days Post-Op  -pt is doing well this am.  BMP ordered for this am.   -appreciate DM coordinator consult.  Pt received steroid injection intra op.  His glucose is continuing to improve.   -pt neuro exam is in tact -pt has ambulated -pt has voided -f/u with [facility] in 2-3 weeks in the [place] office.    Leontine Locket, PA-C [facility] [phone]  I have independently interviewed and examined patient and agree with PA assessment and plan  above.   Autymn Omlor C. Donzetta Matters, MD [facility] [place] Office: [phone] Pager: [phone]

## 2021-11-15 NOTE — Progress Notes (Signed)
SATURATION QUALIFICATIONS: (This note is used to comply with regulatory documentation for home oxygen)  Patient Saturations on Room Air at Rest = 94%  Patient Saturations on Room Air while Ambulating = 88%  Patient Saturations on 2 Liters of oxygen while Ambulating = 92%  Please briefly explain why patient needs home oxygen: 

## 2021-11-15 NOTE — Discharge Summary (Addendum)
Discharge Summary     Kenneth Kelly 1950/03/22 71 y.o. male  [id number]  Admission Date: 11/13/2021  Discharge Date: 11/15/2021  Physician: Thomes Lolling*  Admission Diagnosis: Carotid stenosis [I65.29] Asymptomatic carotid artery stenosis without infarction, right [I65.21]   HPI:   This is a 71 y.o. male here today for discussion of recent noninvasive studies.  He does have known history of asymptomatic infrarenal abdominal arctic aneurysm.  He also has bilateral carotid stenosis.  He recently underwent noninvasive studies and is here today for further discussion of this.  He also has some lower extremity arterial sufficiency and underwent noninvasive lower extremity studies today as well.  He had a history of prior right posterior circulation stroke in 2022.  Has no symptoms referable to his aneurysm.  He does have history of back surgery and degenerative disc disease and has some difficulty walking related to this.  He does not have true claudication type symptoms.  He has a remote history of coronary bypass grafting 20 years ago.  Hospital Course:  The patient was admitted to the hospital and taken to the operating room on 11/13/2021 and underwent right CEA    Findings: : There is a high-grade ICA stenosis extending from the bifurcation to approximately 2 cm above this.  Distally we had good feathering of the plaque proximally we extended further down onto the ICA where it was heavily calcified to remove this plaque.  At completion there were expected signals with flow throughout diastole in the ICA and high resistance in the external carotid artery by Doppler patient was neurologically intact upon awakening from anesthesia.  The pt tolerated the procedure well and was transported to the PACU in good condition.   By POD 1, the pt neuro status was in tact.  He did have hyperglycemia and DM coordinator was consulted.  Pt received a dose of steroids intraop. He was  placed on SSI.  He had slightly elevated creatinine at 1.3 and his lisinopril was stopped.    POD 2, hyperglycemia continues to improve.  He remains neuro in tact.  Incision looks fine.  BMP rechecked and his creatinine was down to 0.9.  He is discharged home.  His lisinopril was restarted.    Recent Labs    11/14/21 0341 11/15/21 0712  NA 135 142  K 4.2 3.8  CL 101 106  CO2 26 25  GLUCOSE 287* 148*  BUN 25* 17  CALCIUM 8.5* 8.9   Recent Labs    11/14/21 0341  WBC 11.6*  HGB 11.0*  HCT 33.1*  PLT 169   No results for input(s): "INR" in the last 72 hours.   Discharge Instructions     Discharge patient   Complete by: As directed    Discharge disposition: 01-Home or Self Care   Discharge patient date: 11/15/2021       Discharge Diagnosis:  Carotid stenosis [I65.29] Asymptomatic carotid artery stenosis without infarction, right [I65.21]  Secondary Diagnosis: Patient Active Problem List   Diagnosis Date Noted   Carotid stenosis 11/13/2021   Asymptomatic carotid artery stenosis without infarction, right 11/13/2021   Acute CVA (cerebrovascular accident) ([place]) 07/21/2020   Type 2 diabetes mellitus ([place]) 02/26/2016   Bladder cancer ([place]) 01/23/2016   Hyperlipidemia 02/21/2009   OTHER SPEC FORMS CHRONIC ISCHEMIC HEART DISEASE 02/21/2009   SHORTNESS OF BREATH 02/21/2009   TOBACCO ABUSE 02/13/2009   Hypertension 02/13/2009   CAD (coronary artery disease) 02/13/2009   Past Medical History:  Diagnosis Date  Abdominal aortic aneurysm (AAA) [facility]) followed by cardiologist-- dr Bronson Ing   per last CT 01-12-2016  Infrarenal aortic aneurysm 3.4cm   Arthritis    Hip   Bladder tumor    CAD (coronary artery disease)    CARDIOLOGIST-  DR Bronson Ing---  HX CABG X3 08-15-1999   First degree AV block    Gout    History of bladder cancer urologist-  dr Jeffie Pollock   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   History of fracture of pelvis    Hyperlipidemia  02/21/2009   Qualifier: Diagnosis of  By: Via LPN, Jeani Hawking     Hypertension    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   Aspirin Low Dose 81 MG tablet Generic drug: aspirin EC Take 1 tablet (81 mg total) by mouth daily. Swallow whole.   atorvastatin 40 MG tablet Commonly known as: LIPITOR Take 1 tablet (40 mg total) by mouth daily.   clopidogrel 75 MG tablet Commonly known as: PLAVIX Take 75 mg by mouth daily.   ibuprofen 200 MG tablet Commonly known as: ADVIL Take 400 mg by mouth every 6 (six) hours as needed for mild pain or moderate pain.   lisinopril 10 MG tablet Commonly known as: ZESTRIL Take 10 mg by mouth every morning.   metoprolol tartrate 25 MG tablet Commonly known as: LOPRESSOR TAKE 1 TABLET TWICE DAILY   NASAL DECONGESTANT PE PO Take 1 tablet by mouth every 4 (four) hours as needed (Congestion).   nitroGLYCERIN 0.4 MG SL tablet Commonly known as: NITROSTAT Place 1 tablet (0.4 mg total) under the tongue every 5 (five) minutes as needed for chest pain.   OVER THE COUNTER MEDICATION Take 1 tablet by mouth every 6 (six) hours. Cough and cold   oxyCODONE-acetaminophen 5-325 MG tablet Commonly known as: Percocet Take 1 tablet by mouth every 6 (six) hours as needed for severe pain.   pantoprazole 40 MG tablet Commonly known as: Protonix Take 1 tablet (40 mg total) by mouth daily.   simethicone 125 MG chewable tablet Commonly known as: MYLICON Chew 759 mg by mouth every 6 (six) hours as needed for flatulence.         [facility] Discharge Instructions Carotid Endarterectomy  (CEA)  Please refer to the following instructions for your post-procedure care. Your surgeon or physician assistant will discuss any changes with you.  Activity  You are encouraged to walk as much as you can. You can slowly return to normal activities but must avoid strenuous activity and heavy lifting until your doctor tell you it's OK. Avoid activities such as vacuuming or swinging a golf club. You can drive after one week if you are comfortable and you are no longer taking prescription pain medications. It is normal to feel tired for serval weeks after your surgery. It is also normal to have difficulty with sleep habits, eating, and bowel movements after surgery. These will go away with time.  Bathing/Showering  You may shower after you come home. Do not soak  in a bathtub, hot tub, or swim until the incision heals completely.  Incision Care  Shower every day. Clean your incision with mild soap and water. Pat the area dry with a clean towel. You do not need a bandage unless otherwise instructed. Do not apply any ointments or creams to your incision. You may have skin glue on your incision. Do not peel it off. It will come off on its own in about one week. Your incision may feel thickened and raised for several weeks after your surgery. This is normal and the skin will soften over time. For Men Only: It's OK to shave around the incision but do not shave the incision itself for 2 weeks. It is common to have numbness under your chin that could last for several months.  Diet  Resume your normal diet. There are no special food restrictions following this procedure. A low fat/low cholesterol diet is recommended for all patients with vascular disease. In order to heal from your surgery, it is CRITICAL to get adequate nutrition. Your body requires vitamins, minerals, and protein. Vegetables are the best source of vitamins and minerals. Vegetables also provide the perfect balance of protein. Processed  food has little nutritional value, so try to avoid this.  Medications  Resume taking all of your medications unless your doctor or physician assistant tells you not to.  If your incision is causing pain, you may take over-the- counter pain relievers such as acetaminophen (Tylenol). If you were prescribed a stronger pain medication, please be aware these medications can cause nausea and constipation.  Prevent nausea by taking the medication with a snack or meal. Avoid constipation by drinking plenty of fluids and eating foods with a high amount of fiber, such as fruits, vegetables, and grains.  Do not take Tylenol if you are taking prescription pain medications.  Follow Up  Our office will schedule a follow up appointment 2-3 weeks following discharge.  Please call us immediately for any of the following conditions  Increased pain, redness, drainage (pus) from your incision site. Fever of 101 degrees or higher. If you should develop stroke (slurred speech, difficulty swallowing, weakness on one side of your body, loss of vision) you should call 911 and go to the nearest emergency room.  Reduce your risk of vascular disease:  Stop smoking. If you would like help call [facility] at 1-800-QUIT-NOW 306-811-3284) or [place] at [phone]. Manage your cholesterol Maintain a desired weight Control your diabetes Keep your blood pressure down  If you have any questions, please call the office at [phone].  Prescriptions given: 1.   Roxicet #8 No Refill  Disposition: home  Patient's condition: is Good  Follow up: 1. [facility] in 2-3 weeks in [place] with Dr. Donnetta Hutching 2.  PCP in one week for BP/pulmonary and medication review after surgery  Leontine Locket, PA-C [facility] [phone]   --- For [facility] Registry use ---   Modified Rankin score at D/C (0-6): 0  IV medication needed for:  1. Hypertension: No 2. Hypotension: No  Post-op Complications: No  1.  Post-op CVA or TIA: No  If yes: Event classification (right eye, left eye, right cortical, left cortical, verterobasilar, other): n/a  If yes: Timing of event (intra-op, <6 hrs post-op, >=6 hrs post-op, unknown): n/a  2. CN injury: No  If yes: CN n/a injuried   3. Myocardial infarction: No  If yes: Dx by (EKG or clinical, Troponin): n/a  4.  CHF: No  5.  Dysrhythmia (new): No  6. Wound infection: No  7. Reperfusion symptoms: No  8. Return to OR: No  If yes: return to OR for (bleeding, neurologic, other CEA incision, other): n/a  Discharge medications: Statin use:  Yes ASA use:  Yes   Beta blocker use:  Yes ACE-Inhibitor use:  Yes  ARB use:  No CCB use: No P2Y12 Antagonist use: Yes, [ x] Plavix, '[ ]'$  Plasugrel, '[ ]'$  Ticlopinine, '[ ]'$  Ticagrelor, '[ ]'$  Other, '[ ]'$  No for medical reason, '[ ]'$  Non-compliant, '[ ]'$  Not-indicated Anti-coagulant use:  No, '[ ]'$  Warfarin, '[ ]'$  Rivaroxaban, '[ ]'$  Dabigatran,

## 2021-11-15 NOTE — TOC Progression Note (Addendum)
Transition of Care [facility]) - Progression Note    Patient Details  Name: Kenneth Kelly MRN: [id number] Date of Birth: 1950/11/28  Transition of Care [facility]) CM/SW Contact  Zenon Mayo, RN Phone Number: 11/15/2021, 10:26 AM  Clinical Narrative:    Patient is for possible dc today, per Staff RN due to change in resp status they are gonna do a cxr and watch him before discharge TOC following. Looks like will need oxygen.NCM spoke with patient, he does not have a preference for the agency , NCM will make referral thru parachute with [employer] for the home oxygen set up.  The e tank will be delivered to his room and the concentrator will be set up at his home.         Expected Discharge Plan and Services           Expected Discharge Date: 11/15/21                                     Social Determinants of Health (SDOH) Interventions    Readmission Risk Interventions     No data to display

## 2021-11-15 NOTE — Plan of Care (Signed)
Problem: Education: Goal: Knowledge of General Education information will improve Description: Including pain rating scale, medication(s)/side effects and non-pharmacologic comfort measures 11/15/2021 1633 by Lucilla Lame, RN Outcome: Adequate for Discharge 11/15/2021 1632 by Lucilla Lame, RN Outcome: Progressing   Problem: Health Behavior/Discharge Planning: Goal: Ability to manage health-related needs will improve 11/15/2021 1633 by Lucilla Lame, RN Outcome: Adequate for Discharge 11/15/2021 1632 by Lucilla Lame, RN Outcome: Progressing   Problem: Clinical Measurements: Goal: Ability to maintain clinical measurements within normal limits will improve 11/15/2021 1633 by Lucilla Lame, RN Outcome: Adequate for Discharge 11/15/2021 1632 by Lucilla Lame, RN Outcome: Progressing Goal: Will remain free from infection 11/15/2021 1633 by Lucilla Lame, RN Outcome: Adequate for Discharge 11/15/2021 1632 by Lucilla Lame, RN Outcome: Progressing Goal: Diagnostic test results will improve 11/15/2021 1633 by Lucilla Lame, RN Outcome: Adequate for Discharge 11/15/2021 1632 by Lucilla Lame, RN Outcome: Progressing Goal: Respiratory complications will improve 11/15/2021 1633 by Lucilla Lame, RN Outcome: Adequate for Discharge 11/15/2021 1632 by Lucilla Lame, RN Outcome: Progressing Goal: Cardiovascular complication will be avoided 11/15/2021 1633 by Lucilla Lame, RN Outcome: Adequate for Discharge 11/15/2021 1632 by Lucilla Lame, RN Outcome: Progressing   Problem: Activity: Goal: Risk for activity intolerance will decrease 11/15/2021 1633 by Lucilla Lame, RN Outcome: Adequate for Discharge 11/15/2021 1632 by Lucilla Lame, RN Outcome: Progressing   Problem: Nutrition: Goal: Adequate nutrition will be maintained 11/15/2021 1633 by Lucilla Lame, RN Outcome: Adequate for Discharge 11/15/2021 1632 by Lucilla Lame, RN Outcome:  Progressing   Problem: Coping: Goal: Level of anxiety will decrease 11/15/2021 1633 by Lucilla Lame, RN Outcome: Adequate for Discharge 11/15/2021 1632 by Lucilla Lame, RN Outcome: Progressing   Problem: Elimination: Goal: Will not experience complications related to bowel motility 11/15/2021 1633 by Lucilla Lame, RN Outcome: Adequate for Discharge 11/15/2021 1632 by Lucilla Lame, RN Outcome: Progressing Goal: Will not experience complications related to urinary retention 11/15/2021 1633 by Lucilla Lame, RN Outcome: Adequate for Discharge 11/15/2021 1632 by Lucilla Lame, RN Outcome: Progressing   Problem: Pain Managment: Goal: General experience of comfort will improve 11/15/2021 1633 by Lucilla Lame, RN Outcome: Adequate for Discharge 11/15/2021 1632 by Lucilla Lame, RN Outcome: Progressing   Problem: Safety: Goal: Ability to remain free from injury will improve 11/15/2021 1633 by Lucilla Lame, RN Outcome: Adequate for Discharge 11/15/2021 1632 by Lucilla Lame, RN Outcome: Progressing   Problem: Skin Integrity: Goal: Risk for impaired skin integrity will decrease 11/15/2021 1633 by Lucilla Lame, RN Outcome: Adequate for Discharge 11/15/2021 1632 by Lucilla Lame, RN Outcome: Progressing   Problem: Education: Goal: Ability to describe self-care measures that may prevent or decrease complications (Diabetes Survival Skills Education) will improve 11/15/2021 1633 by Lucilla Lame, RN Outcome: Adequate for Discharge 11/15/2021 1632 by Lucilla Lame, RN Outcome: Progressing Goal: Individualized Educational Video(s) 11/15/2021 1633 by Lucilla Lame, RN Outcome: Adequate for Discharge 11/15/2021 1632 by Lucilla Lame, RN Outcome: Progressing   Problem: Coping: Goal: Ability to adjust to condition or change in health will improve 11/15/2021 1633 by Lucilla Lame, RN Outcome: Adequate for Discharge 11/15/2021 1632 by Lucilla Lame, RN Outcome: Progressing   Problem: Fluid Volume: Goal: Ability to maintain a balanced intake and output will improve 11/15/2021 1633 by Lucilla Lame, RN Outcome: Adequate for Discharge 11/15/2021 1632 by Aftin Lye,  Theresia Bough, RN Outcome: Progressing   Problem: Health Behavior/Discharge Planning: Goal: Ability to identify and utilize available resources and services will improve 11/15/2021 1633 by Verl Blalock, Theresia Bough, RN Outcome: Adequate for Discharge 11/15/2021 1632 by Lucilla Lame, RN Outcome: Progressing Goal: Ability to manage health-related needs will improve 11/15/2021 1633 by Lucilla Lame, RN Outcome: Adequate for Discharge 11/15/2021 1632 by Lucilla Lame, RN Outcome: Progressing   Problem: Metabolic: Goal: Ability to maintain appropriate glucose levels will improve 11/15/2021 1633 by Lucilla Lame, RN Outcome: Adequate for Discharge 11/15/2021 1632 by Lucilla Lame, RN Outcome: Progressing   Problem: Nutritional: Goal: Maintenance of adequate nutrition will improve 11/15/2021 1633 by Lucilla Lame, RN Outcome: Adequate for Discharge 11/15/2021 1632 by Lucilla Lame, RN Outcome: Progressing Goal: Progress toward achieving an optimal weight will improve 11/15/2021 1633 by Lucilla Lame, RN Outcome: Adequate for Discharge 11/15/2021 1632 by Lucilla Lame, RN Outcome: Progressing   Problem: Skin Integrity: Goal: Risk for impaired skin integrity will decrease 11/15/2021 1633 by Lucilla Lame, RN Outcome: Adequate for Discharge 11/15/2021 1632 by Lucilla Lame, RN Outcome: Progressing   Problem: Tissue Perfusion: Goal: Adequacy of tissue perfusion will improve 11/15/2021 1633 by Lucilla Lame, RN Outcome: Adequate for Discharge 11/15/2021 1632 by Lucilla Lame, RN Outcome: Progressing

## 2021-11-27 DIAGNOSIS — I6521 Occlusion and stenosis of right carotid artery: Secondary | ICD-10-CM | POA: Diagnosis not present

## 2021-11-27 DIAGNOSIS — Z6823 Body mass index (BMI) 23.0-23.9, adult: Secondary | ICD-10-CM | POA: Diagnosis not present

## 2021-11-28 NOTE — Progress Notes (Signed)
Carelink Summary Report / Loop Recorder 

## 2021-12-05 ENCOUNTER — Encounter: Payer: Self-pay | Admitting: Vascular Surgery

## 2021-12-05 ENCOUNTER — Ambulatory Visit (INDEPENDENT_AMBULATORY_CARE_PROVIDER_SITE_OTHER): Payer: Medicare HMO | Admitting: Vascular Surgery

## 2021-12-05 VITALS — BP 167/83 | HR 57 | Temp 98.2°F | Ht 62.0 in | Wt 125.8 lb

## 2021-12-05 DIAGNOSIS — Z9889 Other specified postprocedural states: Secondary | ICD-10-CM
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
## 2021-12-10 ENCOUNTER — Ambulatory Visit: Payer: Medicare HMO | Admitting: Cardiology

## 2021-12-14 ENCOUNTER — Other Ambulatory Visit: Payer: Self-pay | Admitting: Urology

## 2021-12-14 DIAGNOSIS — Z8551 Personal history of malignant neoplasm of bladder: Secondary | ICD-10-CM

## 2021-12-17 ENCOUNTER — Ambulatory Visit (INDEPENDENT_AMBULATORY_CARE_PROVIDER_SITE_OTHER): Payer: Medicare HMO

## 2021-12-17 DIAGNOSIS — I639 Cerebral infarction, unspecified: Secondary | ICD-10-CM

## 2021-12-18 LAB — CUP PACEART REMOTE DEVICE CHECK
Date Time Interrogation Session: 20231112230820
Implantable Pulse Generator Implant Date: 20220620

## 2021-12-25 ENCOUNTER — Encounter (HOSPITAL_COMMUNITY): Payer: Self-pay | Admitting: Vascular Surgery

## 2022-01-03 DIAGNOSIS — I10 Essential (primary) hypertension: Secondary | ICD-10-CM | POA: Diagnosis not present

## 2022-01-03 DIAGNOSIS — I25708 Atherosclerosis of coronary artery bypass graft(s), unspecified, with other forms of angina pectoris: Secondary | ICD-10-CM | POA: Diagnosis not present

## 2022-01-03 DIAGNOSIS — I2510 Atherosclerotic heart disease of native coronary artery without angina pectoris: Secondary | ICD-10-CM | POA: Diagnosis not present

## 2022-01-03 DIAGNOSIS — E119 Type 2 diabetes mellitus without complications: Secondary | ICD-10-CM | POA: Diagnosis not present

## 2022-01-03 DIAGNOSIS — Z6823 Body mass index (BMI) 23.0-23.9, adult: Secondary | ICD-10-CM | POA: Diagnosis not present

## 2022-01-03 DIAGNOSIS — I639 Cerebral infarction, unspecified: Secondary | ICD-10-CM | POA: Diagnosis not present

## 2022-01-03 DIAGNOSIS — E782 Mixed hyperlipidemia: Secondary | ICD-10-CM | POA: Diagnosis not present

## 2022-01-03 DIAGNOSIS — Z23 Encounter for immunization: Secondary | ICD-10-CM | POA: Diagnosis not present

## 2022-01-03 DIAGNOSIS — F17290 Nicotine dependence, other tobacco product, uncomplicated: Secondary | ICD-10-CM | POA: Diagnosis not present

## 2022-01-09 ENCOUNTER — Ambulatory Visit: Payer: Medicare HMO | Admitting: Vascular Surgery

## 2022-01-09 ENCOUNTER — Other Ambulatory Visit: Payer: Self-pay

## 2022-01-09 ENCOUNTER — Encounter: Payer: Self-pay | Admitting: Vascular Surgery

## 2022-01-09 VITALS — BP 203/92 | HR 63 | Temp 98.4°F | Ht 62.0 in | Wt 128.2 lb

## 2022-01-09 DIAGNOSIS — Z9889 Other specified postprocedural states: Secondary | ICD-10-CM

## 2022-01-09 DIAGNOSIS — I6521 Occlusion and stenosis of right carotid artery: Secondary | ICD-10-CM

## 2022-01-09 NOTE — Progress Notes (Signed)
[facility]  Patient name: Kenneth Kelly MRN: [id number] DOB: 17-Oct-1950 Sex: male  REASON FOR VISIT: Follow-up right carotid endarterectomy on 11/13/2021 with Dr. Donzetta Matters.  HPI: ALECXIS Kelly is a 71 y.o. male here today for follow-up.  I saw him in the office several weeks following his right carotid endarterectomy.  He had had 2 episodes of confusion with no focal deficits.  I suggested that we see him back in 1 month for continued discussion.  He was brought today by his sister-in-law.  Unfortunately she fell on her way to our office and presented to the [facility] emergency room for laceration of her lip.  At our last visit, she was able to provide some history as well.  Kenneth Kelly denies any ongoing headache.  He denies any focal neurologic deficits or any new issues of confusion.  I do not have his family to corroborate this.  Current Outpatient Medications  Medication Sig Dispense Refill   amLODipine (NORVASC) 10 MG tablet Take 10 mg by mouth daily.     aspirin 81 MG EC tablet Take 1 tablet (81 mg total) by mouth daily. Swallow whole. 30 tablet 11   atorvastatin (LIPITOR) 40 MG tablet Take 1 tablet (40 mg total) by mouth daily. 90 tablet 1   clopidogrel (PLAVIX) 75 MG tablet Take 75 mg by mouth daily.     ibuprofen (ADVIL) 200 MG tablet Take 400 mg by mouth every 6 (six) hours as needed for mild pain or moderate pain.     lisinopril (PRINIVIL,ZESTRIL) 10 MG tablet Take 10 mg by mouth every morning.     metoprolol tartrate (LOPRESSOR) 25 MG tablet TAKE 1 TABLET TWICE DAILY 180 tablet 1   nitroGLYCERIN (NITROSTAT) 0.4 MG SL tablet Place 1 tablet (0.4 mg total) under the tongue every 5 (five) minutes as needed for chest pain. 25 tablet 3   OVER THE COUNTER MEDICATION Take 1 tablet by mouth every 6 (six) hours. Cough and cold     Phenylephrine HCl (NASAL DECONGESTANT PE PO) Take 1 tablet by mouth every 4 (four) hours as needed  (Congestion).     simethicone (MYLICON) 102 MG chewable tablet Chew 125 mg by mouth every 6 (six) hours as needed for flatulence.     pantoprazole (PROTONIX) 40 MG tablet Take 1 tablet (40 mg total) by mouth daily. (Patient not taking: Reported on 10/22/2021) 30 tablet 0   No current facility-administered medications for this visit.     PHYSICAL EXAM: Vitals:   01/09/22 1406 01/09/22 1410  BP: (!) 211/104 (!) 203/92  Pulse: 63   Temp: 98.4 F (36.9 C)   SpO2: 95%   Weight: 128 lb 3.7 oz (58.2 kg)   Height: '5\' 2"'$  (1.575 m)     GENERAL: The patient is a well-nourished male, in no acute distress. The vital signs are documented above. Neck incision is well-healed with no bruits bilaterally.  Grossly intact neurologically.  MEDICAL ISSUES: No ongoing issues regarding episodes of confusion.  I feel that it is safe for him to resume his full activities.  We will see him in 6 months with carotid duplex follow-up.  He does have a known 4.2 cm aneurysm and follow-up of this has already been scheduled for 1 year   Rosetta Posner, MD FACS [facility] [place] Office Tel [phone]  Note: Portions of this report may have been transcribed using voice recognition software.  Every effort has  been made to ensure accuracy; however, inadvertent computerized transcription errors may still be present.

## 2022-01-21 ENCOUNTER — Ambulatory Visit (INDEPENDENT_AMBULATORY_CARE_PROVIDER_SITE_OTHER): Payer: Medicare HMO

## 2022-01-21 DIAGNOSIS — I639 Cerebral infarction, unspecified: Secondary | ICD-10-CM

## 2022-01-22 LAB — CUP PACEART REMOTE DEVICE CHECK
Date Time Interrogation Session: 20231217231958
Implantable Pulse Generator Implant Date: 20220620

## 2022-01-24 ENCOUNTER — Encounter: Payer: Self-pay | Admitting: Nurse Practitioner

## 2022-01-24 ENCOUNTER — Ambulatory Visit: Payer: Medicare HMO | Attending: Cardiology | Admitting: Nurse Practitioner

## 2022-01-24 VITALS — BP 130/70 | HR 60 | Ht 61.0 in | Wt 125.8 lb

## 2022-01-24 DIAGNOSIS — R55 Syncope and collapse: Secondary | ICD-10-CM | POA: Diagnosis not present

## 2022-01-24 DIAGNOSIS — I714 Abdominal aortic aneurysm, without rupture, unspecified: Secondary | ICD-10-CM | POA: Diagnosis not present

## 2022-01-24 DIAGNOSIS — I779 Disorder of arteries and arterioles, unspecified: Secondary | ICD-10-CM

## 2022-01-24 DIAGNOSIS — Z720 Tobacco use: Secondary | ICD-10-CM

## 2022-01-24 DIAGNOSIS — I10 Essential (primary) hypertension: Secondary | ICD-10-CM

## 2022-01-24 DIAGNOSIS — I2510 Atherosclerotic heart disease of native coronary artery without angina pectoris: Secondary | ICD-10-CM

## 2022-01-24 DIAGNOSIS — E785 Hyperlipidemia, unspecified: Secondary | ICD-10-CM

## 2022-01-24 MED ORDER — NICOTINE 21 MG/24HR TD PT24: 21.0000 mg | MEDICATED_PATCH | Freq: Every day | TRANSDERMAL | 0 refills | Status: DC

## 2022-01-24 NOTE — Patient Instructions (Addendum)
Medication Instructions:  Your physician recommends that you continue on your current medications as directed. Please refer to the Current Medication list given to you today. Nicotine 21 mg patch sent to the [facility] office for refills   Labwork: none  Testing/Procedures: none  Follow-Up: Your physician recommends that you schedule a follow-up appointment in: 6 months with Dr. Harl Bowie  Any Other Special Instructions Will Be Listed Below (If Applicable).  If you need a refill on your cardiac medications before your next appointment, please call your pharmacy.

## 2022-01-24 NOTE — Progress Notes (Signed)
Cardiology Office Note:    Date:  01/24/2022  ID:  Kenneth Kelly, DOB 1950-05-10, MRN [id number]  PCP:  Sharilyn Sites, [place] [facility] Cardiologist:  Carlyle Dolly, MD Electrophysiologist:  Cristopher Peru, MD     Referring MD: Sharilyn Sites, MD   CC: Here for 6 month follow-up  History of Present Illness:    Kenneth Kelly is a 71 y.o. male with a hx of the following:  AAA, infrarenal aortic aneurysm Hypertension CAD, s/p CABG x 3 in 2001 Carotid artery disease Acute CVA Type 2 diabetes Hyperlipidemia Tobacco abuse   Patient is a very pleasant 71 year old male with past medical history mentioned above.  In 2001 he underwent three-vessel CABG with LIMA to Lcx, RIMA to LAD, SVG to RCA.  Had a nuclear stress test in 2018 that was low risk, showed mild apical inferior ischemia.  Echocardiogram in 2022 revealed normal EF, no regional wall motion abnormalities, grade 1 DD, mild RV dysfunction.  History of AAA, this has been closely monitored.  In 2017 AAA measured 3.4 x 3.1 cm.  Had a AAA ultrasound in 2022 that was measured at 4.1 cm.  History of cryptogenic stroke and has been closely followed by EP, said loop recorder placed.  Normal device check in 2022.  Neuro in 2022 recommended DAPT x 3 weeks, then aspirin alone.  It was thought that his carotid artery stenosis did not appear to be the cause of his CVA, it was thought to be embolic event to the right PICA.  Last seen by Dr. Carlyle Dolly on May 31, 2021.  Was overall doing very well.  Denied any chest pains or shortness of breath.  Was compliant with his medications.  Noted some mild dizziness, remained on Lopressor.  Blood pressure was soft in office that day 104/50, heart rate 56.  Lopressor was decreased to 25 mg twice daily.  It was discussed to follow-up in 6 months.  Since last follow-up visit with Dr. Carlyle Dolly, he underwent right CEA on 11/13/2021. There was high-grade ICA stenosis  extending from the bifurcation to approximately 2 cm above this.  Tolerated procedure well and transported to PACU in good condition.  He was discharged home on postop day 2 in stable condition.  Today he presents for 64-monthfollow-up.  He states he is doing well.  He states surrounding his surgery in October, he had a presyncopal episode, vomited, and then felt better.  Denies ever losing consciousness.  Denies any cardiac complaints surrounding the event.  Today he denies any chest pain, shortness of breath, palpitations, syncope, presyncope, dizziness, orthopnea, PND, swelling, significant weight changes, acute bleeding, or claudication.  Smokes 1 and half pack per day.  He is halfway motivated to stop smoking.  In his free time, he enjoys fishing.  He denies any other questions or concerns today.  Past Medical History:  Diagnosis Date   Abdominal aortic aneurysm (AAA) ([facility] followed by cardiologist-- dr kBronson Ing  per last CT 01-12-2016  Infrarenal aortic aneurysm 3.4cm   Arthritis    Hip   Bladder tumor    CAD (coronary artery disease)    CARDIOLOGIST-  DR KBronson Ing--  HX CABG X3 08-15-1999   First degree AV block    Gout    History of bladder cancer urologist-  dr wJeffie Pollock  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   History of fracture of pelvis    Hyperlipidemia 02/21/2009   Qualifier: Diagnosis of  By: Via LPN, [place]     Hypertension    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   LIMA to LCFx, SVG to RCA, RIMA to LAD   Sinus bradycardia on ECG    Stroke [facility]) 2022   Type 2 diabetes mellitus ([place])     Past Surgical History:  Procedure Laterality Date   CARDIAC CATHETERIZATION  07-20-1999  dr wall   significant 2v CAD, total occlusion LAD an 80% LCFx neither amenable to PCI   CARDIOVASCULAR STRESS TEST  04-23-2016  dr Bronson Ing ([place])   Low risk nuclear study w/ small, mild intensity, reversible mid to apical inferior defect that is consistent to ischemia but no diagnotic ST  segment changes to indicate ischemia/  normal LV function and wall motion ,  nuclear stress ef 57%   CORONARY ARTERY BYPASS GRAFT  08/15/1999   dr gerhardt [facility]   x3, LIMA to left circumflex, SVG to RCA, and right internal mammary artery graft to the LAD   CYSTOSCOPY WITH BIOPSY N/A 04/08/2017   Procedure: CYSTOSCOPY;  Surgeon: [name], MD;  [facility];  Service: Urology;  Laterality: N/A;   CYSTOSCOPY WITH STENT PLACEMENT Right 01/23/2016   Procedure: CYSTOSCOPY WITH STENT PLACEMENT;  Surgeon: [name], MD;  Location: [facility];  Service: Urology;  Laterality: Right;   ENDARTERECTOMY Right 11/13/2021   Procedure: ENDARTERECTOMY CAROTID;  Surgeon: Waynetta Sandy, MD;  Location: [place];  Service: Vascular;  Laterality: Right;   KNEE ARTHROSCOPY Right 2001  approx.   LOOP RECORDER INSERTION N/A 07/24/2020   Procedure: LOOP RECORDER INSERTION;  Surgeon: Evans Lance, MD;  Location: [place] [facility];  Service: Cardiovascular;  Laterality: N/A;   PATCH ANGIOPLASTY Right 11/13/2021   Procedure: PATCH ANGIOPLASTY USING 1CM X 6CM BOVINE PATCH;  Surgeon: Waynetta Sandy, MD;  Location: [place];  Service: Vascular;  Laterality: Right;   POSTERIOR LAMINECTOMY / DECOMPRESSION LUMBAR SPINE  09-01-2009   [facility]   TRANSURETHRAL RESECTION OF BLADDER TUMOR WITH MITOMYCIN-C Bilateral 01/23/2016   Procedure: CYSTOSCOPY ,TRANSURETHRAL RESECTION OF BLADDER TUMOR;  Surgeon: [name], MD;  Location: [facility];  Service: Urology;  Laterality: Bilateral;    Current Medications: Current Meds  Medication Sig   amLODipine (NORVASC) 10 MG tablet Take 10 mg by mouth daily.   aspirin 81 MG EC tablet Take 1 tablet (81 mg total) by mouth daily. Swallow whole.   atorvastatin (LIPITOR) 40 MG tablet Take 1 tablet (40 mg total) by mouth daily.   clopidogrel (PLAVIX) 75 MG tablet Take 75 mg by mouth daily.   ibuprofen (ADVIL) 200 MG tablet Take 400 mg by mouth every 6 (six) hours as  needed for mild pain or moderate pain.   lisinopril (PRINIVIL,ZESTRIL) 10 MG tablet Take 10 mg by mouth every morning.   metFORMIN (GLUCOPHAGE) 1000 MG tablet Take 1,000 mg by mouth 2 (two) times daily.   metoprolol tartrate (LOPRESSOR) 25 MG tablet TAKE 1 TABLET TWICE DAILY   nicotine (NICODERM CQ - DOSED IN MG/24 HOURS) 21 mg/24hr patch Place 1 patch (21 mg total) onto the skin daily.   nitroGLYCERIN (NITROSTAT) 0.4 MG SL tablet Place 1 tablet (0.4 mg total) under the tongue every 5 (five) minutes as needed for chest pain.   OVER THE COUNTER MEDICATION Take 1 tablet by mouth every 6 (six) hours. Cough and cold   Phenylephrine HCl (NASAL DECONGESTANT PE PO) Take 1 tablet by mouth every 4 (four) hours as needed (Congestion).  simethicone (MYLICON) 161 MG chewable tablet Chew 125 mg by mouth every 6 (six) hours as needed for flatulence.     Allergies:   Patient has no known allergies.   Social History   Socioeconomic History   Marital status: Divorced    Spouse name: Not on file   Number of children: Not on file   Years of education: Not on file   Highest education level: Not on file  Occupational History   Not on file  Tobacco Use   Smoking status: Every Day    Packs/day: 1.00    Years: 56.00    Total pack years: 56.00    Types: Cigarettes   Smokeless tobacco: Never  Vaping Use   Vaping Use: Never used  Substance and Sexual Activity   Alcohol use: Yes    Comment: rare   Drug use: No   Sexual activity: Yes  Other Topics Concern   Not on file  Social History Narrative   Not on file   Social Determinants of Health   Financial Resource Strain: Not on file  Food Insecurity: No Food Insecurity (09/04/2020)   Hunger Vital Sign    Worried About Running Out of Food in the Last Year: Never true    Ran Out of Food in the Last Year: Never true  Transportation Needs: No Transportation Needs (09/04/2020)   PRAPARE - Hydrologist (Medical): No    Lack of  Transportation (Non-Medical): No  Physical Activity: Not on file  Stress: Not on file  Social Connections: Not on file     Family History: The patient's family history includes Heart attack in his brother; Heart disease in his brother, brother, and father.  ROS:   Review of Systems  Constitutional: Negative.   HENT: Negative.    Eyes: Negative.   Respiratory: Negative.    Cardiovascular: Negative.   Gastrointestinal: Negative.   Genitourinary: Negative.   Musculoskeletal: Negative.   Skin: Negative.   Neurological: Negative.   Endo/Heme/Allergies: Negative.   Psychiatric/Behavioral: Negative.      Please see the history of present illness.     All other systems reviewed and are negative.  EKGs/Labs/Other Studies Reviewed:    The following studies were reviewed today:   EKG:  EKG is not ordered today.  ABI's on 10/10/2021: Summary:  Right: Resting right ankle-brachial index indicates moderate right lower  extremity arterial disease. The right toe-brachial index is abnormal.   Left: Resting left ankle-brachial index indicates moderate left lower  extremity arterial disease. The left toe-brachial index is abnormal.  Bilateral carotid duplex on September 12, 2021: Summary:  Right Carotid: Velocities in the right ICA are consistent with a 80-99%                 stenosis.   Left Carotid: Velocities in the left ICA are consistent with a 60-79%  stenosis.   Vertebrals: Bilateral vertebral arteries demonstrate antegrade flow.  Subclavians: Normal flow hemodynamics were seen in bilateral subclavian               arteries.    AAA vascular ultrasound on September 12, 2021: Summary:  Abdominal Aorta: There is evidence of abnormal dilatation of the mid and  distal Abdominal aorta. There is evidence of abnormal dilation of the Left  Common Iliac artery. The largest aortic measurement is 4.2 cm. The largest  aortic diameter remains  essentially unchanged compared to prior exam.  Previous diameter  measurement was obtained  on 09/19/20.  Echocardiogram on July 22, 2020: 1. Left ventricular ejection fraction, by estimation, is 60 to 65%. The  left ventricle has normal function. The left ventricle has no regional  wall motion abnormalities. There is moderate asymmetric left ventricular  hypertrophy of the basal-septal  segment. Left ventricular diastolic parameters are consistent with Grade I  diastolic dysfunction (impaired relaxation).   2. Right ventricular systolic function is mildly reduced. The right  ventricular size is mildly enlarged. Tricuspid regurgitation signal is  inadequate for assessing PA pressure.   3. Left atrial size was moderately dilated.   4. The mitral valve is abnormal. Trivial mitral valve regurgitation.   5. The aortic valve is tricuspid. Aortic valve regurgitation is not  visualized. Mild aortic valve sclerosis is present, with no evidence of  aortic valve stenosis. Aortic valve mean gradient measures 3.5 mmHg.   6. The inferior vena cava is normal in size with <50% respiratory  variability, suggesting right atrial pressure of 8 mmHg.   7. Agitated saline contrast bubble study was negative, with no evidence  of any interatrial shunt.   Comparison(s): No prior Echocardiogram.  Myoview on April 23, 2016: No diagnostic ST segment changes to indicate ischemia. Small, mild intensity, reversible mid to apical inferior defect that is consistent with ischemia. This is a low risk study. Nuclear stress EF: 57%.   Recent Labs: 10/23/2021: ALT 21 11/14/2021: Hemoglobin 11.0; Platelets 169 11/15/2021: BUN 17; Creatinine, Ser 0.92; Potassium 3.8; Sodium 142  Recent Lipid Panel    Component Value Date/Time   CHOL 104 11/14/2021 0341   TRIG 36 11/14/2021 0341   HDL 33 (L) 11/14/2021 0341   CHOLHDL 3.2 11/14/2021 0341   VLDL 7 11/14/2021 0341   LDLCALC 64 11/14/2021 0341    Physical Exam:    VS:  BP 130/70   Pulse 60   Ht '5\' 1"'$  (1.549  m)   Wt 125 lb 12.8 oz (57.1 kg)   SpO2 93%   BMI 23.77 kg/m     Wt Readings from Last 3 Encounters:  01/24/22 125 lb 12.8 oz (57.1 kg)  01/09/22 128 lb 3.7 oz (58.2 kg)  12/05/21 125 lb 12.8 oz (57.1 kg)     GEN: Well nourished, well developed in no acute distress HEENT: Normal NECK: No JVD; No carotid bruits CARDIAC: S1/S2, RRR, no murmurs, rubs, gallops; 2+ peripheral pulses throughout, strong bilaterally RESPIRATORY:  Clear and diminished to auscultation without rales, wheezing or rhonchi  MUSCULOSKELETAL:  No edema; No deformity  SKIN: Warm and dry NEUROLOGIC:  Alert and oriented x 3 PSYCHIATRIC:  Normal affect   ASSESSMENT:    1. Coronary artery disease involving native heart without angina pectoris, unspecified vessel or lesion type   2. Carotid artery disease, unspecified laterality, unspecified type ([place])   3. Near syncope   4. Hyperlipidemia, unspecified hyperlipidemia type   5. Abdominal aortic aneurysm (AAA) without rupture, unspecified part ([place])   6. Hypertension, unspecified type   7. Tobacco abuse    PLAN:    In order of problems listed above:  CAD, s/p CABG x 3 in 2001 Stable with no anginal symptoms. No indication for ischemic evaluation.  Continue aspirin, Lipitor, Plavix, Lopressor, lisinopril, and nitroglycerin as needed. Heart healthy diet and regular cardiovascular exercise encouraged.   Carotid artery disease, s/p R CEA in 11/2021, presycnope He is followed by vascular surgery.  Underwent right CEA in October 2023.  He has pending carotid duplex ordered, but it appears it has  not been arranged.  Continue to follow-up with [facility].  Had presyncopal episode surrounding after surgery. Remote device check around that time normal. Most likely d/t dehydration from vomiting, no recurrent episodes. Will continue to monitor. Continue current medication regimen. Heart healthy diet and regular cardiovascular exercise encouraged.   HLD Labs from October 2023  revealed total cholesterol 104, HDL 33, LDL 64, and triglycerides 36.  Continue current medication regimen. Heart healthy diet and regular cardiovascular exercise encouraged.   AAA, infrarenal aortic aneurysm He is being followed by vascular surgery for this.  Last AAA duplex 09/2021 revealed stable mild AAA, previous measurement of infrarenal aortic aneurysm 3.4 cm.  There was evidence of abnormal dilatation of left common iliac artery.  The largest aortic measurement was 4.2 cm.  Stable.   Continue follow-up with VVS. Heart healthy diet and regular cardiovascular exercise encouraged.   HTN Blood pressure stable today.  BP well-controlled at home.  Continue current medication regimen. Discussed to monitor BP at home at least 2 hours after medications and sitting for 5-10 minutes. Heart healthy diet and regular cardiovascular exercise encouraged.   Tobacco abuse Smoking 1.5 PPD.  Smoking cessation encouraged and discussed.  Will write prescription for Nicotine patch 21 mg daily x 30 days, and he will contact our office for a refill when needed.   7.  Disposition: Follow-up with Dr. Carlyle Dolly in 6 months or sooner if anything changes.   Medication Adjustments/Labs and Tests Ordered: Current medicines are reviewed at length with the patient today.  Concerns regarding medicines are outlined above.  No orders of the defined types were placed in this encounter.  Meds ordered this encounter  Medications   nicotine (NICODERM CQ - DOSED IN MG/24 HOURS) 21 mg/24hr patch    Sig: Place 1 patch (21 mg total) onto the skin daily.    Dispense:  28 patch    Refill:  0    Patient Instructions  Medication Instructions:  Your physician recommends that you continue on your current medications as directed. Please refer to the Current Medication list given to you today. Nicotine 21 mg patch sent to the [facility] office for refills    Labwork: none  Testing/Procedures: none  Follow-Up: Your physician recommends that you schedule a follow-up appointment in: 6 months with Dr. Harl Bowie  Any Other Special Instructions Will Be Listed Below (If Applicable).  If you need a refill on your cardiac medications before your next appointment, please call your pharmacy.   SignedFinis Bud, NP  01/25/2022 9:32 AM    [place]

## 2022-01-29 NOTE — Progress Notes (Signed)
Carelink Summary Report / Loop Recorder 

## 2022-02-25 ENCOUNTER — Ambulatory Visit: Payer: Medicare HMO | Attending: Cardiology

## 2022-02-25 DIAGNOSIS — I639 Cerebral infarction, unspecified: Secondary | ICD-10-CM

## 2022-02-26 LAB — CUP PACEART REMOTE DEVICE CHECK
Date Time Interrogation Session: 20240119233006
Implantable Pulse Generator Implant Date: 20220620

## 2022-02-27 NOTE — Progress Notes (Signed)
[facility] Summary Report / Loop Recorder

## 2022-04-01 ENCOUNTER — Ambulatory Visit: Payer: Medicare HMO

## 2022-04-01 DIAGNOSIS — I639 Cerebral infarction, unspecified: Secondary | ICD-10-CM | POA: Diagnosis not present

## 2022-04-02 LAB — CUP PACEART REMOTE DEVICE CHECK
Date Time Interrogation Session: 20240221232208
Implantable Pulse Generator Implant Date: 20220620

## 2022-04-08 DIAGNOSIS — Z6823 Body mass index (BMI) 23.0-23.9, adult: Secondary | ICD-10-CM | POA: Diagnosis not present

## 2022-04-08 DIAGNOSIS — F17290 Nicotine dependence, other tobacco product, uncomplicated: Secondary | ICD-10-CM | POA: Diagnosis not present

## 2022-04-08 DIAGNOSIS — I639 Cerebral infarction, unspecified: Secondary | ICD-10-CM | POA: Diagnosis not present

## 2022-04-08 DIAGNOSIS — I739 Peripheral vascular disease, unspecified: Secondary | ICD-10-CM | POA: Diagnosis not present

## 2022-04-08 DIAGNOSIS — I10 Essential (primary) hypertension: Secondary | ICD-10-CM | POA: Diagnosis not present

## 2022-04-08 DIAGNOSIS — E118 Type 2 diabetes mellitus with unspecified complications: Secondary | ICD-10-CM | POA: Diagnosis not present

## 2022-04-08 DIAGNOSIS — E782 Mixed hyperlipidemia: Secondary | ICD-10-CM | POA: Diagnosis not present

## 2022-04-08 DIAGNOSIS — I25708 Atherosclerosis of coronary artery bypass graft(s), unspecified, with other forms of angina pectoris: Secondary | ICD-10-CM | POA: Diagnosis not present

## 2022-04-08 DIAGNOSIS — E7849 Other hyperlipidemia: Secondary | ICD-10-CM | POA: Diagnosis not present

## 2022-04-08 DIAGNOSIS — E1159 Type 2 diabetes mellitus with other circulatory complications: Secondary | ICD-10-CM | POA: Diagnosis not present

## 2022-04-13 NOTE — Progress Notes (Signed)
Carelink Summary Report / Loop Recorder 

## 2022-05-06 ENCOUNTER — Ambulatory Visit: Payer: Medicare HMO | Attending: Cardiology

## 2022-05-06 DIAGNOSIS — I639 Cerebral infarction, unspecified: Secondary | ICD-10-CM | POA: Diagnosis not present

## 2022-05-07 LAB — CUP PACEART REMOTE DEVICE CHECK
Date Time Interrogation Session: 20240331232632
Implantable Pulse Generator Implant Date: 20220620

## 2022-05-10 NOTE — Progress Notes (Signed)
Carelink Summary Report / Loop Recorder 

## 2022-05-16 ENCOUNTER — Telehealth: Payer: Self-pay

## 2022-05-16 NOTE — Telephone Encounter (Signed)
Alert received from [employer] [facility]:  ILR alert report received. Battery status OK. Normal device function. No new symptom or brady episodes. No new AF episodes. AF burden is 0% of the time.  There was a 7 second episode of VT and a 6 second true pause (pause was from January 2023-no action needed regarding pause.

## 2022-05-16 NOTE — Telephone Encounter (Signed)
Outreach made to Pt.  Pt was not aware of episode.  Advised would forward to doctor for review.  Will call back if any changes requested.  Advised would anticipate continued monitoring.  Pt indicates understanding.

## 2022-05-19 NOTE — Telephone Encounter (Signed)
Watchful waiting. GT 

## 2022-05-29 ENCOUNTER — Other Ambulatory Visit: Payer: Self-pay | Admitting: Cardiology

## 2022-05-30 ENCOUNTER — Encounter: Payer: Self-pay | Admitting: "Endocrinology

## 2022-06-10 ENCOUNTER — Ambulatory Visit (INDEPENDENT_AMBULATORY_CARE_PROVIDER_SITE_OTHER): Payer: Medicare HMO

## 2022-06-10 DIAGNOSIS — I639 Cerebral infarction, unspecified: Secondary | ICD-10-CM

## 2022-06-11 LAB — CUP PACEART REMOTE DEVICE CHECK
Date Time Interrogation Session: 20240503231255
Implantable Pulse Generator Implant Date: 20220620

## 2022-06-12 NOTE — Progress Notes (Signed)
Carelink Summary Report / Loop Recorder 

## 2022-07-08 DIAGNOSIS — M109 Gout, unspecified: Secondary | ICD-10-CM | POA: Diagnosis not present

## 2022-07-08 DIAGNOSIS — Z6823 Body mass index (BMI) 23.0-23.9, adult: Secondary | ICD-10-CM | POA: Diagnosis not present

## 2022-07-09 NOTE — Progress Notes (Signed)
Carelink Summary Report / Loop Recorder 

## 2022-07-15 ENCOUNTER — Ambulatory Visit (INDEPENDENT_AMBULATORY_CARE_PROVIDER_SITE_OTHER): Payer: Medicare HMO

## 2022-07-15 DIAGNOSIS — I639 Cerebral infarction, unspecified: Secondary | ICD-10-CM

## 2022-07-15 LAB — CUP PACEART REMOTE DEVICE CHECK
Date Time Interrogation Session: 20240609231645
Implantable Pulse Generator Implant Date: 20220620

## 2022-07-24 ENCOUNTER — Encounter: Payer: Self-pay | Admitting: "Endocrinology

## 2022-07-24 ENCOUNTER — Ambulatory Visit: Payer: Medicare HMO | Admitting: "Endocrinology

## 2022-07-24 VITALS — BP 152/82 | HR 56 | Ht 61.0 in | Wt 126.0 lb

## 2022-07-24 DIAGNOSIS — E1159 Type 2 diabetes mellitus with other circulatory complications: Secondary | ICD-10-CM | POA: Diagnosis not present

## 2022-07-24 DIAGNOSIS — I10 Essential (primary) hypertension: Secondary | ICD-10-CM | POA: Diagnosis not present

## 2022-07-24 DIAGNOSIS — Z7984 Long term (current) use of oral hypoglycemic drugs: Secondary | ICD-10-CM | POA: Diagnosis not present

## 2022-07-24 DIAGNOSIS — E782 Mixed hyperlipidemia: Secondary | ICD-10-CM | POA: Diagnosis not present

## 2022-07-24 DIAGNOSIS — F17200 Nicotine dependence, unspecified, uncomplicated: Secondary | ICD-10-CM

## 2022-07-24 LAB — POCT GLYCOSYLATED HEMOGLOBIN (HGB A1C): HbA1c, POC (controlled diabetic range): 8.1 % — AB (ref 0.0–7.0)

## 2022-07-24 MED ORDER — GLIPIZIDE ER 5 MG PO TB24
5.0000 mg | ORAL_TABLET | Freq: Every day | ORAL | 3 refills | Status: DC
Start: 2022-07-24 — End: 2023-06-16

## 2022-07-24 NOTE — Patient Instructions (Signed)

## 2022-07-24 NOTE — Progress Notes (Signed)
Endocrinology Consult Note       07/24/2022, 5:42 PM   Subjective:    Patient ID: Kenneth Kelly, male    DOB: [birth year].  Kenneth Kelly is being seen in consultation for management of currently uncontrolled symptomatic diabetes requested by  Assunta Found, MD.   Past Medical History:  Diagnosis Date   Abdominal aortic aneurysm (AAA) [facility]) followed by cardiologist-- dr Purvis Sheffield   per last CT 01-12-2016  Infrarenal aortic aneurysm 3.4cm   Arthritis    Hip   Bladder tumor    CAD (coronary artery disease)    CARDIOLOGIST-  DR Purvis Sheffield---  HX CABG X3 08-15-1999   First degree AV block    Gout    History of bladder cancer urologist-  dr Annabell Howells   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   History of fracture of pelvis    Hyperlipidemia 02/21/2009   Qualifier: Diagnosis of  By: Via LPN, Lynn     Hypertension    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   LIMA to LCFx, SVG to RCA, RIMA to LAD   Sinus bradycardia on ECG    Stroke (HCC) 2022   Type 2 diabetes mellitus (HCC)     Past Surgical History:  Procedure Laterality Date   CARDIAC CATHETERIZATION  07-20-1999  dr wall   significant 2v CAD, total occlusion LAD an 80% LCFx neither amenable to PCI   CARDIOVASCULAR STRESS TEST  04-23-2016  dr Purvis Sheffield ([facility])   Low risk nuclear study w/ small, mild intensity, reversible mid to apical inferior defect that is consistent to ischemia but no diagnotic ST segment changes to indicate ischemia/  normal LV function and wall motion ,  nuclear stress ef 57%   CORONARY ARTERY BYPASS GRAFT  08/15/1999   dr gerhardt [facility]   x3, LIMA to left circumflex, SVG to RCA, and right internal mammary artery graft to the LAD   CYSTOSCOPY WITH BIOPSY N/A 04/08/2017   Procedure: CYSTOSCOPY;  Surgeon: Bjorn Pippin, MD;  Location: [facility];  Service: Urology;  Laterality: N/A;   CYSTOSCOPY WITH STENT PLACEMENT  Right 01/23/2016   Procedure: CYSTOSCOPY WITH STENT PLACEMENT;  Surgeon: Bjorn Pippin, MD;  Location: [facility];  Service: Urology;  Laterality: Right;   ENDARTERECTOMY Right 11/13/2021   Procedure: ENDARTERECTOMY CAROTID;  Surgeon: Maeola Harman, MD;  Location: [facility] OR;  Service: Vascular;  Laterality: Right;   KNEE ARTHROSCOPY Right 2001  approx.   LOOP RECORDER INSERTION N/A 07/24/2020   Procedure: LOOP RECORDER INSERTION;  Surgeon: Marinus Maw, MD;  Location: [facility] INVASIVE CV LAB;  Service: Cardiovascular;  Laterality: N/A;   PATCH ANGIOPLASTY Right 11/13/2021   Procedure: PATCH ANGIOPLASTY USING 1CM X 6CM BOVINE PATCH;  Surgeon: Maeola Harman, MD;  Location: [facility] OR;  Service: Vascular;  Laterality: Right;   POSTERIOR LAMINECTOMY / DECOMPRESSION LUMBAR SPINE  09-01-2009   [facility]   TRANSURETHRAL RESECTION OF BLADDER TUMOR WITH MITOMYCIN-C Bilateral 01/23/2016   Procedure: CYSTOSCOPY ,TRANSURETHRAL RESECTION OF BLADDER TUMOR;  Surgeon: Bjorn Pippin, MD;  Location: [facility];  Service: Urology;  Laterality: Bilateral;    Social  History   Socioeconomic History   Marital status: Divorced    Spouse name: Not on file   Number of children: Not on file   Years of education: Not on file   Highest education level: Not on file  Occupational History   Not on file  Tobacco Use   Smoking status: Every Day    Packs/day: 1.00    Years: 56.00    Additional pack years: 0.00    Total pack years: 56.00    Types: Cigarettes   Smokeless tobacco: Never  Vaping Use   Vaping Use: Never used  Substance and Sexual Activity   Alcohol use: Not Currently    Comment: rare   Drug use: No   Sexual activity: Yes  Other Topics Concern   Not on file  Social History Narrative   Not on file   Social Determinants of Health   Financial Resource Strain: Not on file  Food Insecurity: No Food Insecurity (09/04/2020)   Hunger Vital Sign    Worried About Running Out of Food in the Last Year: Never  true    Ran Out of Food in the Last Year: Never true  Transportation Needs: No Transportation Needs (09/04/2020)   PRAPARE - Administrator, Civil Service (Medical): No    Lack of Transportation (Non-Medical): No  Physical Activity: Not on file  Stress: Not on file  Social Connections: Not on file    Family History  Problem Relation Age of Onset   Diabetes Mother    Hypertension Father    Heart disease Father    Stroke Father    Heart attack Brother    Heart disease Brother    Heart disease Brother     Outpatient Encounter Medications as of 07/24/2022  Medication Sig   colchicine 0.6 MG tablet Take 0.6 mg by mouth as needed.   glipiZIDE (GLUCOTROL XL) 5 MG 24 hr tablet Take 1 tablet (5 mg total) by mouth daily with breakfast.   indomethacin (INDOCIN) 50 MG capsule Take 50 mg by mouth 3 (three) times daily as needed.   pantoprazole (PROTONIX) 40 MG tablet Take 40 mg by mouth daily.   amLODipine (NORVASC) 10 MG tablet Take 10 mg by mouth daily.   aspirin 81 MG EC tablet Take 1 tablet (81 mg total) by mouth daily. Swallow whole.   atorvastatin (LIPITOR) 40 MG tablet Take 1 tablet (40 mg total) by mouth daily.   clopidogrel (PLAVIX) 75 MG tablet Take 75 mg by mouth daily.   ibuprofen (ADVIL) 200 MG tablet Take 400 mg by mouth every 6 (six) hours as needed for mild pain or moderate pain.   lisinopril (PRINIVIL,ZESTRIL) 10 MG tablet Take 10 mg by mouth every morning.   metFORMIN (GLUCOPHAGE) 1000 MG tablet Take 1,000 mg by mouth 2 (two) times daily.   metoprolol tartrate (LOPRESSOR) 25 MG tablet TAKE 1 TABLET TWICE DAILY   nitroGLYCERIN (NITROSTAT) 0.4 MG SL tablet Place 1 tablet (0.4 mg total) under the tongue every 5 (five) minutes as needed for chest pain.   OVER THE COUNTER MEDICATION Take 1 tablet by mouth every 6 (six) hours. Cough and cold (Patient not taking: Reported on 07/24/2022)   Phenylephrine HCl (NASAL DECONGESTANT PE PO) Take 1 tablet by mouth every 4 (four)  hours as needed (Congestion). (Patient not taking: Reported on 07/24/2022)   simethicone (MYLICON) 125 MG chewable tablet Chew 125 mg by mouth every 6 (six) hours as needed for flatulence.   [  DISCONTINUED] nicotine (NICODERM CQ - DOSED IN MG/24 HOURS) 21 mg/24hr patch Place 1 patch (21 mg total) onto the skin daily.   No facility-administered encounter medications on file as of 07/24/2022.    ALLERGIES: No Known Allergies  VACCINATION STATUS: Immunization History  Administered Date(s) Administered   Moderna Sars-Covid-2 Vaccination 05/06/2019, 06/03/2019    Diabetes He presents for his initial diabetic visit. He has type 2 diabetes mellitus. Onset time: Patient was diagnosed at approximate age of 49 years. His disease course has been worsening. There are no hypoglycemic associated symptoms. Pertinent negatives for hypoglycemia include no confusion, headaches, pallor or seizures. Associated symptoms include polydipsia and polyuria. Pertinent negatives for diabetes include no chest pain, no fatigue, no polyphagia and no weakness. There are no hypoglycemic complications. Symptoms are worsening. Diabetic complications include a CVA and heart disease. Risk factors for coronary artery disease include dyslipidemia, male sex, hypertension, sedentary lifestyle, tobacco exposure, family history and diabetes mellitus. Current diabetic treatments: He is currently on metformin 1000 mg p.o. twice daily. His weight is fluctuating minimally (She weighed up to 160 pound in the past, however in the last 5 years his weight has been fluctuating between 125-130 pounds.). He is following a generally unhealthy diet. When asked about meal planning, he reported none. He has not had a previous visit with a dietitian. He never participates in exercise. (He did not bring any logs nor meter with him.  He has a meter at home which he has not been using.  He is accompanied by his sister-in-law to clinic.  His point-of-care A1c  today was 8.1%.  Patient gives prior history of heavy alcohol use.  He is also heavy smoker 2 packs a day for more than 50 years.) An ACE inhibitor/angiotensin II receptor blocker is being taken. Eye exam is not current.  Hyperlipidemia This is a chronic problem. The current episode started more than 1 year ago. The problem is controlled. Exacerbating diseases include diabetes. Pertinent negatives include no chest pain, myalgias or shortness of breath. Current antihyperlipidemic treatment includes statins. Risk factors for coronary artery disease include dyslipidemia, diabetes mellitus, hypertension, male sex and a sedentary lifestyle.  Hypertension This is a chronic problem. The current episode started more than 1 year ago. The problem is uncontrolled. Pertinent negatives include no chest pain, headaches, neck pain, palpitations or shortness of breath. Risk factors for coronary artery disease include dyslipidemia, diabetes mellitus, male gender, sedentary lifestyle and smoking/tobacco exposure. Past treatments include ACE inhibitors, beta blockers and calcium channel blockers. Hypertensive end-organ damage includes CAD/MI and CVA.     Review of Systems  Constitutional:  Negative for chills, fatigue, fever and unexpected weight change.  HENT:  Negative for dental problem, mouth sores and trouble swallowing.   Eyes:  Negative for visual disturbance.  Respiratory:  Negative for cough, choking, chest tightness, shortness of breath and wheezing.   Cardiovascular:  Negative for chest pain, palpitations and leg swelling.  Gastrointestinal:  Negative for abdominal distention, abdominal pain, constipation, diarrhea, nausea and vomiting.  Endocrine: Positive for polydipsia and polyuria. Negative for polyphagia.  Genitourinary:  Negative for dysuria, flank pain, hematuria and urgency.  Musculoskeletal:  Negative for back pain, gait problem, myalgias and neck pain.  Skin:  Negative for pallor, rash and wound.   Neurological:  Negative for seizures, syncope, weakness, numbness and headaches.  Psychiatric/Behavioral:  Negative for confusion and dysphoric mood.     Objective:       07/24/2022    2:41 PM 07/24/2022  2:14 PM 01/24/2022   12:47 PM  Vitals with BMI  Height  5\' 1"  5\' 1"   Weight  126 lbs 125 lbs 13 oz  BMI  23.82 23.78  Systolic 152 150 161  Diastolic 82 78 70  Pulse  56 60    BP (!) 152/82 Comment: R arm with manuel cuff. Dr.Karel Turpen made aware  Pulse (!) 56   Ht 5\' 1"  (1.549 m)   Wt 126 lb (57.2 kg)   BMI 23.81 kg/m   Wt Readings from Last 3 Encounters:  07/24/22 126 lb (57.2 kg)  01/24/22 125 lb 12.8 oz (57.1 kg)  01/09/22 128 lb 3.7 oz (58.2 kg)     Physical Exam Constitutional:      General: He is not in acute distress.    Appearance: He is well-developed.  HENT:     Head: Normocephalic and atraumatic.  Neck:     Thyroid: No thyromegaly.     Trachea: No tracheal deviation.  Cardiovascular:     Rate and Rhythm: Normal rate.     Pulses:          Dorsalis pedis pulses are 1+ on the right side and 1+ on the left side.       Posterior tibial pulses are 1+ on the right side and 1+ on the left side.     Heart sounds: Normal heart sounds, S1 normal and S2 normal. No murmur heard.    No gallop.  Pulmonary:     Effort: No respiratory distress.     Breath sounds: Wheezing present.  Abdominal:     General: Bowel sounds are normal. There is no distension.     Palpations: Abdomen is soft.     Tenderness: There is no abdominal tenderness. There is no guarding.  Musculoskeletal:     Right shoulder: No swelling or deformity.     Cervical back: Normal range of motion and neck supple.  Skin:    General: Skin is warm and dry.     Findings: No rash.     Nails: There is no clubbing.  Neurological:     Mental Status: He is alert and oriented to person, place, and time.     Cranial Nerves: No cranial nerve deficit.     Sensory: No sensory deficit.     Gait: Gait normal.      Deep Tendon Reflexes: Reflexes are normal and symmetric.  Psychiatric:        Speech: Speech normal.        Behavior: Behavior normal. Behavior is cooperative.        Thought Content: Thought content normal.        Judgment: Judgment normal.     CMP ( most recent) CMP     Component Value Date/Time   NA 142 11/15/2021 0712   K 3.8 11/15/2021 0712   CL 106 11/15/2021 0712   CO2 25 11/15/2021 0712   GLUCOSE 148 (H) 11/15/2021 0712   BUN 17 11/15/2021 0712   CREATININE 0.92 11/15/2021 0712   CALCIUM 8.9 11/15/2021 0712   PROT 7.0 10/23/2021 1429   ALBUMIN 3.9 10/23/2021 1429   AST 24 10/23/2021 1429   ALT 21 10/23/2021 1429   ALKPHOS 84 10/23/2021 1429   BILITOT 0.6 10/23/2021 1429   GFRNONAA >60 11/15/2021 0712     Diabetic Labs (most recent): Lab Results  Component Value Date   HGBA1C 8.1 (A) 07/24/2022   HGBA1C 7.6 (H) 10/23/2021   HGBA1C 7.0 (H) 07/23/2020  Lipid Panel ( most recent) Lipid Panel     Component Value Date/Time   CHOL 104 11/14/2021 0341   TRIG 36 11/14/2021 0341   HDL 33 (L) 11/14/2021 0341   CHOLHDL 3.2 11/14/2021 0341   VLDL 7 11/14/2021 0341   LDLCALC 64 11/14/2021 0341      Assessment & Plan:   1. DM type 2 causing vascular disease (HCC)   - Kenneth Kelly has currently uncontrolled symptomatic type 2 DM since  72 years of age,  with most recent A1c of 8.1 %. Recent labs reviewed. - I had a long discussion with him about the possible risk factors and  the pathology behind its diabetes and its complications. -He does not have significant history of obesity.  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  -his diabetes is complicated by coronary artery disease which required open heart surgery in 2020, CVA, comorbid hyperlipidemia/hypertension, heavy smoking and he remains at exceedingly high risk for more acute and chronic complications which include CAD, CVA, CKD, retinopathy,  and neuropathy. These are all discussed in detail with him.  - I discussed all available options of managing his diabetes including de-escalation of medications. I have counseled him on Food as Medicine . - Patient is encouraged to switch to  unprocessed or minimally processed  complex starch, adequate protein intake (mainly plant source), minimal liquid fat, plenty of fruits, and vegetables. -  he is advised to stick to a routine mealtimes to eat 3 complete meals a day and snack only when necessary ( to snack only to correct hypoglycemia BG <70 day time or <100 at night).   - he acknowledges that there is a room for improvement in his food and drink choices. - Further Specific Suggestion is made for him to avoid simple carbohydrates  from his diet including Cakes, Sweet Desserts, Ice Cream, Soda (diet and regular), Sweet Tea, Candies, Chips, Cookies, Store Bought Juices, Alcohol ,  Artificial Sweeteners,  Coffee Creamer, and "Sugar-free" Products. This will help patient to have more stable blood glucose profile and potentially avoid unintended weight gain.   - he will be scheduled with Norm Salt, RDN, CDE for individualized diabetes education.  - I have approached him with the following individualized plan to manage  his diabetes and patient agrees:   - he will not be considered for insulin treatment today.  He is approached and is willing to start monitoring blood glucose twice a day-daily before breakfast and at bedtime until he returns for follow-up in 4 weeks with his meter and logs. -In the meantime, he is advised to continue metformin 1000 units p.o. daily, discussed and added glipizide 5 mg XL p.o. daily at breakfast. 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  If he presents with unintended weight loss, he may need Creon intervention. He is encouraged to call clinic for hypoglycemia below 70 or  hyperglycemia above 300.  He is not a candidate for GLP-1 receptor agonists nor SGLT2 inhibitors because of his body habitus.   - Specific targets for  A1c;  LDL, HDL,  and Triglycerides were discussed with the patient.  2) Blood Pressure /Hypertension:  his blood pressure is  controlled to target.   he is advised to continue his current medications including lisinopril 10 mg p.o. daily, amlodipine 10 mg p.o. daily, metoprolol 25 p.o. twice daily .   3)  Lipids/Hyperlipidemia:   Review of his recent lipid panel showed  controlled  LDL at 64 .  he  is advised to continue    atorvastatin 40 mg daily at bedtime.  Side effects and precautions discussed with him.  4)  Weight/Diet:  Body mass index is 23.81 kg/m.  -    he is not a candidate for weight loss. I discussed with him the fact that loss of 5 - 10% of his  current body weight will have the most impact on his diabetes management.  The above detailed  ACLM recommendations for nutrition, exercise, sleep, social life, avoidance of risky substances, the need for restorative sleep   information will also detailed on discharge instructions.  5) Chronic Care/Health Maintenance:  -he  is on ACEI/ARB and Statin medications and  is encouraged to initiate and continue to follow up with Ophthalmology, Dentist,  Podiatrist at least yearly or according to recommendations, and advised to  quit smoking. I have recommended yearly flu vaccine and pneumonia vaccine at least every 5 years; moderate intensity exercise for up to 150 minutes weekly; and  sleep for 7- 9 hours a day.  - he is  advised to maintain close follow up with Assunta Found, MD for primary care needs, as well as his other providers for optimal and coordinated care.   Thank you for involving me in the care of this pleasant patient.  I spent  65  minutes in the care of the patient today including review of labs from CMP, Lipids, Thyroid Function, Hematology (current and previous including  abstractions from other facilities); face-to-face time discussing  his blood glucose readings/logs, discussing hypoglycemia and hyperglycemia episodes and symptoms, medications doses, his options of short and long term treatment based on the latest standards of care / guidelines;  discussion about incorporating lifestyle medicine;  and documenting the encounter. Risk reduction counseling performed per USPSTF guidelines to reduce  cardiovascular risk factors.      Please refer to Patient Instructions for Blood Glucose Monitoring and Insulin/Medications Dosing Guide"  in media tab for additional information. Please  also refer to " Patient Self Inventory" in the Media  tab for reviewed elements of pertinent patient history.  Kenneth Kelly participated in the discussions, expressed understanding, and voiced agreement with the above plans.  All questions were answered to his satisfaction. he is encouraged to contact clinic should he have any questions or concerns prior to his return visit.   Follow up plan: - Return in about 4 weeks (around 08/21/2022) for F/U with Meter/CGM Megan Salon Only - no Labs.  Marquis Lunch, MD [facility] [address] [place], [place] [address] Phone: [phone]  Fax: [phone]    07/24/2022, 5:42 PM  This note was partially dictated with voice recognition software. Similar sounding words can be transcribed inadequately or may not  be corrected upon review.

## 2022-08-06 NOTE — Progress Notes (Signed)
Carelink Summary Report / Loop Recorder 

## 2022-08-10 ENCOUNTER — Other Ambulatory Visit: Payer: Self-pay | Admitting: Cardiology

## 2022-08-19 ENCOUNTER — Ambulatory Visit (INDEPENDENT_AMBULATORY_CARE_PROVIDER_SITE_OTHER): Payer: Medicare HMO

## 2022-08-19 DIAGNOSIS — I639 Cerebral infarction, unspecified: Secondary | ICD-10-CM | POA: Diagnosis not present

## 2022-08-20 LAB — CUP PACEART REMOTE DEVICE CHECK
Date Time Interrogation Session: 20240714231441
Implantable Pulse Generator Implant Date: 20220620

## 2022-08-22 ENCOUNTER — Ambulatory Visit: Payer: Medicare HMO | Admitting: "Endocrinology

## 2022-08-22 ENCOUNTER — Encounter: Payer: Self-pay | Admitting: "Endocrinology

## 2022-08-22 VITALS — BP 112/68 | HR 64 | Ht 61.0 in | Wt 123.6 lb

## 2022-08-22 DIAGNOSIS — F17200 Nicotine dependence, unspecified, uncomplicated: Secondary | ICD-10-CM

## 2022-08-22 DIAGNOSIS — Z7984 Long term (current) use of oral hypoglycemic drugs: Secondary | ICD-10-CM | POA: Diagnosis not present

## 2022-08-22 DIAGNOSIS — K8681 Exocrine pancreatic insufficiency: Secondary | ICD-10-CM | POA: Diagnosis not present

## 2022-08-22 DIAGNOSIS — E782 Mixed hyperlipidemia: Secondary | ICD-10-CM

## 2022-08-22 DIAGNOSIS — I10 Essential (primary) hypertension: Secondary | ICD-10-CM

## 2022-08-22 DIAGNOSIS — E1159 Type 2 diabetes mellitus with other circulatory complications: Secondary | ICD-10-CM | POA: Diagnosis not present

## 2022-08-22 MED ORDER — PANCRELIPASE (LIP-PROT-AMYL) 36000-114000 UNITS PO CPEP
ORAL_CAPSULE | ORAL | 2 refills | Status: DC
Start: 2022-08-22 — End: 2023-06-16

## 2022-08-22 MED ORDER — METFORMIN HCL 500 MG PO TABS: 500.0000 mg | ORAL_TABLET | Freq: Two times a day (BID) | ORAL | 1 refills | Status: DC

## 2022-08-22 NOTE — Progress Notes (Signed)
Endocrinology Consult Note       08/22/2022, 5:18 PM   Subjective:    Patient ID: Kenneth Kelly, male    DOB: 1950-03-16.  Kenneth Kelly is being seen in consultation for management of currently uncontrolled symptomatic diabetes requested by  Assunta Found, MD.   Past Medical History:  Diagnosis Date   Abdominal aortic aneurysm (AAA) [facility]) followed by cardiologist-- dr Purvis Sheffield   per last CT 01-12-2016  Infrarenal aortic aneurysm 3.4cm   Arthritis    Hip   Bladder tumor    CAD (coronary artery disease)    CARDIOLOGIST-  DR Purvis Sheffield---  HX CABG X3 08-15-1999   First degree AV block    Gout    History of bladder cancer urologist-  dr Annabell Howells   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   History of fracture of pelvis    Hyperlipidemia 02/21/2009   Qualifier: Diagnosis of  By: Via LPN, Lynn     Hypertension    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   LIMA to LCFx, SVG to RCA, RIMA to LAD   Sinus bradycardia on ECG    Stroke (HCC) 2022   Type 2 diabetes mellitus (HCC)     Past Surgical History:  Procedure Laterality Date   CARDIAC CATHETERIZATION  07-20-1999  dr wall   significant 2v CAD, total occlusion LAD an 80% LCFx neither amenable to PCI   CARDIOVASCULAR STRESS TEST  04-23-2016  dr Purvis Sheffield ([facility])   Low risk nuclear study w/ small, mild intensity, reversible mid to apical inferior defect that is consistent to ischemia but no diagnotic ST segment changes to indicate ischemia/  normal LV function and wall motion ,  nuclear stress ef 57%   CORONARY ARTERY BYPASS GRAFT  08/15/1999   dr gerhardt [facility]   x3, LIMA to left circumflex, SVG to RCA, and right internal mammary artery graft to the LAD   CYSTOSCOPY WITH BIOPSY N/A 04/08/2017   Procedure: CYSTOSCOPY;  Surgeon: Bjorn Pippin, MD;  Location: [facility];  Service: Urology;  Laterality: N/A;   CYSTOSCOPY WITH STENT PLACEMENT  Right 01/23/2016   Procedure: CYSTOSCOPY WITH STENT PLACEMENT;  Surgeon: Bjorn Pippin, MD;  Location: [facility];  Service: Urology;  Laterality: Right;   ENDARTERECTOMY Right 11/13/2021   Procedure: ENDARTERECTOMY CAROTID;  Surgeon: Maeola Harman, MD;  Location: [facility] OR;  Service: Vascular;  Laterality: Right;   KNEE ARTHROSCOPY Right 2001  approx.   LOOP RECORDER INSERTION N/A 07/24/2020   Procedure: LOOP RECORDER INSERTION;  Surgeon: Marinus Maw, MD;  Location: [facility] INVASIVE CV LAB;  Service: Cardiovascular;  Laterality: N/A;   PATCH ANGIOPLASTY Right 11/13/2021   Procedure: PATCH ANGIOPLASTY USING 1CM X 6CM BOVINE PATCH;  Surgeon: Maeola Harman, MD;  Location: [facility] OR;  Service: Vascular;  Laterality: Right;   POSTERIOR LAMINECTOMY / DECOMPRESSION LUMBAR SPINE  09-01-2009   [facility]   TRANSURETHRAL RESECTION OF BLADDER TUMOR WITH MITOMYCIN-C Bilateral 01/23/2016   Procedure: CYSTOSCOPY ,TRANSURETHRAL RESECTION OF BLADDER TUMOR;  Surgeon: Bjorn Pippin, MD;  Location: [facility];  Service: Urology;  Laterality: Bilateral;    Social  History   Socioeconomic History   Marital status: Divorced    Spouse name: Not on file   Number of children: Not on file   Years of education: Not on file   Highest education level: Not on file  Occupational History   Not on file  Tobacco Use   Smoking status: Every Day    Current packs/day: 1.00    Average packs/day: 1 pack/day for 56.0 years (56.0 ttl pk-yrs)    Types: Cigarettes   Smokeless tobacco: Never  Vaping Use   Vaping status: Never Used  Substance and Sexual Activity   Alcohol use: Not Currently    Comment: rare   Drug use: No   Sexual activity: Yes  Other Topics Concern   Not on file  Social History Narrative   Not on file   Social Determinants of Health   Financial Resource Strain: Not on file  Food Insecurity: No Food Insecurity (09/04/2020)   Hunger Vital Sign    Worried About Running Out of Food in the Last Year: Never  true    Ran Out of Food in the Last Year: Never true  Transportation Needs: No Transportation Needs (09/04/2020)   PRAPARE - Administrator, Civil Service (Medical): No    Lack of Transportation (Non-Medical): No  Physical Activity: Not on file  Stress: Not on file  Social Connections: Not on file    Family History  Problem Relation Age of Onset   Diabetes Mother    Hypertension Father    Heart disease Father    Stroke Father    Heart attack Brother    Heart disease Brother    Heart disease Brother     Outpatient Encounter Medications as of 08/22/2022  Medication Sig   lipase/protease/amylase (CREON) 36000 UNITS CPEP capsule Take 1 capsule (36,000 Units total) by mouth 3 (three) times daily with meals AND 1 capsule (36,000 Units total) 3 (three) times daily before meals.   amLODipine (NORVASC) 10 MG tablet Take 10 mg by mouth daily.   aspirin 81 MG EC tablet Take 1 tablet (81 mg total) by mouth daily. Swallow whole.   atorvastatin (LIPITOR) 40 MG tablet Take 1 tablet (40 mg total) by mouth daily.   clopidogrel (PLAVIX) 75 MG tablet Take 75 mg by mouth daily.   colchicine 0.6 MG tablet Take 0.6 mg by mouth as needed.   glipiZIDE (GLUCOTROL XL) 5 MG 24 hr tablet Take 1 tablet (5 mg total) by mouth daily with breakfast.   ibuprofen (ADVIL) 200 MG tablet Take 400 mg by mouth every 6 (six) hours as needed for mild pain or moderate pain.   indomethacin (INDOCIN) 50 MG capsule Take 50 mg by mouth 3 (three) times daily as needed.   lisinopril (PRINIVIL,ZESTRIL) 10 MG tablet Take 10 mg by mouth every morning.   metFORMIN (GLUCOPHAGE) 500 MG tablet Take 1 tablet (500 mg total) by mouth 2 (two) times daily with a meal.   metoprolol tartrate (LOPRESSOR) 25 MG tablet TAKE 1 TABLET TWICE DAILY   nitroGLYCERIN (NITROSTAT) 0.4 MG SL tablet Place 1 tablet (0.4 mg total) under the tongue every 5 (five) minutes as needed for chest pain.   OVER THE COUNTER MEDICATION Take 1 tablet by mouth  every 6 (six) hours. Cough and cold (Patient not taking: Reported on 07/24/2022)   pantoprazole (PROTONIX) 40 MG tablet Take 40 mg by mouth daily.   Phenylephrine HCl (NASAL DECONGESTANT PE PO) Take 1 tablet by mouth every  4 (four) hours as needed (Congestion). (Patient not taking: Reported on 07/24/2022)   simethicone (MYLICON) 125 MG chewable tablet Chew 125 mg by mouth every 6 (six) hours as needed for flatulence.   [DISCONTINUED] metFORMIN (GLUCOPHAGE) 1000 MG tablet Take 1,000 mg by mouth 2 (two) times daily.   No facility-administered encounter medications on file as of 08/22/2022.    ALLERGIES: No Known Allergies  VACCINATION STATUS: Immunization History  Administered Date(s) Administered   Moderna Sars-Covid-2 Vaccination 05/06/2019, 06/03/2019    Diabetes He presents for his follow-up diabetic visit. He has type 2 diabetes mellitus. Onset time: Patient was diagnosed at approximate age of 49 years. His disease course has been improving. There are no hypoglycemic associated symptoms. Pertinent negatives for hypoglycemia include no confusion, headaches, pallor or seizures. Pertinent negatives for diabetes include no chest pain, no fatigue, no polydipsia, no polyphagia, no polyuria and no weakness. There are no hypoglycemic complications. Symptoms are worsening. Diabetic complications include a CVA and heart disease. Risk factors for coronary artery disease include dyslipidemia, male sex, hypertension, sedentary lifestyle, tobacco exposure, family history and diabetes mellitus. His weight is fluctuating minimally (She weighed up to 160 pound in the past, however in the last 5 years he has been progressively losing to current weight of 123 pounds.  He reports diarrhea on system review today.). He is following a generally unhealthy diet. When asked about meal planning, he reported none. He has not had a previous visit with a dietitian. He never participates in exercise. His home blood glucose  trend is decreasing steadily. His breakfast blood glucose range is generally 110-130 mg/dl. His bedtime blood glucose range is generally 130-140 mg/dl. His overall blood glucose range is 130-140 mg/dl. (He presents with significantly improved glycemic profile, tracking towards target.  He is currently on metformin 1000 mg p.o. twice daily and glipizide 5 mg XL p.o. daily at breakfast.  He is accompanied by his sister-in-law to clinic.  His recent point-of-care A1c  was 8.1%.  Patient gives prior history of heavy alcohol use.  He is also heavy smoker 2 packs a day for more than 50 years.) An ACE inhibitor/angiotensin II receptor blocker is being taken. Eye exam is not current.  Hyperlipidemia This is a chronic problem. The current episode started more than 1 year ago. The problem is controlled. Exacerbating diseases include diabetes. Pertinent negatives include no chest pain, myalgias or shortness of breath. Current antihyperlipidemic treatment includes statins. Risk factors for coronary artery disease include dyslipidemia, diabetes mellitus, hypertension, male sex and a sedentary lifestyle.  Hypertension This is a chronic problem. The current episode started more than 1 year ago. The problem is uncontrolled. Pertinent negatives include no chest pain, headaches, neck pain, palpitations or shortness of breath. Risk factors for coronary artery disease include dyslipidemia, diabetes mellitus, male gender, sedentary lifestyle and smoking/tobacco exposure. Past treatments include ACE inhibitors, beta blockers and calcium channel blockers. Hypertensive end-organ damage includes CAD/MI and CVA.     Review of Systems  Constitutional:  Negative for chills, fatigue, fever and unexpected weight change.  HENT:  Negative for dental problem, mouth sores and trouble swallowing.   Eyes:  Negative for visual disturbance.  Respiratory:  Negative for cough, choking, chest tightness, shortness of breath and wheezing.    Cardiovascular:  Negative for chest pain, palpitations and leg swelling.  Gastrointestinal:  Negative for abdominal distention, abdominal pain, constipation, diarrhea, nausea and vomiting.  Endocrine: Negative for polydipsia, polyphagia and polyuria.  Genitourinary:  Negative for dysuria, flank  pain, hematuria and urgency.  Musculoskeletal:  Negative for back pain, gait problem, myalgias and neck pain.  Skin:  Negative for pallor, rash and wound.  Neurological:  Negative for seizures, syncope, weakness, numbness and headaches.  Psychiatric/Behavioral:  Negative for confusion and dysphoric mood.     Objective:       08/22/2022    1:49 PM 07/24/2022    2:41 PM 07/24/2022    2:14 PM  Vitals with BMI  Height 5\' 1"   5\' 1"   Weight 123 lbs 10 oz  126 lbs  BMI 23.37  23.82  Systolic 112 152 956  Diastolic 68 82 78  Pulse 64  56    BP 112/68   Pulse 64   Ht 5\' 1"  (1.549 m)   Wt 123 lb 9.6 oz (56.1 kg)   BMI 23.35 kg/m   Wt Readings from Last 3 Encounters:  08/22/22 123 lb 9.6 oz (56.1 kg)  07/24/22 126 lb (57.2 kg)  01/24/22 125 lb 12.8 oz (57.1 kg)       CMP ( most recent) CMP     Component Value Date/Time   NA 142 11/15/2021 0712   K 3.8 11/15/2021 0712   CL 106 11/15/2021 0712   CO2 25 11/15/2021 0712   GLUCOSE 148 (H) 11/15/2021 0712   BUN 17 11/15/2021 0712   CREATININE 0.92 11/15/2021 0712   CALCIUM 8.9 11/15/2021 0712   PROT 7.0 10/23/2021 1429   ALBUMIN 3.9 10/23/2021 1429   AST 24 10/23/2021 1429   ALT 21 10/23/2021 1429   ALKPHOS 84 10/23/2021 1429   BILITOT 0.6 10/23/2021 1429   GFRNONAA >60 11/15/2021 2130     Diabetic Labs (most recent): Lab Results  Component Value Date   HGBA1C 8.1 (A) 07/24/2022   HGBA1C 7.6 (H) 10/23/2021   HGBA1C 7.0 (H) 07/23/2020     Lipid Panel ( most recent) Lipid Panel     Component Value Date/Time   CHOL 104 11/14/2021 0341   TRIG 36 11/14/2021 0341   HDL 33 (L) 11/14/2021 0341   CHOLHDL 3.2 11/14/2021 0341    VLDL 7 11/14/2021 0341   LDLCALC 64 11/14/2021 0341      Assessment & Plan:   1. DM type 2 causing vascular disease (HCC)   - Kenneth Kelly has currently uncontrolled symptomatic type 2 DM since  72 years of age. He presents with significantly improved glycemic profile, tracking towards target.  He is currently on metformin 1000 mg p.o. twice daily and glipizide 5 mg XL p.o. daily at breakfast.  He is accompanied by his sister-in-law to clinic.  His recent point-of-care A1c  was 8.1%.  Patient gives prior history of heavy alcohol use.  He is also heavy smoker 2 packs a day for more than 50 years. -Recent labs reviewed. - I had a long discussion with him about the possible risk factors and  the pathology behind its diabetes and its complications. -He does not have significant history of obesity.  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  -his diabetes is complicated by coronary artery disease which required open heart surgery in 2020, CVA, comorbid hyperlipidemia/hypertension, prior heavy drinking likely indicating pancreatic failure not only for beta cell function but  also for pancreatic alpha cells.   Heavy smoking and he remains at exceedingly high risk for more acute and chronic complications which include CAD, CVA, CKD, retinopathy, and neuropathy. These are all discussed in detail with him.  -  I discussed all available options of managing his diabetes including de-escalation of medications. I have counseled him on Food as Medicine . - Patient is encouraged to switch to  unprocessed or minimally processed  complex starch, adequate protein intake (mainly plant source), minimal liquid fat, plenty of fruits, and vegetables. -  he is advised to stick to a routine mealtimes to eat 3 complete meals a day and snack only when necessary ( to snack only to correct hypoglycemia BG <70 day time or <100 at night).   - he acknowledges that  there is a room for improvement in his food and drink choices. - Further Specific Suggestion is made for him to avoid simple carbohydrates  from his diet including Cakes, Sweet Desserts, Ice Cream, Soda (diet and regular), Sweet Tea, Candies, Chips, Cookies, Store Bought Juices, Alcohol ,  Artificial Sweeteners,  Coffee Creamer, and "Sugar-free" Products. This will help patient to have more stable blood glucose profile and potentially avoid unintended weight gain.   - he will be scheduled with Norm Salt, RDN, CDE for individualized diabetes education.  - I have approached him with the following individualized plan to manage  his diabetes and patient agrees:   - he will not be considered for insulin treatment today.  He is approached and is willing to continue monitoring blood glucose at least twice a day-daily before breakfast and at bedtime.    -I discussed and lowered his metformin to 500 mg p.o. twice daily.  He is advised to continue glipizide 5 mg XL p.o. daily at breakfast.   -His diabetes is likely  induced by pancreatic damage from EtOH which may make it necessary for him to need insulin treatment later.   -He is presenting with unintended weight loss and ongoing diarrhea.  I discussed and initiated Creon 1 capsule 36,000 units 3 times daily AC.  He is encouraged to call clinic for hypoglycemia below 70 or hyperglycemia above 300.  He is not a candidate for GLP-1 receptor agonists nor SGLT2 inhibitors because of his body habitus.   - Specific targets for  A1c;  LDL, HDL,  and Triglycerides were discussed with the patient.  2) Blood Pressure /Hypertension:    His blood pressure is controlled to target.   he is advised to continue his current medications including lisinopril 10 mg p.o. daily, amlodipine 10 mg p.o. daily, metoprolol 25 p.o. twice daily .   3) Lipids/Hyperlipidemia:   Review of his recent lipid panel showed  controlled  LDL at 64 .  he  is advised to continue  atorvastatin 40 mg p.o. daily at bedtime. Side effects and precautions discussed with him.  4)  Weight/Diet:  Body mass index is 23.35 kg/m.  -    he is not a candidate for weight loss.    5) exocrine pancreatic insufficiency: He would benefit from Creon intervention to assist him with the digestion and absorption of his nutrients.  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 6) Chronic Care/Health Maintenance:  -he  is on ACEI/ARB and Statin medications and  is encouraged to initiate and continue to follow up with Ophthalmology, Dentist,  Podiatrist at least yearly or according to recommendations, and advised to  quit smoking. I have recommended yearly flu vaccine and pneumonia vaccine at least every 5 years; moderate intensity exercise for up to 150 minutes weekly; and  sleep for 7- 9 hours a day.  The patient was counseled on the dangers of  tobacco use, and was advised to quit.  Reviewed strategies to maximize success, including removing cigarettes and smoking materials from environment.   - he is  advised to maintain close follow up with Assunta Found, MD for primary care needs, as well as his other providers for optimal and coordinated care.  I spent  41  minutes in the care of the patient today including review of labs from CMP, Lipids, Thyroid Function, Hematology (current and previous including abstractions from other facilities); face-to-face time discussing  his blood glucose readings/logs, discussing hypoglycemia and hyperglycemia episodes and symptoms, medications doses, his options of short and long term treatment based on the latest standards of care / guidelines;  discussion about incorporating lifestyle medicine;  and documenting the encounter. Risk reduction counseling performed per USPSTF guidelines to reduce  cardiovascular risk factors.     Please refer to Patient Instructions for Blood Glucose Monitoring and  Insulin/Medications Dosing Guide"  in media tab for additional information. Please  also refer to " Patient Self Inventory" in the Media  tab for reviewed elements of pertinent patient history.  Kenneth Kelly participated in the discussions, expressed understanding, and voiced agreement with the above plans.  All questions were answered to his satisfaction. he is encouraged to contact clinic should he have any questions or concerns prior to his return visit.    Follow up plan: - Return in about 3 months (around 11/22/2022) for F/U with Pre-visit Labs, Meter/CGM/Logs, A1c here.  Marquis Lunch, MD [facility] [address] [place], [place] [address] Phone: [phone]  Fax: [phone]    08/22/2022, 5:18 PM  This note was partially dictated with voice recognition software. Similar sounding words can be transcribed inadequately or may not  be corrected upon review.

## 2022-08-22 NOTE — Patient Instructions (Signed)

## 2022-09-02 NOTE — Progress Notes (Signed)
Carelink Summary Report / Loop Recorder 

## 2022-09-20 DIAGNOSIS — I10 Essential (primary) hypertension: Secondary | ICD-10-CM | POA: Diagnosis not present

## 2022-09-20 DIAGNOSIS — Z1331 Encounter for screening for depression: Secondary | ICD-10-CM | POA: Diagnosis not present

## 2022-09-20 DIAGNOSIS — Z6822 Body mass index (BMI) 22.0-22.9, adult: Secondary | ICD-10-CM | POA: Diagnosis not present

## 2022-09-20 DIAGNOSIS — Z0001 Encounter for general adult medical examination with abnormal findings: Secondary | ICD-10-CM | POA: Diagnosis not present

## 2022-09-23 ENCOUNTER — Ambulatory Visit (INDEPENDENT_AMBULATORY_CARE_PROVIDER_SITE_OTHER): Payer: Medicare HMO

## 2022-09-23 DIAGNOSIS — I639 Cerebral infarction, unspecified: Secondary | ICD-10-CM

## 2022-09-23 LAB — CUP PACEART REMOTE DEVICE CHECK
Date Time Interrogation Session: 20240818232031
Implantable Pulse Generator Implant Date: 20220620

## 2022-10-03 NOTE — Progress Notes (Signed)
Carelink Summary Report / Loop Recorder 

## 2022-10-24 ENCOUNTER — Other Ambulatory Visit: Payer: Self-pay | Admitting: Cardiology

## 2022-10-28 ENCOUNTER — Ambulatory Visit (INDEPENDENT_AMBULATORY_CARE_PROVIDER_SITE_OTHER): Payer: Medicare HMO

## 2022-10-28 DIAGNOSIS — I639 Cerebral infarction, unspecified: Secondary | ICD-10-CM

## 2022-10-29 LAB — CUP PACEART REMOTE DEVICE CHECK
Date Time Interrogation Session: 20240920230622
Implantable Pulse Generator Implant Date: 20220620

## 2022-11-11 NOTE — Progress Notes (Signed)
Carelink Summary Report / Loop Recorder 

## 2022-11-14 ENCOUNTER — Other Ambulatory Visit: Payer: Self-pay | Admitting: Cardiology

## 2022-11-14 DIAGNOSIS — E1159 Type 2 diabetes mellitus with other circulatory complications: Secondary | ICD-10-CM | POA: Diagnosis not present

## 2022-11-15 LAB — LIPID PANEL
Chol/HDL Ratio: 3.4 {ratio} (ref 0.0–5.0)
Cholesterol, Total: 166 mg/dL (ref 100–199)
HDL: 49 mg/dL (ref >39)
LDL Chol Calc (NIH): 99 mg/dL (ref 0–99)
Triglycerides: 99 mg/dL (ref 0–149)
VLDL Cholesterol Cal: 18 mg/dL (ref 5–40)

## 2022-11-15 LAB — COMPREHENSIVE METABOLIC PANEL
ALT: 13 [IU]/L (ref 0–44)
AST: 19 [IU]/L (ref 0–40)
Albumin: 4.4 g/dL (ref 3.8–4.8)
Alkaline Phosphatase: 124 [IU]/L — ABNORMAL HIGH (ref 44–121)
BUN/Creatinine Ratio: 26 — ABNORMAL HIGH (ref 10–24)
BUN: 35 mg/dL — ABNORMAL HIGH (ref 8–27)
Bilirubin Total: 0.4 mg/dL (ref 0.0–1.2)
CO2: 23 mmol/L (ref 20–29)
Calcium: 10 mg/dL (ref 8.6–10.2)
Chloride: 100 mmol/L (ref 96–106)
Creatinine, Ser: 1.33 mg/dL — ABNORMAL HIGH (ref 0.76–1.27)
Globulin, Total: 2.8 g/dL (ref 1.5–4.5)
Glucose: 109 mg/dL — ABNORMAL HIGH (ref 70–99)
Potassium: 5.5 mmol/L — ABNORMAL HIGH (ref 3.5–5.2)
Sodium: 142 mmol/L (ref 134–144)
Total Protein: 7.2 g/dL (ref 6.0–8.5)
eGFR: 56 mL/min/{1.73_m2} — ABNORMAL LOW (ref >59)

## 2022-11-15 LAB — TSH: TSH: 3.11 u[IU]/mL (ref 0.450–4.500)

## 2022-11-15 LAB — CORTISOL-AM, BLOOD: Cortisol - AM: 8.4 ug/dL (ref 6.2–19.4)

## 2022-11-15 LAB — T4, FREE: Free T4: 1.33 ng/dL (ref 0.82–1.77)

## 2022-11-26 ENCOUNTER — Ambulatory Visit: Payer: Medicare HMO | Admitting: Nurse Practitioner

## 2022-11-27 ENCOUNTER — Encounter: Payer: Self-pay | Admitting: Nurse Practitioner

## 2022-11-27 ENCOUNTER — Ambulatory Visit: Payer: Medicare HMO | Attending: Nurse Practitioner | Admitting: Nurse Practitioner

## 2022-11-27 VITALS — BP 170/90 | HR 56 | Ht 62.0 in | Wt 126.0 lb

## 2022-11-27 DIAGNOSIS — R0602 Shortness of breath: Secondary | ICD-10-CM

## 2022-11-27 DIAGNOSIS — I779 Disorder of arteries and arterioles, unspecified: Secondary | ICD-10-CM

## 2022-11-27 DIAGNOSIS — Z720 Tobacco use: Secondary | ICD-10-CM | POA: Diagnosis not present

## 2022-11-27 DIAGNOSIS — I10 Essential (primary) hypertension: Secondary | ICD-10-CM | POA: Diagnosis not present

## 2022-11-27 DIAGNOSIS — I714 Abdominal aortic aneurysm, without rupture, unspecified: Secondary | ICD-10-CM | POA: Diagnosis not present

## 2022-11-27 DIAGNOSIS — E785 Hyperlipidemia, unspecified: Secondary | ICD-10-CM

## 2022-11-27 DIAGNOSIS — I7143 Infrarenal abdominal aortic aneurysm, without rupture: Secondary | ICD-10-CM | POA: Diagnosis not present

## 2022-11-27 DIAGNOSIS — I25119 Atherosclerotic heart disease of native coronary artery with unspecified angina pectoris: Secondary | ICD-10-CM

## 2022-11-27 DIAGNOSIS — I2510 Atherosclerotic heart disease of native coronary artery without angina pectoris: Secondary | ICD-10-CM

## 2022-11-27 MED ORDER — ATORVASTATIN CALCIUM 80 MG PO TABS
80.0000 mg | ORAL_TABLET | Freq: Every day | ORAL | 1 refills | Status: DC
Start: 2022-11-27 — End: 2023-12-29

## 2022-11-27 NOTE — Patient Instructions (Addendum)
Medication Instructions:  Your physician has recommended you make the following change in your medication:  Please Increase  atorvastatin (LIPITOR) to 80 MG tablet   Labwork: In 2 Months   Testing/Procedures: Your physician has requested that you have a carotid duplex. This test is an ultrasound of the carotid arteries in your neck. It looks at blood flow through these arteries that supply the brain with blood. Allow one hour for this exam. There are no restrictions or special instructions. Your physician has requested that you have an abdominal aorta duplex. During this test, an ultrasound is used to evaluate the aorta. Allow 30 minutes for this exam. Do not eat after midnight the day before and avoid carbonated beverages  Follow-Up: Your physician recommends that you schedule a follow-up appointment in:  6 Months JB 2 week nurse visit for BP check   Any Other Special Instructions Will Be Listed Below (If Applicable).  If you need a refill on your cardiac medications before your next appointment, please call your pharmacy.

## 2022-11-27 NOTE — Progress Notes (Unsigned)
**Note De-Identified Kenneth Obfuscation** Cardiology Office Note:    Date:  01/24/2022  ID:  Kenneth Kelly, DOB February 21, 1950, MRN [id number]  PCP:  Kenneth Found, MD    [facility] Providers Cardiologist:  Kenneth Rich, MD Electrophysiologist:  Kenneth Bunting, MD     Referring MD: Kenneth Found, MD   CC: Here for 6 month follow-up  History of Present Illness:    Kenneth Kelly is a 72 y.o. male with a hx of the following:  AAA, infrarenal aortic aneurysm Hypertension CAD, s/p CABG x 3 in 2001 Carotid artery disease Acute CVA Type 2 diabetes Hyperlipidemia Tobacco abuse   Patient is a very pleasant 72 year old male with past medical history mentioned above.  In 2001 he underwent three-vessel CABG with LIMA to Lcx, RIMA to LAD, SVG to RCA.  Had a nuclear stress test in 2018 that was low risk, showed mild apical inferior ischemia.  Echocardiogram in 2022 revealed normal EF, no regional wall motion abnormalities, grade 1 DD, mild RV dysfunction.  History of AAA, this has been closely monitored.  In 2017 AAA measured 3.4 x 3.1 cm.  Had a AAA ultrasound in 2022 that was measured at 4.1 cm.  History of cryptogenic stroke and has been closely followed by EP, said loop recorder placed.  Normal device check in 2022.  Neuro in 2022 recommended DAPT x 3 weeks, then aspirin alone.  It was thought that his carotid artery stenosis did not appear to be the cause of his CVA, it was thought to be embolic event to the right PICA.  Last seen by Dr. Dina Kelly on May 31, 2021.  Was overall doing very well.  Denied any chest pains or shortness of breath.  Was compliant with his medications.  Noted some mild dizziness, remained on Lopressor.  Blood pressure was soft in office that day 104/50, heart rate 56.  Lopressor was decreased to 25 mg twice daily.  It was discussed to follow-up in 6 months.  Since last follow-up visit with Dr. Dina Kelly, he underwent right CEA on 11/13/2021. There was high-grade ICA stenosis  extending from the bifurcation to approximately 2 cm above this.  Tolerated procedure well and transported to PACU in good condition.  He was discharged home on postop day 2 in stable condition.  Today he presents for 65-month follow-up.  He states he is doing well.  He states surrounding his surgery in October, he had a presyncopal episode, vomited, and then felt better.  Denies ever losing consciousness.  Denies any cardiac complaints surrounding the event.  Today he denies any chest pain, shortness of breath, palpitations, syncope, presyncope, dizziness, orthopnea, PND, swelling, significant weight changes, acute bleeding, or claudication.  Smokes 1 and half pack per day.  He is halfway motivated to stop smoking.  In his free time, he enjoys fishing.  He denies any other questions or concerns today.  11/27/2022 - doing well. Seeing a doctor for diabetes, endocrinologist. No chest pain. Phlegm. No shortness of breath. About the same with smoking.    Carotid artery duplex. - due for it, repeat AAA duplex   Increase atorvastatin to 80 mg daily, 2 months later, recheck FLP, LFT.    6 month f/u him   Past Medical History:  Diagnosis Date   Abdominal aortic aneurysm (AAA) [facility]) followed by cardiologist-- dr Kenneth Kelly   per last CT 01-12-2016  Infrarenal aortic aneurysm 3.4cm   Arthritis    Hip   Bladder tumor    CAD (coronary artery disease)  CARDIOLOGIST-  DR Kenneth Kelly---  HX CABG X3 08-15-1999   First degree AV block    Gout    History of bladder cancer urologist-  dr Kenneth Kelly   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   History of fracture of pelvis    Hyperlipidemia 02/21/2009   Qualifier: Diagnosis of  By: Kenneth Kelly     Hypertension    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   LIMA to LCFx, SVG to RCA, RIMA to LAD   Sinus bradycardia on ECG    Stroke [facility]) 2022   Type 2 diabetes mellitus (HCC)     Past Surgical History:  Procedure Laterality Date   CARDIAC CATHETERIZATION   07-20-1999  dr wall   significant 2v CAD, total occlusion LAD an 80% LCFx neither amenable to PCI   CARDIOVASCULAR STRESS TEST  04-23-2016  dr Kenneth Kelly ([place])   Low risk nuclear study w/ small, mild intensity, reversible mid to apical inferior defect that is consistent to ischemia but no diagnotic ST segment changes to indicate ischemia/  normal LV function and wall motion ,  nuclear stress ef 57%   CORONARY ARTERY BYPASS GRAFT  08/15/1999   dr gerhardt [facility]   x3, LIMA to left circumflex, SVG to RCA, and right internal mammary artery graft to the LAD   CYSTOSCOPY WITH BIOPSY N/A 04/08/2017   Procedure: CYSTOSCOPY;  Surgeon: Kenneth Pippin, MD;  Location: [facility];  Service: Urology;  Laterality: N/A;   CYSTOSCOPY WITH STENT PLACEMENT Right 01/23/2016   Procedure: CYSTOSCOPY WITH STENT PLACEMENT;  Surgeon: Kenneth Pippin, MD;  Location: [facility];  Service: Urology;  Laterality: Right;   ENDARTERECTOMY Right 11/13/2021   Procedure: ENDARTERECTOMY CAROTID;  Surgeon: Kenneth Harman, MD;  Location: [facility] OR;  Service: Vascular;  Laterality: Right;   KNEE ARTHROSCOPY Right 2001  approx.   LOOP RECORDER INSERTION N/A 07/24/2020   Procedure: LOOP RECORDER INSERTION;  Surgeon: Kenneth Maw, MD;  Location: [facility] INVASIVE CV LAB;  Service: Cardiovascular;  Laterality: N/A;   PATCH ANGIOPLASTY Right 11/13/2021   Procedure: PATCH ANGIOPLASTY USING 1CM X 6CM BOVINE PATCH;  Surgeon: Kenneth Harman, MD;  Location: [facility] [place] [facility];  Service: Vascular;  Laterality: Right;   POSTERIOR LAMINECTOMY / DECOMPRESSION LUMBAR SPINE  09-01-2009   [facility]   TRANSURETHRAL RESECTION OF BLADDER TUMOR WITH MITOMYCIN-C Bilateral 01/23/2016   Procedure: CYSTOSCOPY ,TRANSURETHRAL RESECTION OF BLADDER TUMOR;  Surgeon: Kenneth Pippin, MD;  Location: [facility];  Service: Urology;  Laterality: Bilateral;    Current Medications: No outpatient medications have been marked as taking for the 11/27/22 encounter  (Appointment) with Kenneth Dory, NP.     Allergies:   Patient has no known allergies.   Social History   Socioeconomic History   Marital status: Divorced    Spouse name: Not on file   Number of children: Not on file   Years of education: Not on file   Highest education level: Not on file  Occupational History   Not on file  Tobacco Use   Smoking status: Every Day    Current packs/day: 1.00    Average packs/day: 1 pack/day for 56.0 years (56.0 ttl pk-yrs)    Types: Cigarettes   Smokeless tobacco: Never  Vaping Use   Vaping status: Never Used  Substance and Sexual Activity   Alcohol use: Not Currently    Comment: rare   Drug use: No   Sexual activity: Yes  Other Topics Concern   Not on  file  Social History Narrative   Not on file   Social Determinants of Health   Financial Resource Strain: Not on file  Food Insecurity: No Food Insecurity (09/04/2020)   Hunger Vital Sign    Worried About Running Out of Food in the Last Year: Never true    Ran Out of Food in the Last Year: Never true  Transportation Needs: No Transportation Needs (09/04/2020)   PRAPARE - Administrator, Civil Service (Medical): No    Lack of Transportation (Non-Medical): No  Physical Activity: Not on file  Stress: Not on file  Social Connections: Not on file     Family History: The patient's family history includes Diabetes in his mother; Heart attack in his brother; Heart disease in his brother, brother, and father; Hypertension in his father; Stroke in his father.  ROS:   Review of Systems  Constitutional: Negative.   HENT: Negative.    Eyes: Negative.   Respiratory: Negative.    Cardiovascular: Negative.   Gastrointestinal: Negative.   Genitourinary: Negative.   Musculoskeletal: Negative.   Skin: Negative.   Neurological: Negative.   Endo/Heme/Allergies: Negative.   Psychiatric/Behavioral: Negative.      Please see the history of present illness.     All other systems  reviewed and are negative.  EKGs/Labs/Other Studies Reviewed:    The following studies were reviewed today:   EKG:  EKG is not ordered today.  ABI's on 10/10/2021: Summary:  Right: Resting right ankle-brachial index indicates moderate right lower  extremity arterial disease. The right toe-brachial index is abnormal.   Left: Resting left ankle-brachial index indicates moderate left lower  extremity arterial disease. The left toe-brachial index is abnormal.  Bilateral carotid duplex on September 12, 2021: Summary:  Right Carotid: Velocities in the right ICA are consistent with a 80-99%                 stenosis.   Left Carotid: Velocities in the left ICA are consistent with a 60-79%  stenosis.   Vertebrals: Bilateral vertebral arteries demonstrate antegrade flow.  Subclavians: Normal flow hemodynamics were seen in bilateral subclavian               arteries.    AAA vascular ultrasound on September 12, 2021: Summary:  Abdominal Aorta: There is evidence of abnormal dilatation of the mid and  distal Abdominal aorta. There is evidence of abnormal dilation of the Left  Common Iliac artery. The largest aortic measurement is 4.2 cm. The largest  aortic diameter remains  essentially unchanged compared to prior exam. Previous diameter  measurement was obtained on 09/19/20.  Echocardiogram on July 22, 2020: 1. Left ventricular ejection fraction, by estimation, is 60 to 65%. The  left ventricle has normal function. The left ventricle has no regional  wall motion abnormalities. There is moderate asymmetric left ventricular  hypertrophy of the basal-septal  segment. Left ventricular diastolic parameters are consistent with Grade I  diastolic dysfunction (impaired relaxation).   2. Right ventricular systolic function is mildly reduced. The right  ventricular size is mildly enlarged. Tricuspid regurgitation signal is  inadequate for assessing PA pressure.   3. Left atrial size was moderately  dilated.   4. The mitral valve is abnormal. Trivial mitral valve regurgitation.   5. The aortic valve is tricuspid. Aortic valve regurgitation is not  visualized. Mild aortic valve sclerosis is present, with no evidence of  aortic valve stenosis. Aortic valve mean gradient measures 3.5 mmHg.  6. The inferior vena cava is normal in size with <50% respiratory  variability, suggesting right atrial pressure of 8 mmHg.   7. Agitated saline contrast bubble study was negative, with no evidence  of any interatrial shunt.   Comparison(s): No prior Echocardiogram.  Myoview on April 23, 2016: No diagnostic ST segment changes to indicate ischemia. Small, mild intensity, reversible mid to apical inferior defect that is consistent with ischemia. This is a low risk study. Nuclear stress EF: 57%.   Recent Labs: 11/14/2022: ALT 13; BUN 35; Creatinine, Ser 1.33; Potassium 5.5; Sodium 142; TSH 3.110  Recent Lipid Panel    Component Value Date/Time   CHOL 166 11/14/2022 0747   TRIG 99 11/14/2022 0747   HDL 49 11/14/2022 0747   CHOLHDL 3.4 11/14/2022 0747   CHOLHDL 3.2 11/14/2021 0341   VLDL 7 11/14/2021 0341   LDLCALC 99 11/14/2022 0747    Physical Exam:    VS:  There were no vitals taken for this visit.    Wt Readings from Last 3 Encounters:  08/22/22 123 lb 9.6 oz (56.1 kg)  07/24/22 126 lb (57.2 kg)  01/24/22 125 lb 12.8 oz (57.1 kg)     GEN: Well nourished, well developed in no acute distress HEENT: Normal NECK: No JVD; No carotid bruits CARDIAC: S1/S2, RRR, no murmurs, rubs, gallops; 2+ peripheral pulses throughout, strong bilaterally RESPIRATORY:  Clear and diminished to auscultation without rales, wheezing or rhonchi  MUSCULOSKELETAL:  No edema; No deformity  SKIN: Warm and dry NEUROLOGIC:  Alert and oriented x 3 PSYCHIATRIC:  Normal affect   ASSESSMENT:    No diagnosis [facility].  PLAN:    In order of problems listed above:  CAD, s/p CABG x 3 in 2001 Stable with no  anginal symptoms. No indication for ischemic evaluation.  Continue aspirin, Lipitor, Plavix, Lopressor, lisinopril, and nitroglycerin as needed. Heart healthy diet and regular cardiovascular exercise encouraged.   Carotid artery disease, s/p R CEA in 11/2021, presycnope He is followed by vascular surgery.  Underwent right CEA in October 2023.  He has pending carotid duplex ordered, but it appears it has not been arranged.  Continue to follow-up with [facility].  Had presyncopal episode surrounding after surgery. Remote device check around that time normal. Most likely d/t dehydration from vomiting, no recurrent episodes. Will continue to monitor. Continue current medication regimen. Heart healthy diet and regular cardiovascular exercise encouraged.   HLD Labs from October 2023 revealed total cholesterol 104, HDL 33, LDL 64, and triglycerides 36.  Continue current medication regimen. Heart healthy diet and regular cardiovascular exercise encouraged.   AAA, infrarenal aortic aneurysm He is being followed by vascular surgery for this.  Last AAA duplex 09/2021 revealed stable mild AAA, previous measurement of infrarenal aortic aneurysm 3.4 cm.  There was evidence of abnormal dilatation of left common iliac artery.  The largest aortic measurement was 4.2 cm.  Stable.   Continue follow-up with [facility]. Heart healthy diet and regular cardiovascular exercise encouraged.   HTN Blood pressure stable today.  BP well-controlled at home.  Continue current medication regimen. Discussed to monitor BP at home at least 2 hours after medications and sitting for 5-10 minutes. Heart healthy diet and regular cardiovascular exercise encouraged.   Tobacco abuse Smoking 1.5 PPD.  Smoking cessation encouraged and discussed.  Will write prescription for Nicotine patch 21 mg daily x 30 days, and he will contact our office for a refill when needed.   7.  Disposition: Follow-up with Dr. Christiane Ha  Branch in 6 months or sooner if anything  changes.   Medication Adjustments/Labs and Tests Ordered: Current medicines are reviewed at length with the patient today.  Concerns regarding medicines are outlined above.  No orders of the defined types were placed in this encounter.  No orders of the defined types were placed in this encounter.   There are no Patient Instructions on file for this visit.   Signed, Kenneth Dory, NP  11/27/2022 9:34 AM    [facility]

## 2022-11-28 ENCOUNTER — Encounter: Payer: Self-pay | Admitting: "Endocrinology

## 2022-11-28 ENCOUNTER — Ambulatory Visit: Payer: Medicare HMO | Admitting: "Endocrinology

## 2022-11-28 ENCOUNTER — Encounter: Payer: Self-pay | Admitting: Nurse Practitioner

## 2022-11-28 VITALS — BP 114/66 | HR 64 | Ht 62.0 in | Wt 125.6 lb

## 2022-11-28 DIAGNOSIS — Z7984 Long term (current) use of oral hypoglycemic drugs: Secondary | ICD-10-CM

## 2022-11-28 DIAGNOSIS — E1159 Type 2 diabetes mellitus with other circulatory complications: Secondary | ICD-10-CM | POA: Diagnosis not present

## 2022-11-28 DIAGNOSIS — F17200 Nicotine dependence, unspecified, uncomplicated: Secondary | ICD-10-CM | POA: Diagnosis not present

## 2022-11-28 DIAGNOSIS — E782 Mixed hyperlipidemia: Secondary | ICD-10-CM | POA: Diagnosis not present

## 2022-11-28 DIAGNOSIS — I10 Essential (primary) hypertension: Secondary | ICD-10-CM

## 2022-11-28 DIAGNOSIS — K8681 Exocrine pancreatic insufficiency: Secondary | ICD-10-CM

## 2022-11-28 LAB — POCT GLYCOSYLATED HEMOGLOBIN (HGB A1C): HbA1c, POC (controlled diabetic range): 7 % (ref 0.0–7.0)

## 2022-11-28 NOTE — Progress Notes (Signed)
11/28/2022, 6:34 PM   Endocrinology follow-up note  Subjective:    Patient ID: Kenneth Kelly, male    DOB: [birth year].  Kenneth Kelly is being seen in consultation for management of currently uncontrolled symptomatic diabetes requested by  Assunta Found, MD.   Past Medical History:  Diagnosis Date   Abdominal aortic aneurysm (AAA) [facility]) followed by cardiologist-- dr Purvis Sheffield   per last CT 01-12-2016  Infrarenal aortic aneurysm 3.4cm   Arthritis    Hip   Bladder tumor    CAD (coronary artery disease)    CARDIOLOGIST-  DR Purvis Sheffield---  HX CABG X3 08-15-1999   First degree AV block    Gout    History of bladder cancer urologist-  dr Annabell Howells   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   History of fracture of pelvis    Hyperlipidemia 02/21/2009   Qualifier: Diagnosis of  By: Via LPN, Lynn     Hypertension    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   LIMA to LCFx, SVG to RCA, RIMA to LAD   Sinus bradycardia on ECG    Stroke (HCC) 2022   Type 2 diabetes mellitus (HCC)     Past Surgical History:  Procedure Laterality Date   CARDIAC CATHETERIZATION  07-20-1999  dr wall   significant 2v CAD, total occlusion LAD an 80% LCFx neither amenable to PCI   CARDIOVASCULAR STRESS TEST  04-23-2016  dr Purvis Sheffield ([place])   Low risk nuclear study w/ small, mild intensity, reversible mid to apical inferior defect that is consistent to ischemia but no diagnotic ST segment changes to indicate ischemia/  normal LV function and wall motion ,  nuclear stress ef 57%   CORONARY ARTERY BYPASS GRAFT  08/15/1999   dr gerhardt [facility]   x3, LIMA to left circumflex, SVG to RCA, and right internal mammary artery graft to the LAD   CYSTOSCOPY WITH BIOPSY N/A 04/08/2017   Procedure: CYSTOSCOPY;  Surgeon: Bjorn Pippin, MD;  Location: [facility];  Service: Urology;  Laterality: N/A;   CYSTOSCOPY WITH STENT PLACEMENT Right 01/23/2016   Procedure: CYSTOSCOPY WITH STENT  PLACEMENT;  Surgeon: Bjorn Pippin, MD;  Location: [facility];  Service: Urology;  Laterality: Right;   ENDARTERECTOMY Right 11/13/2021   Procedure: ENDARTERECTOMY CAROTID;  Surgeon: Maeola Harman, MD;  Location: [facility] OR;  Service: Vascular;  Laterality: Right;   KNEE ARTHROSCOPY Right 2001  approx.   LOOP RECORDER INSERTION N/A 07/24/2020   Procedure: LOOP RECORDER INSERTION;  Surgeon: Marinus Maw, MD;  Location: [facility] INVASIVE CV LAB;  Service: Cardiovascular;  Laterality: N/A;   PATCH ANGIOPLASTY Right 11/13/2021   Procedure: PATCH ANGIOPLASTY USING 1CM X 6CM BOVINE PATCH;  Surgeon: Maeola Harman, MD;  Location: [facility] OR;  Service: Vascular;  Laterality: Right;   POSTERIOR LAMINECTOMY / DECOMPRESSION LUMBAR SPINE  09-01-2009   [facility]   TRANSURETHRAL RESECTION OF BLADDER TUMOR WITH MITOMYCIN-C Bilateral 01/23/2016   Procedure: CYSTOSCOPY ,TRANSURETHRAL RESECTION OF BLADDER TUMOR;  Surgeon: Bjorn Pippin, MD;  Location: [facility];  Service: Urology;  Laterality: Bilateral;    Social History   Socioeconomic History   Marital status: Divorced    Spouse name: Not on file   Number of children: Not on file   Years of education: Not on file   Highest education level: Not on file  Occupational History   Not on file  Tobacco Use   Smoking status: Every Day  Current packs/day: 1.00    Average packs/day: 1 pack/day for 56.0 years (56.0 ttl pk-yrs)    Types: Cigarettes   Smokeless tobacco: Never  Vaping Use   Vaping status: Never Used  Substance and Sexual Activity   Alcohol use: Not Currently    Comment: rare   Drug use: No   Sexual activity: Yes  Other Topics Concern   Not on file  Social History Narrative   Not on file   Social Determinants of Health   Financial Resource Strain: Not on file  Food Insecurity: No Food Insecurity (09/04/2020)   Hunger Vital Sign    Worried About Running Out of Food in the Last Year: Never true    Ran Out of Food in the Last Year: Never true   Transportation Needs: No Transportation Needs (09/04/2020)   PRAPARE - Administrator, Civil Service (Medical): No    Lack of Transportation (Non-Medical): No  Physical Activity: Not on file  Stress: Not on file  Social Connections: Not on file    Family History  Problem Relation Age of Onset   Diabetes Mother    Hypertension Father    Heart disease Father    Stroke Father    Heart attack Brother    Heart disease Brother    Heart disease Brother     Outpatient Encounter Medications as of 11/28/2022  Medication Sig   amLODipine (NORVASC) 10 MG tablet Take 10 mg by mouth daily.   aspirin 81 MG EC tablet Take 1 tablet (81 mg total) by mouth daily. Swallow whole.   atorvastatin (LIPITOR) 80 MG tablet Take 1 tablet (80 mg total) by mouth daily.   clopidogrel (PLAVIX) 75 MG tablet Take 75 mg by mouth daily.   colchicine 0.6 MG tablet Take 0.6 mg by mouth as needed.   glipiZIDE (GLUCOTROL XL) 5 MG 24 hr tablet Take 1 tablet (5 mg total) by mouth daily with breakfast.   ibuprofen (ADVIL) 200 MG tablet Take 400 mg by mouth every 6 (six) hours as needed for mild pain or moderate pain.   indomethacin (INDOCIN) 50 MG capsule Take 50 mg by mouth 3 (three) times daily as needed.   lipase/protease/amylase (CREON) 36000 UNITS CPEP capsule Take 1 capsule (36,000 Units total) by mouth 3 (three) times daily with meals AND 1 capsule (36,000 Units total) 3 (three) times daily before meals.   lisinopril (PRINIVIL,ZESTRIL) 10 MG tablet Take 10 mg by mouth every morning.   metFORMIN (GLUCOPHAGE) 500 MG tablet Take 1 tablet (500 mg total) by mouth 2 (two) times daily with a meal.   metoprolol tartrate (LOPRESSOR) 25 MG tablet TAKE 1 TABLET TWICE DAILY (NEED MD APPOINTMENT)   nitroGLYCERIN (NITROSTAT) 0.4 MG SL tablet Place 1 tablet (0.4 mg total) under the tongue every 5 (five) minutes as needed for chest pain.   pantoprazole (PROTONIX) 40 MG tablet Take 40 mg by mouth daily.   simethicone  (MYLICON) 125 MG chewable tablet Chew 125 mg by mouth every 6 (six) hours as needed for flatulence.   No facility-administered encounter medications on file as of 11/28/2022.    ALLERGIES: No Known Allergies  VACCINATION STATUS: Immunization History  Administered Date(s) Administered   Moderna Sars-Covid-2 Vaccination 05/06/2019, 06/03/2019    Diabetes He presents for his follow-up diabetic visit. He has type 2 diabetes mellitus. Onset time: Patient was diagnosed at approximate age of 49 years. His disease course has been improving. There are no hypoglycemic associated symptoms. Pertinent negatives  for hypoglycemia include no confusion, headaches, pallor or seizures. Pertinent negatives for diabetes include no chest pain, no fatigue, no polydipsia, no polyphagia, no polyuria and no weakness. There are no hypoglycemic complications. Symptoms are improving. Diabetic complications include a CVA and heart disease. Risk factors for coronary artery disease include dyslipidemia, male sex, hypertension, sedentary lifestyle, tobacco exposure, family history and diabetes mellitus. His weight is fluctuating minimally (She weighed up to 160 pound in the past, however in the last 5 years he has been progressively losing to current weight of 123 pounds.  He reports diarrhea on system review today.). He is following a generally unhealthy diet. When asked about meal planning, he reported none. He has not had a previous visit with a dietitian. He never participates in exercise. His home blood glucose trend is decreasing steadily. His breakfast blood glucose range is generally 130-140 mg/dl. His bedtime blood glucose range is generally 140-180 mg/dl. His overall blood glucose range is 140-180 mg/dl. (He presents with continued improvement in his glycemic profile with point-of-care A1c was 7%, improving from 8.1%.  He remains on metformin 500 mg p.o. twice daily, and glipizide 5 mg p.o. daily at breakfast. Patient gives  prior history of heavy alcohol use.  He is also heavy smoker 2 packs a day for more than 50 years.) An ACE inhibitor/angiotensin II receptor blocker is being taken. Eye exam is not current.  Hyperlipidemia This is a chronic problem. The current episode started more than 1 year ago. The problem is controlled. Exacerbating diseases include diabetes. Pertinent negatives include no chest pain, myalgias or shortness of breath. Current antihyperlipidemic treatment includes statins. Risk factors for coronary artery disease include dyslipidemia, diabetes mellitus, hypertension, male sex and a sedentary lifestyle.  Hypertension This is a chronic problem. The current episode started more than 1 year ago. The problem is uncontrolled. Pertinent negatives include no chest pain, headaches, neck pain, palpitations or shortness of breath. Risk factors for coronary artery disease include dyslipidemia, diabetes mellitus, male gender, sedentary lifestyle and smoking/tobacco exposure. Past treatments include ACE inhibitors, beta blockers and calcium channel blockers. Hypertensive end-organ damage includes CAD/MI and CVA.    Review of Systems  Constitutional:  Negative for chills, fatigue, fever and unexpected weight change.  HENT:  Negative for dental problem, mouth sores and trouble swallowing.   Eyes:  Negative for visual disturbance.  Respiratory:  Negative for cough, choking, chest tightness, shortness of breath and wheezing.   Cardiovascular:  Negative for chest pain, palpitations and leg swelling.  Gastrointestinal:  Negative for abdominal distention, abdominal pain, constipation, diarrhea, nausea and vomiting.  Endocrine: Negative for polydipsia, polyphagia and polyuria.  Genitourinary:  Negative for dysuria, flank pain, hematuria and urgency.  Musculoskeletal:  Negative for back pain, gait problem, myalgias and neck pain.  Skin:  Negative for pallor, rash and wound.  Neurological:  Negative for seizures,  syncope, weakness, numbness and headaches.  Psychiatric/Behavioral:  Negative for confusion and dysphoric mood.     Objective:       11/28/2022    2:10 PM 11/27/2022   11:25 AM 11/27/2022   11:02 AM  Vitals with BMI  Height 5\' 2"     Weight 125 lbs 10 oz    BMI 22.97    Systolic 114 150 409  Diastolic 66 70 90  Pulse 64      BP 114/66   Pulse 64   Ht 5\' 2"  (1.575 m)   Wt 125 lb 9.6 oz (57 kg)   BMI 22.97  kg/m   Wt Readings from Last 3 Encounters:  11/28/22 125 lb 9.6 oz (57 kg)  11/27/22 126 lb (57.2 kg)  08/22/22 123 lb 9.6 oz (56.1 kg)     CMP ( most recent) CMP     Component Value Date/Time   NA 142 11/14/2022 0747   K 5.5 (H) 11/14/2022 0747   CL 100 11/14/2022 0747   CO2 23 11/14/2022 0747   GLUCOSE 109 (H) 11/14/2022 0747   GLUCOSE 148 (H) 11/15/2021 0712   BUN 35 (H) 11/14/2022 0747   CREATININE 1.33 (H) 11/14/2022 0747   CALCIUM 10.0 11/14/2022 0747   PROT 7.2 11/14/2022 0747   ALBUMIN 4.4 11/14/2022 0747   AST 19 11/14/2022 0747   ALT 13 11/14/2022 0747   ALKPHOS 124 (H) 11/14/2022 0747   BILITOT 0.4 11/14/2022 0747   EGFR 56 (L) 11/14/2022 0747   GFRNONAA >60 11/15/2021 0712     Diabetic Labs (most recent): Lab Results  Component Value Date   HGBA1C 7.0 11/28/2022   HGBA1C 8.1 (A) 07/24/2022   HGBA1C 7.6 (H) 10/23/2021     Lipid Panel ( most recent) Lipid Panel     Component Value Date/Time   CHOL 166 11/14/2022 0747   TRIG 99 11/14/2022 0747   HDL 49 11/14/2022 0747   CHOLHDL 3.4 11/14/2022 0747   CHOLHDL 3.2 11/14/2021 0341   VLDL 7 11/14/2021 0341   LDLCALC 99 11/14/2022 0747   LABVLDL 18 11/14/2022 0747      Assessment & Plan:   1. DM type 2 causing vascular disease (HCC)   - Kenneth Kelly has currently uncontrolled symptomatic type 2 DM since  72 years of age.  He presents with continued improvement in his glycemic profile with point-of-care A1c was 7%, improving from 8.1%.  He remains on metformin 500 mg p.o. twice  daily, and glipizide 5 mg p.o. daily at breakfast. Patient gives prior history of heavy alcohol use.  He is also heavy smoker 2 packs a day for more than 50 years.  -Recent labs reviewed. - I had a long discussion with him about the possible risk factors and  the pathology behind its diabetes and its complications. -He does not have significant history of obesity.  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  -his diabetes is complicated by coronary artery disease which required open heart surgery in 2020, CVA, comorbid hyperlipidemia/hypertension, prior heavy drinking likely indicating pancreatic failure not only for beta cell function but  also for pancreatic alpha cells.   Heavy smoking and he remains at exceedingly high risk for more acute and chronic complications which include CAD, CVA, CKD, retinopathy, and neuropathy. These are all discussed in detail with him.  - I discussed all available options of managing his diabetes including de-escalation of medications. I have counseled him on Food as Medicine . - Patient is encouraged to switch to  unprocessed or minimally processed  complex starch, adequate protein intake (mainly plant source), minimal liquid fat, plenty of fruits, and vegetables. -  he is advised to stick to a routine mealtimes to eat 3 complete meals a day and snack only when necessary ( to snack only to correct hypoglycemia BG <70 day time or <100 at night).   - he acknowledges that there is a room for improvement in his food and drink choices. - Further Specific Suggestion is made for him to avoid simple carbohydrates  from his diet including Cakes, Sweet Desserts,  Ice Cream, Soda (diet and regular), Sweet Tea, Candies, Chips, Cookies, Store Bought Juices, Alcohol ,  Artificial Sweeteners,  Coffee Creamer, and "Sugar-free" Products. This will help patient to have more stable blood glucose profile and potentially avoid  unintended weight gain.  - he will be scheduled with Norm Salt, RDN, CDE for individualized diabetes education.  - I have approached him with the following individualized plan to manage  his diabetes and patient agrees:   -Given his presentation with near target glycemic profile, and point-of-care A1c of 7%, he will not need insulin treatment for now.    - He is approached and is willing to continue monitoring blood glucose at least twice a day-daily before breakfast and at bedtime.    -He is advised to continue metformin  500 mg p.o. twice daily.  He is advised to continue glipizide 5 mg XL p.o. daily at breakfast.   -His diabetes is likely  induced by pancreatic damage from EtOH which may make it necessary for him to need insulin treatment later.    -Due to his presumptive diagnosis of exocrine pancreatic insufficiency with unintended weight loss and diarrhea, he was given a prescription for Creon 36 units 3 times a day.  He is responding to this intervention with resolution of diarrhea and stabilizing his weight.    He is encouraged to call clinic for hypoglycemia below 70 or hyperglycemia above 300.  He is not a candidate for GLP-1 receptor agonists nor SGLT2 inhibitors because of his body habitus.   - Specific targets for  A1c;  LDL, HDL,  and Triglycerides were discussed with the patient.  2) Blood Pressure /Hypertension:    -His blood pressure is controlled to target.   he is advised to continue his current medications including lisinopril 10 mg p.o. daily, amlodipine 10 mg p.o. daily, metoprolol 25 p.o. twice daily .   3) Lipids/Hyperlipidemia:   Review of his recent labs show LDL at 99.  He will continue to benefit from atorvastatin 40 mg p.o. nightly.  Side effects and precautions discussed with him.  4)  Weight/Diet:  Body mass index is 22.97 kg/m.  -    he is not a candidate for weight loss.    5) exocrine pancreatic insufficiency: He would benefit from Creon  intervention to assist him with the digestion and absorption of his nutrients.  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 6) Chronic Care/Health Maintenance:  -he  is on ACEI/ARB and Statin medications and  is encouraged to initiate and continue to follow up with Ophthalmology, Dentist,  Podiatrist at least yearly or according to recommendations, and advised to  quit smoking. I have recommended yearly flu vaccine and pneumonia vaccine at least every 5 years; moderate intensity exercise for up to 150 minutes weekly; and  sleep for 7- 9 hours a day.  The patient was counseled on the dangers of tobacco use, and was advised to quit.  Reviewed strategies to maximize success, including removing cigarettes and smoking materials from environment.   - he is  advised to maintain close follow up with Assunta Found, MD for primary care needs, as well as his other providers for optimal and coordinated care.   I spent  42  minutes in the care of the patient today including review of labs from CMP, Lipids, Thyroid Function, Hematology (current and previous including abstractions from other facilities); face-to-face time discussing  his blood glucose readings/logs, discussing hypoglycemia and hyperglycemia episodes  and symptoms, medications doses, his options of short and long term treatment based on the latest standards of care / guidelines;  discussion about incorporating lifestyle medicine;  and documenting the encounter. Risk reduction counseling performed per USPSTF guidelines to reduce cardiovascular risk factors.     Please refer to Patient Instructions for Blood Glucose Monitoring and Insulin/Medications Dosing Guide"  in media tab for additional information. Please  also refer to " Patient Self Inventory" in the Media  tab for reviewed elements of pertinent patient history.  Kenneth Kelly participated in the discussions, expressed understanding, and  voiced agreement with the above plans.  All questions were answered to his satisfaction. he is encouraged to contact clinic should he have any questions or concerns prior to his return visit.   Follow up plan: - Return in about 6 months (around 05/29/2023) for F/U with Pre-visit Labs, Meter/CGM/Logs, A1c here.  Marquis Lunch, MD [facility] [address] [place], [place] [address] Phone: [phone]  Fax: [phone]    11/28/2022, 6:34 PM  This note was partially dictated with voice recognition software. Similar sounding words can be transcribed inadequately or may not  be corrected upon review.

## 2022-12-02 ENCOUNTER — Ambulatory Visit (INDEPENDENT_AMBULATORY_CARE_PROVIDER_SITE_OTHER): Payer: Medicare HMO

## 2022-12-02 DIAGNOSIS — I639 Cerebral infarction, unspecified: Secondary | ICD-10-CM

## 2022-12-03 LAB — CUP PACEART REMOTE DEVICE CHECK
Date Time Interrogation Session: 20241027230558
Implantable Pulse Generator Implant Date: 20220620

## 2022-12-07 ENCOUNTER — Other Ambulatory Visit: Payer: Self-pay | Admitting: Cardiology

## 2022-12-16 ENCOUNTER — Ambulatory Visit (INDEPENDENT_AMBULATORY_CARE_PROVIDER_SITE_OTHER): Payer: Medicare HMO

## 2022-12-16 ENCOUNTER — Ambulatory Visit: Payer: Medicare HMO | Attending: Nurse Practitioner

## 2022-12-16 VITALS — BP 126/64 | HR 53 | Ht 62.0 in | Wt 126.4 lb

## 2022-12-16 DIAGNOSIS — I10 Essential (primary) hypertension: Secondary | ICD-10-CM | POA: Diagnosis not present

## 2022-12-16 DIAGNOSIS — E785 Hyperlipidemia, unspecified: Secondary | ICD-10-CM | POA: Diagnosis not present

## 2022-12-16 DIAGNOSIS — I2510 Atherosclerotic heart disease of native coronary artery without angina pectoris: Secondary | ICD-10-CM | POA: Diagnosis not present

## 2022-12-16 DIAGNOSIS — I779 Disorder of arteries and arterioles, unspecified: Secondary | ICD-10-CM

## 2022-12-16 DIAGNOSIS — I6503 Occlusion and stenosis of bilateral vertebral arteries: Secondary | ICD-10-CM

## 2022-12-16 DIAGNOSIS — I714 Abdominal aortic aneurysm, without rupture, unspecified: Secondary | ICD-10-CM

## 2022-12-16 NOTE — Progress Notes (Signed)
Patient in today for BP check. Reported no SOB or chest pains. He reported that he has been checking his BP at home, but doesn't recall what they where. Took his medications this morning.  BP-126/64

## 2022-12-20 NOTE — Progress Notes (Signed)
Carelink Summary Report / Loop Recorder 

## 2022-12-25 NOTE — Progress Notes (Signed)
BP looks fine, no changes  Dominga Ferry MD

## 2023-01-05 LAB — CUP PACEART REMOTE DEVICE CHECK
Date Time Interrogation Session: 20241130230125
Implantable Pulse Generator Implant Date: 20220620

## 2023-01-06 ENCOUNTER — Ambulatory Visit (INDEPENDENT_AMBULATORY_CARE_PROVIDER_SITE_OTHER): Payer: Medicare HMO

## 2023-01-06 DIAGNOSIS — I639 Cerebral infarction, unspecified: Secondary | ICD-10-CM

## 2023-01-14 ENCOUNTER — Encounter: Payer: Self-pay | Admitting: Internal Medicine

## 2023-02-10 ENCOUNTER — Ambulatory Visit: Payer: Medicare HMO

## 2023-02-10 DIAGNOSIS — I639 Cerebral infarction, unspecified: Secondary | ICD-10-CM

## 2023-02-10 LAB — CUP PACEART REMOTE DEVICE CHECK
Date Time Interrogation Session: 20250105230752
Implantable Pulse Generator Implant Date: 20220620

## 2023-03-17 ENCOUNTER — Ambulatory Visit (INDEPENDENT_AMBULATORY_CARE_PROVIDER_SITE_OTHER): Payer: Medicare HMO

## 2023-03-17 DIAGNOSIS — I639 Cerebral infarction, unspecified: Secondary | ICD-10-CM | POA: Diagnosis not present

## 2023-03-17 LAB — CUP PACEART REMOTE DEVICE CHECK
Date Time Interrogation Session: 20250209230918
Implantable Pulse Generator Implant Date: 20220620

## 2023-03-21 NOTE — Progress Notes (Signed)
[facility] Summary Report / Loop Recorder

## 2023-03-23 ENCOUNTER — Encounter: Payer: Self-pay | Admitting: Internal Medicine

## 2023-04-02 DIAGNOSIS — F0390 Unspecified dementia without behavioral disturbance: Secondary | ICD-10-CM | POA: Diagnosis not present

## 2023-04-02 DIAGNOSIS — I509 Heart failure, unspecified: Secondary | ICD-10-CM | POA: Diagnosis not present

## 2023-04-02 DIAGNOSIS — I714 Abdominal aortic aneurysm, without rupture, unspecified: Secondary | ICD-10-CM | POA: Diagnosis not present

## 2023-04-02 DIAGNOSIS — Z91198 Patient's noncompliance with other medical treatment and regimen for other reason: Secondary | ICD-10-CM | POA: Diagnosis not present

## 2023-04-02 DIAGNOSIS — I5030 Unspecified diastolic (congestive) heart failure: Secondary | ICD-10-CM | POA: Diagnosis not present

## 2023-04-02 DIAGNOSIS — J811 Chronic pulmonary edema: Secondary | ICD-10-CM | POA: Diagnosis not present

## 2023-04-02 DIAGNOSIS — Z79899 Other long term (current) drug therapy: Secondary | ICD-10-CM | POA: Diagnosis not present

## 2023-04-02 DIAGNOSIS — I6782 Cerebral ischemia: Secondary | ICD-10-CM | POA: Diagnosis not present

## 2023-04-02 DIAGNOSIS — I110 Hypertensive heart disease with heart failure: Secondary | ICD-10-CM | POA: Diagnosis not present

## 2023-04-02 DIAGNOSIS — E119 Type 2 diabetes mellitus without complications: Secondary | ICD-10-CM | POA: Diagnosis not present

## 2023-04-02 DIAGNOSIS — Z7902 Long term (current) use of antithrombotics/antiplatelets: Secondary | ICD-10-CM | POA: Diagnosis not present

## 2023-04-02 DIAGNOSIS — I6523 Occlusion and stenosis of bilateral carotid arteries: Secondary | ICD-10-CM | POA: Diagnosis not present

## 2023-04-02 DIAGNOSIS — Z91148 Patient's other noncompliance with medication regimen for other reason: Secondary | ICD-10-CM | POA: Diagnosis not present

## 2023-04-02 DIAGNOSIS — I63532 Cerebral infarction due to unspecified occlusion or stenosis of left posterior cerebral artery: Secondary | ICD-10-CM | POA: Diagnosis not present

## 2023-04-02 DIAGNOSIS — R079 Chest pain, unspecified: Secondary | ICD-10-CM | POA: Diagnosis not present

## 2023-04-02 DIAGNOSIS — R4182 Altered mental status, unspecified: Secondary | ICD-10-CM | POA: Diagnosis not present

## 2023-04-02 DIAGNOSIS — Z720 Tobacco use: Secondary | ICD-10-CM | POA: Diagnosis not present

## 2023-04-02 DIAGNOSIS — I340 Nonrheumatic mitral (valve) insufficiency: Secondary | ICD-10-CM | POA: Diagnosis not present

## 2023-04-02 DIAGNOSIS — I2510 Atherosclerotic heart disease of native coronary artery without angina pectoris: Secondary | ICD-10-CM | POA: Diagnosis not present

## 2023-04-02 DIAGNOSIS — Z8673 Personal history of transient ischemic attack (TIA), and cerebral infarction without residual deficits: Secondary | ICD-10-CM | POA: Diagnosis not present

## 2023-04-02 DIAGNOSIS — I10 Essential (primary) hypertension: Secondary | ICD-10-CM | POA: Diagnosis not present

## 2023-04-02 DIAGNOSIS — R0989 Other specified symptoms and signs involving the circulatory and respiratory systems: Secondary | ICD-10-CM | POA: Diagnosis not present

## 2023-04-02 DIAGNOSIS — R931 Abnormal findings on diagnostic imaging of heart and coronary circulation: Secondary | ICD-10-CM | POA: Diagnosis not present

## 2023-04-02 DIAGNOSIS — R410 Disorientation, unspecified: Secondary | ICD-10-CM | POA: Diagnosis not present

## 2023-04-02 DIAGNOSIS — I6389 Other cerebral infarction: Secondary | ICD-10-CM | POA: Diagnosis not present

## 2023-04-02 DIAGNOSIS — I639 Cerebral infarction, unspecified: Secondary | ICD-10-CM | POA: Diagnosis not present

## 2023-04-02 DIAGNOSIS — Z7982 Long term (current) use of aspirin: Secondary | ICD-10-CM | POA: Diagnosis not present

## 2023-04-02 DIAGNOSIS — I5032 Chronic diastolic (congestive) heart failure: Secondary | ICD-10-CM | POA: Diagnosis not present

## 2023-04-03 DIAGNOSIS — I639 Cerebral infarction, unspecified: Secondary | ICD-10-CM | POA: Diagnosis not present

## 2023-04-03 DIAGNOSIS — I6782 Cerebral ischemia: Secondary | ICD-10-CM | POA: Diagnosis not present

## 2023-04-03 DIAGNOSIS — Z8673 Personal history of transient ischemic attack (TIA), and cerebral infarction without residual deficits: Secondary | ICD-10-CM | POA: Diagnosis not present

## 2023-04-03 DIAGNOSIS — R410 Disorientation, unspecified: Secondary | ICD-10-CM | POA: Diagnosis not present

## 2023-04-05 DIAGNOSIS — I2510 Atherosclerotic heart disease of native coronary artery without angina pectoris: Secondary | ICD-10-CM | POA: Diagnosis not present

## 2023-04-05 DIAGNOSIS — Z7902 Long term (current) use of antithrombotics/antiplatelets: Secondary | ICD-10-CM | POA: Diagnosis not present

## 2023-04-05 DIAGNOSIS — Z7984 Long term (current) use of oral hypoglycemic drugs: Secondary | ICD-10-CM | POA: Diagnosis not present

## 2023-04-05 DIAGNOSIS — E119 Type 2 diabetes mellitus without complications: Secondary | ICD-10-CM | POA: Diagnosis not present

## 2023-04-05 DIAGNOSIS — F17210 Nicotine dependence, cigarettes, uncomplicated: Secondary | ICD-10-CM | POA: Diagnosis not present

## 2023-04-05 DIAGNOSIS — Z556 Problems related to health literacy: Secondary | ICD-10-CM | POA: Diagnosis not present

## 2023-04-05 DIAGNOSIS — I10 Essential (primary) hypertension: Secondary | ICD-10-CM | POA: Diagnosis not present

## 2023-04-05 DIAGNOSIS — Z604 Social exclusion and rejection: Secondary | ICD-10-CM | POA: Diagnosis not present

## 2023-04-05 DIAGNOSIS — Z7982 Long term (current) use of aspirin: Secondary | ICD-10-CM | POA: Diagnosis not present

## 2023-04-08 NOTE — Care Plan (Signed)
 Transition of Care Encounter Data   Call attempt: 2 Admission date: 04/03/23 Discharge date: 04/04/23 Discharge diagnosis: Cerebrovascular accident (CVA Do you have a hospital follow up appointment?: Yes - Within 14 Days, Yes with Primary PCP clinic F/U Date: 04/09/23 Patient post discharge: Are you able to make it to your f/u appt.?: Yes Since your discharge, are your symptoms better, worse, or the same?: Same Have you developed any new symptoms?: No Is there someone to help you at home?: Yes  Helper: Child  Are there questions we can help clarify before your next appointment?: No Medications:      Were you able to pick up all of your newly prescribed medications (and/or any necessary refills)?: Yes  Were medications prescribed or ordered upon discharge reviewed today with the most recent outpatient medication list?: Yes Patient was reminded to bring all of their medication bottles to their follow up appointment: Yes If Home Health Services or Medical Equipment was ordered, has it arrived?: Yes Was the patients f/u appt. date/time and location confirmed with the patient?: Yes Remind Patients: If patient has a non-emergent medical problem, they may contact a nurse 24/7 or patient may call their provider's clinic. If experiencing a medical emergency, patient should call 911: Yes .   [facility]: [phone]:  .  [facility]: [phone]:  .  Other: Contact PCP:      Nurse spoke with pt's sister in law, per chart providers and staff spoke with pt's sister in law.        Kenneth CHRISTELLA Rea, RN

## 2023-04-09 DIAGNOSIS — Z6821 Body mass index (BMI) 21.0-21.9, adult: Secondary | ICD-10-CM | POA: Diagnosis not present

## 2023-04-09 DIAGNOSIS — F17290 Nicotine dependence, other tobacco product, uncomplicated: Secondary | ICD-10-CM | POA: Diagnosis not present

## 2023-04-09 DIAGNOSIS — I2510 Atherosclerotic heart disease of native coronary artery without angina pectoris: Secondary | ICD-10-CM | POA: Diagnosis not present

## 2023-04-09 DIAGNOSIS — I639 Cerebral infarction, unspecified: Secondary | ICD-10-CM | POA: Diagnosis not present

## 2023-04-09 DIAGNOSIS — E782 Mixed hyperlipidemia: Secondary | ICD-10-CM | POA: Diagnosis not present

## 2023-04-10 DIAGNOSIS — I10 Essential (primary) hypertension: Secondary | ICD-10-CM | POA: Diagnosis not present

## 2023-04-10 DIAGNOSIS — Z556 Problems related to health literacy: Secondary | ICD-10-CM | POA: Diagnosis not present

## 2023-04-10 DIAGNOSIS — I2510 Atherosclerotic heart disease of native coronary artery without angina pectoris: Secondary | ICD-10-CM | POA: Diagnosis not present

## 2023-04-10 DIAGNOSIS — Z7982 Long term (current) use of aspirin: Secondary | ICD-10-CM | POA: Diagnosis not present

## 2023-04-10 DIAGNOSIS — E119 Type 2 diabetes mellitus without complications: Secondary | ICD-10-CM | POA: Diagnosis not present

## 2023-04-10 DIAGNOSIS — Z604 Social exclusion and rejection: Secondary | ICD-10-CM | POA: Diagnosis not present

## 2023-04-10 DIAGNOSIS — F17210 Nicotine dependence, cigarettes, uncomplicated: Secondary | ICD-10-CM | POA: Diagnosis not present

## 2023-04-10 DIAGNOSIS — Z7984 Long term (current) use of oral hypoglycemic drugs: Secondary | ICD-10-CM | POA: Diagnosis not present

## 2023-04-10 DIAGNOSIS — Z7902 Long term (current) use of antithrombotics/antiplatelets: Secondary | ICD-10-CM | POA: Diagnosis not present

## 2023-04-15 DIAGNOSIS — I2510 Atherosclerotic heart disease of native coronary artery without angina pectoris: Secondary | ICD-10-CM | POA: Diagnosis not present

## 2023-04-15 DIAGNOSIS — E119 Type 2 diabetes mellitus without complications: Secondary | ICD-10-CM | POA: Diagnosis not present

## 2023-04-15 DIAGNOSIS — Z7984 Long term (current) use of oral hypoglycemic drugs: Secondary | ICD-10-CM | POA: Diagnosis not present

## 2023-04-15 DIAGNOSIS — Z556 Problems related to health literacy: Secondary | ICD-10-CM | POA: Diagnosis not present

## 2023-04-15 DIAGNOSIS — Z7982 Long term (current) use of aspirin: Secondary | ICD-10-CM | POA: Diagnosis not present

## 2023-04-15 DIAGNOSIS — F17210 Nicotine dependence, cigarettes, uncomplicated: Secondary | ICD-10-CM | POA: Diagnosis not present

## 2023-04-15 DIAGNOSIS — Z7902 Long term (current) use of antithrombotics/antiplatelets: Secondary | ICD-10-CM | POA: Diagnosis not present

## 2023-04-15 DIAGNOSIS — I10 Essential (primary) hypertension: Secondary | ICD-10-CM | POA: Diagnosis not present

## 2023-04-15 DIAGNOSIS — Z604 Social exclusion and rejection: Secondary | ICD-10-CM | POA: Diagnosis not present

## 2023-04-16 DIAGNOSIS — E119 Type 2 diabetes mellitus without complications: Secondary | ICD-10-CM | POA: Diagnosis not present

## 2023-04-16 DIAGNOSIS — I10 Essential (primary) hypertension: Secondary | ICD-10-CM | POA: Diagnosis not present

## 2023-04-16 DIAGNOSIS — Z604 Social exclusion and rejection: Secondary | ICD-10-CM | POA: Diagnosis not present

## 2023-04-16 DIAGNOSIS — Z7984 Long term (current) use of oral hypoglycemic drugs: Secondary | ICD-10-CM | POA: Diagnosis not present

## 2023-04-16 DIAGNOSIS — Z7982 Long term (current) use of aspirin: Secondary | ICD-10-CM | POA: Diagnosis not present

## 2023-04-16 DIAGNOSIS — F17210 Nicotine dependence, cigarettes, uncomplicated: Secondary | ICD-10-CM | POA: Diagnosis not present

## 2023-04-16 DIAGNOSIS — I2510 Atherosclerotic heart disease of native coronary artery without angina pectoris: Secondary | ICD-10-CM | POA: Diagnosis not present

## 2023-04-16 DIAGNOSIS — Z556 Problems related to health literacy: Secondary | ICD-10-CM | POA: Diagnosis not present

## 2023-04-16 DIAGNOSIS — Z7902 Long term (current) use of antithrombotics/antiplatelets: Secondary | ICD-10-CM | POA: Diagnosis not present

## 2023-04-17 ENCOUNTER — Emergency Department (HOSPITAL_COMMUNITY)
Admission: EM | Admit: 2023-04-17 | Discharge: 2023-04-18 | Disposition: A | Discharge status: Home / Self Care | Attending: Emergency Medicine | Admitting: Emergency Medicine

## 2023-04-17 ENCOUNTER — Other Ambulatory Visit: Payer: Self-pay

## 2023-04-17 ENCOUNTER — Encounter (HOSPITAL_COMMUNITY): Payer: Self-pay | Admitting: Emergency Medicine

## 2023-04-17 DIAGNOSIS — E119 Type 2 diabetes mellitus without complications: Secondary | ICD-10-CM | POA: Insufficient documentation

## 2023-04-17 DIAGNOSIS — I2510 Atherosclerotic heart disease of native coronary artery without angina pectoris: Secondary | ICD-10-CM | POA: Diagnosis not present

## 2023-04-17 DIAGNOSIS — R410 Disorientation, unspecified: Secondary | ICD-10-CM | POA: Diagnosis not present

## 2023-04-17 DIAGNOSIS — I10 Essential (primary) hypertension: Secondary | ICD-10-CM | POA: Diagnosis not present

## 2023-04-17 DIAGNOSIS — Z556 Problems related to health literacy: Secondary | ICD-10-CM | POA: Diagnosis not present

## 2023-04-17 DIAGNOSIS — Z8551 Personal history of malignant neoplasm of bladder: Secondary | ICD-10-CM | POA: Insufficient documentation

## 2023-04-17 DIAGNOSIS — Z604 Social exclusion and rejection: Secondary | ICD-10-CM | POA: Diagnosis not present

## 2023-04-17 DIAGNOSIS — Z951 Presence of aortocoronary bypass graft: Secondary | ICD-10-CM | POA: Diagnosis not present

## 2023-04-17 DIAGNOSIS — I639 Cerebral infarction, unspecified: Secondary | ICD-10-CM | POA: Insufficient documentation

## 2023-04-17 DIAGNOSIS — Z79899 Other long term (current) drug therapy: Secondary | ICD-10-CM | POA: Insufficient documentation

## 2023-04-17 DIAGNOSIS — Z7984 Long term (current) use of oral hypoglycemic drugs: Secondary | ICD-10-CM | POA: Insufficient documentation

## 2023-04-17 DIAGNOSIS — R4182 Altered mental status, unspecified: Secondary | ICD-10-CM | POA: Insufficient documentation

## 2023-04-17 DIAGNOSIS — Z7982 Long term (current) use of aspirin: Secondary | ICD-10-CM | POA: Insufficient documentation

## 2023-04-17 DIAGNOSIS — Z7902 Long term (current) use of antithrombotics/antiplatelets: Secondary | ICD-10-CM | POA: Diagnosis not present

## 2023-04-17 DIAGNOSIS — F17210 Nicotine dependence, cigarettes, uncomplicated: Secondary | ICD-10-CM | POA: Diagnosis not present

## 2023-04-17 LAB — URINALYSIS, ROUTINE W REFLEX MICROSCOPIC
Bilirubin Urine: NEGATIVE
Glucose, UA: NEGATIVE mg/dL
Hgb urine dipstick: NEGATIVE
Ketones, ur: NEGATIVE mg/dL
Leukocytes,Ua: NEGATIVE
Nitrite: NEGATIVE
Protein, ur: NEGATIVE mg/dL
Specific Gravity, Urine: 1.011 (ref 1.005–1.030)
pH: 5 (ref 5.0–8.0)

## 2023-04-17 LAB — BASIC METABOLIC PANEL
Anion gap: 10 (ref 5–15)
BUN: 15 mg/dL (ref 8–23)
CO2: 23 mmol/L (ref 22–32)
Calcium: 9 mg/dL (ref 8.9–10.3)
Chloride: 103 mmol/L (ref 98–111)
Creatinine, Ser: 0.88 mg/dL (ref 0.61–1.24)
GFR, Estimated: >60 mL/min (ref >60)
Glucose, Bld: 159 mg/dL — ABNORMAL HIGH (ref 70–99)
Potassium: 3.7 mmol/L (ref 3.5–5.1)
Sodium: 136 mmol/L (ref 135–145)

## 2023-04-17 LAB — TROPONIN I (HIGH SENSITIVITY): Troponin I (High Sensitivity): 22 ng/L — ABNORMAL HIGH (ref <18)

## 2023-04-17 LAB — CBC
HCT: 44.3 % (ref 39.0–52.0)
Hemoglobin: 14.6 g/dL (ref 13.0–17.0)
MCH: 32.6 pg (ref 26.0–34.0)
MCHC: 33 g/dL (ref 30.0–36.0)
MCV: 98.9 fL (ref 80.0–100.0)
Platelets: 181 10*3/uL (ref 150–400)
RBC: 4.48 MIL/uL (ref 4.22–5.81)
RDW: 12.5 % (ref 11.5–15.5)
WBC: 6.5 10*3/uL (ref 4.0–10.5)
nRBC: 0 % (ref 0.0–0.2)

## 2023-04-17 NOTE — ED Provider Triage Note (Signed)
 Emergency Medicine Provider Triage Evaluation Note  Kenneth Kelly , a 73 y.o. male  was evaluated in triage.  Pt complains of elevated BP.  Patient had a stroke 2 weeks ago and was seen at a hospital in [place].  Was discharged home with metoprolol tartrate 25 mg to take for blood pressure since it has been elevated in the 200s over 100s range.  Discharge paperwork says take metoprolol succinate 50 mg.  There is confusion on what patient should be taking.  He denies any chest pain or worsening shortness of breath.  Family member at bedside states the patient has also persistent confusion since he had the stroke.  No new or worsening symptoms.  Review of Systems  Positive: Elevated blood pressure Negative: Chest pain  Physical Exam  BP (!) 214/96 (BP Location: Left Arm)   Pulse 76   Temp 98.1 F (36.7 C)   Resp 20   SpO2 96%  Gen:   Awake, no distress   Resp:  Normal effort  MSK:   Moves extremities without difficulty  Other:    Medical Decision Making  Medically screening exam initiated at 5:52 PM.  Appropriate orders placed.  Kenneth Kelly was informed that the remainder of the evaluation will be completed by another provider, this initial triage assessment does not replace that evaluation, and the importance of remaining in the ED until their evaluation is complete.   Maxwell Marion, PA-C 04/17/23 [phone]

## 2023-04-17 NOTE — ED Triage Notes (Signed)
 Pt reports ongoing HTN since stroke x 2 weeks ago despite medication regimen. Sister-in-law states she has been recording/managing his BP meds at home. On lisinopril and metoprolol. Per sister-in-law, there was some confusion regarding the dosage amount and frequency.

## 2023-04-18 MED ORDER — HYDRALAZINE HCL 20 MG/ML IJ SOLN
10.0000 mg | Freq: Once | INTRAMUSCULAR | Status: AC
  Administered 2023-04-18: 10 mg via INTRAVENOUS
  Filled 2023-04-18: qty 1

## 2023-04-18 MED ORDER — METOPROLOL SUCCINATE ER 50 MG PO TB24: 50.0000 mg | ORAL_TABLET | Freq: Every day | ORAL | 2 refills | Status: AC

## 2023-04-18 NOTE — Discharge Instructions (Signed)
 You were seen today with concerns for hypertension.  You were given a prescription for metoprolol 50 mg.  Take this.  Discontinue your metoprolol at home.  Follow-up with neurology.  A referral was placed.

## 2023-04-18 NOTE — ED Provider Notes (Signed)
 [facility] EMERGENCY DEPARTMENT AT [facility] Provider Note   CSN: [id number] Arrival date & time: 04/17/23  1721     History  Chief Complaint  Patient presents with   Hypertension    Kenneth Kelly is a 73 y.o. male.  HPI     This is a 73 year old male who presents with concerns for high blood pressure.  He presents with his brother and sister-in-law.  Recently admitted to an outside hospital after having an episode of altered mental status.  They reported confusion.  He was found to have a subacute stroke but based on chart review it is unclear whether this was the cause of his altered mental status.  Notably hypertensive.  He was allowed to be permissively hypertensive while in the hospital.  He was discharged to change his metoprolol to 50 mg metoprolol XL.  However, sister reports that he still has metoprolol 25 mg that is immediate release.  He is only been taking this once a day.  They did noted that his blood pressures have been persistently elevated.  He has not really had any change in his mental status since his hospitalization.  He has not complained of anything.  However they have had difficulty getting in with neurology.  They called her primary doctor's office and were referred to the emergency room given persistently elevated blood pressures at home.  Home Medications Prior to Admission medications   Medication Sig Start Date End Date Taking? Authorizing Provider  metoprolol succinate (TOPROL-XL) 50 MG 24 hr tablet Take 1 tablet (50 mg total) by mouth daily. 04/18/23  Yes Willean Schurman, Mayer Masker, MD  amLODipine (NORVASC) 10 MG tablet Take 10 mg by mouth daily. 01/03/22   [provider]  aspirin 81 MG EC tablet Take 1 tablet (81 mg total) by mouth daily. Swallow whole. 07/25/20   Marinda Elk, MD  atorvastatin (LIPITOR) 80 MG tablet Take 1 tablet (80 mg total) by mouth daily. 11/27/22   Sharlene Dory, NP  clopidogrel (PLAVIX) 75 MG tablet Take 75  mg by mouth daily. 08/13/21   [provider]  colchicine 0.6 MG tablet Take 0.6 mg by mouth as needed. 07/08/22   [provider]  glipiZIDE (GLUCOTROL XL) 5 MG 24 hr tablet Take 1 tablet (5 mg total) by mouth daily with breakfast. 07/24/22   Nida, Denman George, MD  ibuprofen (ADVIL) 200 MG tablet Take 400 mg by mouth every 6 (six) hours as needed for mild pain or moderate pain.    [provider]  indomethacin (INDOCIN) 50 MG capsule Take 50 mg by mouth 3 (three) times daily as needed. 07/08/22   [provider]  lipase/protease/amylase (CREON) 36000 UNITS CPEP capsule Take 1 capsule (36,000 Units total) by mouth 3 (three) times daily with meals AND 1 capsule (36,000 Units total) 3 (three) times daily before meals. 08/22/22   Roma Kayser, MD  lisinopril (PRINIVIL,ZESTRIL) 10 MG tablet Take 10 mg by mouth every morning.    [provider]  metFORMIN (GLUCOPHAGE) 500 MG tablet Take 1 tablet (500 mg total) by mouth 2 (two) times daily with a meal. 08/22/22   Nida, Denman George, MD  nitroGLYCERIN (NITROSTAT) 0.4 MG SL tablet Place 1 tablet (0.4 mg total) under the tongue every 5 (five) minutes as needed for chest pain. 02/27/16   Laqueta Linden, MD  pantoprazole (PROTONIX) 40 MG tablet Take 40 mg by mouth daily. 06/11/22   [provider]  simethicone Elray Mcgregor)  125 MG chewable tablet Chew 125 mg by mouth every 6 (six) hours as needed for flatulence.    [provider]      Allergies    Patient has no known allergies.    Review of Systems   Review of Systems  Constitutional:  Negative for fever.  Respiratory:  Negative for shortness of breath.   Cardiovascular:  Negative for chest pain.  Psychiatric/Behavioral:  Positive for confusion.   All other systems reviewed and are negative.   Physical Exam Updated Vital Signs BP (!) 147/75   Pulse (!) 57   Temp 98 F (36.7 C) (Oral)   Resp 18   SpO2 95%  Physical  Exam Vitals and nursing note reviewed.  Constitutional:      Appearance: He is well-developed. He is not ill-appearing.  HENT:     Head: Normocephalic and atraumatic.     Mouth/Throat:     Mouth: Mucous membranes are dry.  Eyes:     Pupils: Pupils are equal, round, and reactive to light.  Cardiovascular:     Rate and Rhythm: Normal rate and regular rhythm.     Heart sounds: Normal heart sounds. No murmur heard. Pulmonary:     Effort: Pulmonary effort is normal. No respiratory distress.     Breath sounds: Normal breath sounds. No wheezing.  Abdominal:     Palpations: Abdomen is soft.     Tenderness: There is no abdominal tenderness. There is no rebound.  Musculoskeletal:     Cervical back: Neck supple.  Lymphadenopathy:     Cervical: No cervical adenopathy.  Skin:    General: Skin is warm and dry.  Neurological:     Mental Status: He is alert and oriented to person, place, and time.     Comments: Technically oriented x 3 but provides minimal history, 5 out of 5 strength in all 4 extremities, no dysmetria to finger-nose-finger  Psychiatric:        Mood and Affect: Mood normal.     ED Results / Procedures / Treatments   Labs (all labs ordered are listed, but only abnormal results are displayed) Labs Reviewed  BASIC METABOLIC PANEL - Abnormal; Notable for the following components:      Result Value   Glucose, Bld 159 (*)    All other components within normal limits  TROPONIN I (HIGH SENSITIVITY) - Abnormal; Notable for the following components:   Troponin I (High Sensitivity) 22 (*)    All other components within normal limits  CBC  URINALYSIS, ROUTINE W REFLEX MICROSCOPIC  CBG MONITORING, ED  TROPONIN I (HIGH SENSITIVITY)    EKG EKG Interpretation Date/Time:  Thursday April 17 2023 18:08:36 EDT Ventricular Rate:  68 PR Interval:  202 QRS Duration:  90 QT Interval:  404 QTC Calculation: 429 R Axis:   64  Text Interpretation: Normal sinus rhythm Nonspecific ST  abnormality Abnormal ECG When compared with ECG of 27-Nov-2022 11:00, PREVIOUS ECG IS PRESENT Confirmed by Ross Marcus ([id number]) on 04/17/2023 11:39:22 PM  Radiology No results found.  Procedures .Critical Care  Performed by: Shon Baton, MD Authorized by: Shon Baton, MD   Critical care provider statement:    Critical care time (minutes):  31   Critical care was necessary to treat or prevent imminent or life-threatening deterioration of the following conditions: Hypertension.   Critical care was time spent personally by me on the following activities:  Development of treatment plan with patient or surrogate, discussions with consultants, evaluation  of patient's response to treatment, examination of patient, ordering and review of laboratory studies, ordering and review of radiographic studies, ordering and performing treatments and interventions, pulse oximetry, re-evaluation of patient's condition and review of old charts     Medications Ordered in ED Medications  hydrALAZINE (APRESOLINE) injection 10 mg (10 mg Intravenous Given 04/18/23 0028)    ED Course/ Medical Decision Making/ A&P                                 Medical Decision Making Amount and/or Complexity of Data Reviewed Labs: ordered.  Risk Prescription drug management.   This patient presents to the ED for concern of hypertension, this involves an extensive number of treatment options, and is a complaint that carries with it a high risk of complications and morbidity.  I considered the following differential and admission for this acute, potentially life threatening condition.  The differential diagnosis includes hypertension, hypertensive urgency, hypertensive emergency  MDM:    This is a 73 year old male with recent stroke and admission at outside hospital who presents with concerns for high blood pressure.  Has not been able to follow-up as an outpatient with neurology.  Has had persistently  elevated blood pressures.  Was discharged with instructions to start metoprolol 50 mg XL; however continues metoprolol 25 mg once daily.  He is nontoxic.  He is a little slow to speak but not dysarthric or aphasic.  Family reports that he is at his baseline otherwise.  Labs obtained and largely reassuring.  Initial blood pressure 214/96.  He was given 1 dose of hydralazine with repeat blood pressures in the 140s to 150s.  Do not want to drop him too low.  Will provide a referral to the family for neurology as an outpatient given recent hospitalization.  Recommend that he transition to metoprolol 50 mg XL as previously instructed.  I sent a prescription to his pharmacy.  No emergent signs or symptoms of hypertensive urgency or emergency.  (Labs, imaging, consults)  Labs: I Ordered, and personally interpreted labs.  The pertinent results include: CBC, BMP, troponin, urinalysis  Imaging Studies ordered: I ordered imaging studies including none I independently visualized and interpreted imaging. I agree with the radiologist interpretation  Additional history obtained from chart review.  External records from outside source obtained and reviewed including recent hospitalizations  Cardiac Monitoring: The patient was maintained on a cardiac monitor.  If on the cardiac monitor, I personally viewed and interpreted the cardiac monitored which showed an underlying rhythm of: Sinus  Reevaluation: After the interventions noted above, I reevaluated the patient and found that they have :improved  Social Determinants of Health:  lives with family  Disposition: Discharge  Co morbidities that complicate the patient evaluation  Past Medical History:  Diagnosis Date   Abdominal aortic aneurysm (AAA) (HCC) followed by cardiologist-- dr Purvis Sheffield   per last CT 01-12-2016  Infrarenal aortic aneurysm 3.4cm   Arthritis    Hip   Bladder tumor    CAD (coronary artery disease)    CARDIOLOGIST-  DR  Purvis Sheffield---  HX CABG X3 08-15-1999   First degree AV block    Gout    History of bladder cancer urologist-  dr Annabell Howells   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   History of fracture of pelvis    Hyperlipidemia 02/21/2009   Qualifier: Diagnosis of  By: Via LPN, Larita Fife     Hypertension  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    Dispense:  30 tablet    Refill:  2    I have reviewed the patients home medicines and have made adjustments as needed  Problem List / ED Course: Problem List Items Addressed This Visit       Cardiovascular and Mediastinum   Acute CVA (cerebrovascular accident) (HCC)   Relevant Medications   metoprolol succinate (TOPROL-XL) 50 MG 24 hr tablet   Other Relevant Orders   Ambulatory referral to Neurology   Other Visit Diagnoses       Hypertension, unspecified type    -  Primary   Relevant Medications   hydrALAZINE (APRESOLINE) injection 10 mg (Completed)   metoprolol succinate (TOPROL-XL) 50 MG 24 hr tablet   Other Relevant Orders   Ambulatory referral to Neurology                   Final Clinical Impression(s) / ED Diagnoses Final diagnoses:  Hypertension, unspecified type  Acute CVA (cerebrovascular accident) (HCC)    Rx / DC Orders ED Discharge Orders          Ordered    metoprolol succinate (TOPROL-XL) 50 MG 24 hr tablet  Daily        04/18/23 0252    Ambulatory referral to Neurology       Comments: An appointment is requested in approximately: 1 week   04/18/23 0252              Shon Baton, MD 04/18/23 [phone]

## 2023-04-21 DIAGNOSIS — Z7982 Long term (current) use of aspirin: Secondary | ICD-10-CM | POA: Diagnosis not present

## 2023-04-21 DIAGNOSIS — I10 Essential (primary) hypertension: Secondary | ICD-10-CM | POA: Diagnosis not present

## 2023-04-21 DIAGNOSIS — F17210 Nicotine dependence, cigarettes, uncomplicated: Secondary | ICD-10-CM | POA: Diagnosis not present

## 2023-04-21 DIAGNOSIS — Z7984 Long term (current) use of oral hypoglycemic drugs: Secondary | ICD-10-CM | POA: Diagnosis not present

## 2023-04-21 DIAGNOSIS — Z556 Problems related to health literacy: Secondary | ICD-10-CM | POA: Diagnosis not present

## 2023-04-21 DIAGNOSIS — I2510 Atherosclerotic heart disease of native coronary artery without angina pectoris: Secondary | ICD-10-CM | POA: Diagnosis not present

## 2023-04-21 DIAGNOSIS — E119 Type 2 diabetes mellitus without complications: Secondary | ICD-10-CM | POA: Diagnosis not present

## 2023-04-21 DIAGNOSIS — Z604 Social exclusion and rejection: Secondary | ICD-10-CM | POA: Diagnosis not present

## 2023-04-21 DIAGNOSIS — Z7902 Long term (current) use of antithrombotics/antiplatelets: Secondary | ICD-10-CM | POA: Diagnosis not present

## 2023-04-21 LAB — CBG MONITORING, ED: Glucose-Capillary: 194 mg/dL — ABNORMAL HIGH (ref 70–99)

## 2023-04-23 DIAGNOSIS — F17210 Nicotine dependence, cigarettes, uncomplicated: Secondary | ICD-10-CM | POA: Diagnosis not present

## 2023-04-23 DIAGNOSIS — E119 Type 2 diabetes mellitus without complications: Secondary | ICD-10-CM | POA: Diagnosis not present

## 2023-04-23 DIAGNOSIS — Z7902 Long term (current) use of antithrombotics/antiplatelets: Secondary | ICD-10-CM | POA: Diagnosis not present

## 2023-04-23 DIAGNOSIS — Z604 Social exclusion and rejection: Secondary | ICD-10-CM | POA: Diagnosis not present

## 2023-04-23 DIAGNOSIS — Z7984 Long term (current) use of oral hypoglycemic drugs: Secondary | ICD-10-CM | POA: Diagnosis not present

## 2023-04-23 DIAGNOSIS — I10 Essential (primary) hypertension: Secondary | ICD-10-CM | POA: Diagnosis not present

## 2023-04-23 DIAGNOSIS — I2510 Atherosclerotic heart disease of native coronary artery without angina pectoris: Secondary | ICD-10-CM | POA: Diagnosis not present

## 2023-04-23 DIAGNOSIS — Z556 Problems related to health literacy: Secondary | ICD-10-CM | POA: Diagnosis not present

## 2023-04-23 DIAGNOSIS — Z7982 Long term (current) use of aspirin: Secondary | ICD-10-CM | POA: Diagnosis not present

## 2023-04-23 NOTE — Progress Notes (Signed)
 [facility] Summary Report / Loop Recorder

## 2023-04-28 NOTE — Progress Notes (Unsigned)
 NEUROLOGY CONSULTATION NOTE  Kenneth Kelly MRN: [id number] DOB: 1951-01-30  Referring provider: Tanja Port, PA-C Primary care provider: Assunta Found, MD  Reason for consult:  altered mental status, stroke  Assessment/Plan:   Left occipital infarct, cryptogenic Major neurocognitive disorder, vascular Uncontrolled hypertension Type 2 diabetes mellitus Hyperlipidemia Carotid artery disease Coronary artery disease   Secondary stroke prevention as managed by PCP: ASA 81mg  and Plavix 75mg  daily with plan to eventually discontinue ASA (duration depending on CTA results) Statin.  LDL goal less than 70 Normotensive blood pressure - follow up with PCP immedidately Hgb A1c goal less than 7 Check CTA head and neck Continue PT/OT/speech therapy Follow up 5 months.   Subjective:  Kenneth Kelly is a 73 year old right-handed male with CAD s/p CABG x3, HTN, DM 2, HLD, first degree AV block and history of bladder cancer who presents for altered mental status and stroke.  History supplemented by his accompanying sister-in-law, hospital records and referring provider's note.  Imaging from June 2022 personally reviewed.   In June 2022, patient had a large acute right PICA territory infarct with right PICA occlusion presenting as generalized weakness, nausea, vomiting and headache.  Workup also revealed bilateral ICA stenosis (>70% on right and 50-69% on left).  Echo showed LVEF 60-65% with asymmetric LV hypertrophy.  Given evidence of subacute infarct in left frontal cortex and multiple additional chronic infarcts, there was still a concern for underlying paroxysmal atrial fibrillation and he had a loop recorder implanted.  He subsequently underwent right CEA in October 2023 for the asymptomatic right carotid stenosis.  After the stroke, he had some residual symptoms affecting gait.   On 04/02/2023, he didn't feel well and seemed disoriented.  He presented to the ED at [facility] where brain  imaging revealed a subacute left occipital lobe infarct.  SBP was 202.  Echo showed EF >55% with small grade 1 intrapulmonary shunt.  LDL was 104 and Hgb A1c was 7.  He was continued on both ASA 81mg  and Plavix 75mg  daily.  Simvastatin was changed to atorvastatin 40mg  daily.  It was determined that he had been noncompliant with his medications.  Metoprolol has been increased as his BP still running high.  Often SBP is above 200 and DBP may be 100.  Since then, he remains confused.  He lives with his brother and sister-in-law.  He is now disoriented at home.  He cannot remember how to get to his room.  He will go to the bathroom on his bedroom floor.  His vision remains impaired since the stroke.  He just sits in the living room watching TV.  Doesn't interact much with others.  He does not know his social security number or can manage his finances.  He needs prompting and assistance with ADLs such as dressing, bathing, using toilet.   His family manages his medications.   His sister-in-law states that she didn't notice any significant memory problems prior to this recent stroke but that his granddaughter thought beforehand that he may have already had some dementia.   Imaging: 07/21/2020 CT HEAD WO:  1. Suspected acute infarct involving a portion of the right posteroinferior cerebellar artery distribution. There is edema with sulcal effacement in this area extending medially to the inferior aspect of the fourth ventricle on the right. An underlying mass in this area is a less likely differential consideration. Correlation with MR pre and post-contrast to further evaluate may well be warranted. 2. Prior infarct in  the superior posterior left parietal lobe. Prior infarct in rostrum of corpus callosum on the right. White matter infarcts noted in the superior left centrum semiovale which appear chronic. There is periventricular small vessel disease. 3.  No hemorrhage or extra-axial fluid. 4.  Multiple foci of  arterial vascular calcification noted.  Addendum:  Suspected posterior circulation infarct may extend slightly anterior to the fourth ventricle as well. 07/21/2020 MRI BRAIN W WO:  Large acute infarct of the right PICA territory. Punctate acute right occipital infarct.  Small subacute to chronic right frontal cortical infarct. Chronic infarcts of left frontoparietal lobes and right ventral corpus callosum. Mild chronic microvascular ischemic changes. Tiny left temporal convexity meningioma. 07/21/2020 CTA HEAD &  NECK:  Occlusion of the right PICA approximately 11 mm beyond the origin.  Patent vertebral arteries. No stenosis in the neck. Calcified plaque intracranially on the left greater than right.  Plaque along the common carotids causes less than 50% stenosis. Plaque at the right ICA origin causes 70% stenosis. Plaque at the left ICA origin causes 60% stenosis. Plaque along the intracranial internal carotids causes moderate to marked stenosis. 04/02/2023 CT HEAD WO:  Subacute infarct involving the left occipital lobe and extending into the temporal lobe.  Chronic white matter ischemic changes.   04/03/2023 MRI BRAIN WO:  Acute left PCA territory infarct involving the left occipital lobe  and medial aspect of the left posterior temporal lobe. No hemorrhage. Chronic infarcts in the right cerebellum, left parietal lobe, and right frontal lobe.  There is background of moderate chronic microvascular ischemic changes.    PAST MEDICAL HISTORY: Past Medical History:  Diagnosis Date   Abdominal aortic aneurysm (AAA) (HCC) followed by cardiologist-- dr Purvis Sheffield   per last CT 01-12-2016  Infrarenal aortic aneurysm 3.4cm   Arthritis    Hip   Bladder tumor    CAD (coronary artery disease)    CARDIOLOGIST-  DR Purvis Sheffield---  HX CABG X3 08-15-1999   First degree AV block    Gout    History of bladder cancer urologist-  dr Annabell Howells   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   History of fracture  of pelvis    Hyperlipidemia 02/21/2009   Qualifier: Diagnosis of  By: Via LPN, Larita Fife     Hypertension    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   LIMA to LCFx, SVG to RCA, RIMA to LAD   Sinus bradycardia on ECG    Stroke (HCC) 2022   Type 2 diabetes mellitus (HCC)     PAST SURGICAL HISTORY: Past Surgical History:  Procedure Laterality Date   CARDIAC CATHETERIZATION  07-20-1999  dr wall   significant 2v CAD, total occlusion LAD an 80% LCFx neither amenable to PCI   CARDIOVASCULAR STRESS TEST  04-23-2016  dr Purvis Sheffield ([place])   Low risk nuclear study w/ small, mild intensity, reversible mid to apical inferior defect that is consistent to ischemia but no diagnotic ST segment changes to indicate ischemia/  normal LV function and wall motion ,  nuclear stress ef 57%   CORONARY ARTERY BYPASS GRAFT  08/15/1999   dr gerhardt [facility]   x3, LIMA to left circumflex, SVG to RCA, and right internal mammary artery graft to the LAD   CYSTOSCOPY WITH BIOPSY N/A 04/08/2017   Procedure: CYSTOSCOPY;  Surgeon: Bjorn Pippin, MD;  Location: [facility];  Service: Urology;  Laterality: N/A;   CYSTOSCOPY WITH STENT PLACEMENT Right 01/23/2016   Procedure: CYSTOSCOPY WITH STENT PLACEMENT;  Surgeon: Bjorn Pippin, MD;  Location: [facility];  Service: Urology;  Laterality: Right;   ENDARTERECTOMY Right 11/13/2021   Procedure: ENDARTERECTOMY CAROTID;  Surgeon: Maeola Harman, MD;  Location: [facility] OR;  Service: Vascular;  Laterality: Right;   KNEE ARTHROSCOPY Right 2001  approx.   LOOP RECORDER INSERTION N/A 07/24/2020   Procedure: LOOP RECORDER INSERTION;  Surgeon: Marinus Maw, MD;  Location: [facility] INVASIVE CV LAB;  Service: Cardiovascular;  Laterality: N/A;   PATCH ANGIOPLASTY Right 11/13/2021   Procedure: PATCH ANGIOPLASTY USING 1CM X 6CM BOVINE PATCH;  Surgeon: Maeola Harman, MD;  Location: [facility] OR;  Service: Vascular;  Laterality: Right;   POSTERIOR LAMINECTOMY / DECOMPRESSION LUMBAR SPINE   09-01-2009   [facility]   TRANSURETHRAL RESECTION OF BLADDER TUMOR WITH MITOMYCIN-C Bilateral 01/23/2016   Procedure: CYSTOSCOPY ,TRANSURETHRAL RESECTION OF BLADDER TUMOR;  Surgeon: Bjorn Pippin, MD;  Location: [facility];  Service: Urology;  Laterality: Bilateral;    MEDICATIONS: Current Outpatient Medications on File Prior to Visit  Medication Sig Dispense Refill   amLODipine (NORVASC) 10 MG tablet Take 10 mg by mouth daily.     aspirin 81 MG EC tablet Take 1 tablet (81 mg total) by mouth daily. Swallow whole. 30 tablet 11   atorvastatin (LIPITOR) 80 MG tablet Take 1 tablet (80 mg total) by mouth daily. 90 tablet 1   clopidogrel (PLAVIX) 75 MG tablet Take 75 mg by mouth daily.     colchicine 0.6 MG tablet Take 0.6 mg by mouth as needed.     glipiZIDE (GLUCOTROL XL) 5 MG 24 hr tablet Take 1 tablet (5 mg total) by mouth daily with breakfast. 30 tablet 3   ibuprofen (ADVIL) 200 MG tablet Take 400 mg by mouth every 6 (six) hours as needed for mild pain or moderate pain.     indomethacin (INDOCIN) 50 MG capsule Take 50 mg by mouth 3 (three) times daily as needed.     lipase/protease/amylase (CREON) 36000 UNITS CPEP capsule Take 1 capsule (36,000 Units total) by mouth 3 (three) times daily with meals AND 1 capsule (36,000 Units total) 3 (three) times daily before meals. 270 capsule 2   lisinopril (PRINIVIL,ZESTRIL) 10 MG tablet Take 10 mg by mouth every morning.     metFORMIN (GLUCOPHAGE) 500 MG tablet Take 1 tablet (500 mg total) by mouth 2 (two) times daily with a meal. 180 tablet 1   metoprolol succinate (TOPROL-XL) 50 MG 24 hr tablet Take 1 tablet (50 mg total) by mouth daily. 30 tablet 2   nitroGLYCERIN (NITROSTAT) 0.4 MG SL tablet Place 1 tablet (0.4 mg total) under the tongue every 5 (five) minutes as needed for chest pain. 25 tablet 3   pantoprazole (PROTONIX) 40 MG tablet Take 40 mg by mouth daily.     simethicone (MYLICON) 125 MG chewable tablet Chew 125 mg by mouth every 6 (six) hours as needed for  flatulence.     No current facility-administered medications on file prior to visit.    ALLERGIES: No Known Allergies  FAMILY HISTORY: Family History  Problem Relation Age of Onset   Diabetes Mother    Hypertension Father    Heart disease Father    Stroke Father    Heart attack Brother    Heart disease Brother    Heart disease Brother     Objective:  Blood pressure (!) 174/87, pulse 74, resp. rate 18, weight 117 lb (53.1 kg), SpO2 98%. General: No acute distress.  Patient appears well-groomed.  Head:  Normocephalic/atraumatic Eyes:  fundi examined but not visualized Neck: supple, no paraspinal tenderness, full range of motion Heart: regular rate and rhythm Vascular: No carotid bruits. Neurological Exam: Mental status: alert and oriented to person only, speech fluent and not dysarthric, some difficulty with naming and following some commands.    04/29/2023    1:00 PM  Montreal Cognitive Assessment   Visuospatial/ Executive (0/5) 0  Naming (0/3) 0  Attention: Read list of digits (0/2) 1  Attention: Read list of letters (0/1) 0  Attention: Serial 7 subtraction starting at 100 (0/3) 0  Language: Repeat phrase (0/2) 1  Language : Fluency (0/1) 0  Abstraction (0/2) 1  Delayed Recall (0/5) 0  Orientation (0/6) 0  Total 3  Adjusted Score (based on education) 4   Cranial nerves: CN I: not tested CN II: pupils equal, round and reactive to light, right homonymous hemianopsia CN III, IV, VI:  full range of motion, no nystagmus, no ptosis CN V: facial sensation intact. CN VII: upper and lower face symmetric CN VIII: hearing intact CN IX, X: gag intact, uvula midline CN XI: sternocleidomastoid and trapezius muscles intact CN XII: tongue midline Bulk & Tone: normal, no fasciculations. Motor:  muscle strength 5/5 throughout Sensation:  Pinprick sensation reduced in right upper extremity; vibratory sensation intact. Deep Tendon Reflexes:  2+ throughout,  toes downgoing.    Finger to nose testing:  Without dysmetria.   Heel to shin:  Without dysmetria.   Gait:  Cautious wide-based gait with reduced right arm swing.  Romberg negative.    Thank you for allowing me to take part in the care of this patient.  Shon Millet, DO  CC:  Assunta Found, MD

## 2023-04-29 ENCOUNTER — Encounter: Payer: Self-pay | Admitting: Neurology

## 2023-04-29 ENCOUNTER — Ambulatory Visit: Admitting: Neurology

## 2023-04-29 VITALS — BP 174/87 | HR 74 | Resp 18 | Wt 117.0 lb

## 2023-04-29 DIAGNOSIS — Z6821 Body mass index (BMI) 21.0-21.9, adult: Secondary | ICD-10-CM | POA: Diagnosis not present

## 2023-04-29 DIAGNOSIS — F01C0 Vascular dementia, severe, without behavioral disturbance, psychotic disturbance, mood disturbance, and anxiety: Secondary | ICD-10-CM | POA: Diagnosis not present

## 2023-04-29 DIAGNOSIS — I10 Essential (primary) hypertension: Secondary | ICD-10-CM | POA: Diagnosis not present

## 2023-04-29 DIAGNOSIS — I639 Cerebral infarction, unspecified: Secondary | ICD-10-CM

## 2023-04-29 DIAGNOSIS — E785 Hyperlipidemia, unspecified: Secondary | ICD-10-CM | POA: Diagnosis not present

## 2023-04-29 NOTE — Patient Instructions (Addendum)
 Continue aspirin and clopidogrel daily for now Check CTA head and neck at [facility] [phone] Continue atorvastatin Follow up with PCP regarding blood pressure control Continue speech therapy, physical therapy Needs supervision Follow up 5 months.

## 2023-04-30 ENCOUNTER — Encounter: Payer: Self-pay | Admitting: Neurology

## 2023-04-30 DIAGNOSIS — I10 Essential (primary) hypertension: Secondary | ICD-10-CM | POA: Diagnosis not present

## 2023-04-30 DIAGNOSIS — Z604 Social exclusion and rejection: Secondary | ICD-10-CM | POA: Diagnosis not present

## 2023-04-30 DIAGNOSIS — Z556 Problems related to health literacy: Secondary | ICD-10-CM | POA: Diagnosis not present

## 2023-04-30 DIAGNOSIS — E119 Type 2 diabetes mellitus without complications: Secondary | ICD-10-CM | POA: Diagnosis not present

## 2023-04-30 DIAGNOSIS — Z7902 Long term (current) use of antithrombotics/antiplatelets: Secondary | ICD-10-CM | POA: Diagnosis not present

## 2023-04-30 DIAGNOSIS — I2510 Atherosclerotic heart disease of native coronary artery without angina pectoris: Secondary | ICD-10-CM | POA: Diagnosis not present

## 2023-04-30 DIAGNOSIS — Z7982 Long term (current) use of aspirin: Secondary | ICD-10-CM | POA: Diagnosis not present

## 2023-04-30 DIAGNOSIS — F17210 Nicotine dependence, cigarettes, uncomplicated: Secondary | ICD-10-CM | POA: Diagnosis not present

## 2023-04-30 DIAGNOSIS — Z7984 Long term (current) use of oral hypoglycemic drugs: Secondary | ICD-10-CM | POA: Diagnosis not present

## 2023-05-01 ENCOUNTER — Encounter (HOSPITAL_COMMUNITY): Payer: Self-pay

## 2023-05-01 ENCOUNTER — Emergency Department (HOSPITAL_COMMUNITY)
Admission: EM | Admit: 2023-05-01 | Discharge: 2023-05-01 | Disposition: A | Discharge status: Home / Self Care | Attending: Emergency Medicine | Admitting: Emergency Medicine

## 2023-05-01 ENCOUNTER — Emergency Department (HOSPITAL_COMMUNITY)

## 2023-05-01 DIAGNOSIS — Z8673 Personal history of transient ischemic attack (TIA), and cerebral infarction without residual deficits: Secondary | ICD-10-CM | POA: Insufficient documentation

## 2023-05-01 DIAGNOSIS — R0989 Other specified symptoms and signs involving the circulatory and respiratory systems: Secondary | ICD-10-CM | POA: Diagnosis not present

## 2023-05-01 DIAGNOSIS — Z7901 Long term (current) use of anticoagulants: Secondary | ICD-10-CM | POA: Insufficient documentation

## 2023-05-01 DIAGNOSIS — Z7982 Long term (current) use of aspirin: Secondary | ICD-10-CM | POA: Diagnosis not present

## 2023-05-01 DIAGNOSIS — J439 Emphysema, unspecified: Secondary | ICD-10-CM | POA: Insufficient documentation

## 2023-05-01 DIAGNOSIS — E119 Type 2 diabetes mellitus without complications: Secondary | ICD-10-CM | POA: Diagnosis not present

## 2023-05-01 DIAGNOSIS — I700 Atherosclerosis of aorta: Secondary | ICD-10-CM | POA: Diagnosis not present

## 2023-05-01 DIAGNOSIS — I159 Secondary hypertension, unspecified: Secondary | ICD-10-CM

## 2023-05-01 DIAGNOSIS — I10 Essential (primary) hypertension: Secondary | ICD-10-CM | POA: Diagnosis present

## 2023-05-01 DIAGNOSIS — Z79899 Other long term (current) drug therapy: Secondary | ICD-10-CM | POA: Diagnosis not present

## 2023-05-01 DIAGNOSIS — Z7984 Long term (current) use of oral hypoglycemic drugs: Secondary | ICD-10-CM | POA: Insufficient documentation

## 2023-05-01 DIAGNOSIS — R9431 Abnormal electrocardiogram [ECG] [EKG]: Secondary | ICD-10-CM | POA: Insufficient documentation

## 2023-05-01 DIAGNOSIS — Z951 Presence of aortocoronary bypass graft: Secondary | ICD-10-CM | POA: Diagnosis not present

## 2023-05-01 DIAGNOSIS — I2510 Atherosclerotic heart disease of native coronary artery without angina pectoris: Secondary | ICD-10-CM | POA: Diagnosis not present

## 2023-05-01 DIAGNOSIS — E785 Hyperlipidemia, unspecified: Secondary | ICD-10-CM | POA: Diagnosis not present

## 2023-05-01 LAB — CBG MONITORING, ED: Glucose-Capillary: 205 mg/dL — ABNORMAL HIGH (ref 70–99)

## 2023-05-01 LAB — BASIC METABOLIC PANEL WITH GFR
Anion gap: 12 (ref 5–15)
BUN: 17 mg/dL (ref 8–23)
CO2: 27 mmol/L (ref 22–32)
Calcium: 9.1 mg/dL (ref 8.9–10.3)
Chloride: 104 mmol/L (ref 98–111)
Creatinine, Ser: 0.9 mg/dL (ref 0.61–1.24)
GFR, Estimated: >60 mL/min (ref >60)
Glucose, Bld: 220 mg/dL — ABNORMAL HIGH (ref 70–99)
Potassium: 3.8 mmol/L (ref 3.5–5.1)
Sodium: 143 mmol/L (ref 135–145)

## 2023-05-01 LAB — CBC WITH DIFFERENTIAL/PLATELET
Abs Immature Granulocytes: 0.01 10*3/uL (ref 0.00–0.07)
Basophils Absolute: 0.1 10*3/uL (ref 0.0–0.1)
Basophils Relative: 1 %
Eosinophils Absolute: 0.5 10*3/uL (ref 0.0–0.5)
Eosinophils Relative: 7 %
HCT: 43.8 % (ref 39.0–52.0)
Hemoglobin: 14.6 g/dL (ref 13.0–17.0)
Immature Granulocytes: 0 %
Lymphocytes Relative: 24 %
Lymphs Abs: 1.6 10*3/uL (ref 0.7–4.0)
MCH: 32.5 pg (ref 26.0–34.0)
MCHC: 33.3 g/dL (ref 30.0–36.0)
MCV: 97.6 fL (ref 80.0–100.0)
Monocytes Absolute: 0.5 10*3/uL (ref 0.1–1.0)
Monocytes Relative: 8 %
Neutro Abs: 3.8 10*3/uL (ref 1.7–7.7)
Neutrophils Relative %: 60 %
Platelets: 166 10*3/uL (ref 150–400)
RBC: 4.49 MIL/uL (ref 4.22–5.81)
RDW: 13.1 % (ref 11.5–15.5)
WBC: 6.4 10*3/uL (ref 4.0–10.5)
nRBC: 0 % (ref 0.0–0.2)

## 2023-05-01 LAB — BRAIN NATRIURETIC PEPTIDE: B Natriuretic Peptide: 739 pg/mL — ABNORMAL HIGH (ref 0.0–100.0)

## 2023-05-01 MED ORDER — AMLODIPINE BESYLATE 5 MG PO TABS
10.0000 mg | ORAL_TABLET | Freq: Once | ORAL | Status: AC
  Administered 2023-05-01: 10 mg via ORAL
  Filled 2023-05-01: qty 2

## 2023-05-01 MED ORDER — AMLODIPINE BESYLATE 5 MG PO TABS: 10.0000 mg | ORAL_TABLET | Freq: Once | ORAL | Status: DC

## 2023-05-01 MED ORDER — AMLODIPINE BESYLATE 10 MG PO TABS: 10.0000 mg | ORAL_TABLET | Freq: Every day | ORAL | 2 refills | Status: AC

## 2023-05-01 MED ORDER — HYDRALAZINE HCL 25 MG PO TABS
25.0000 mg | ORAL_TABLET | ORAL | Status: AC
  Administered 2023-05-01: 25 mg via ORAL
  Filled 2023-05-01: qty 1

## 2023-05-01 NOTE — ED Triage Notes (Signed)
 Pt arrived via POV c/o hypertension. Pts relative Kenneth Kelly reports calling Dr Lamar Blinks office earlier today and being advised to bring the Pt to the ER for evaluation. Per family member, Pt recently suffered from a CVA. Pt disoriented to place, time and situation. Pt only oriented to self at this time in Triage.

## 2023-05-01 NOTE — ED Provider Notes (Signed)
 [place] EMERGENCY DEPARTMENT AT [facility] Provider Note   CSN: [id number] Arrival date & time: 05/01/23  1615     History  Chief Complaint  Patient presents with   Hypertension    Kenneth Kelly is a 73 y.o. male.  73 year old male with history of CAD status post CABG, hypertension, hyperlipidemia, diabetes, and cryptogenic left occipital infarct who presents to the emergency department with elevated blood pressure.  History obtained per the patient's brother and sister-in-law.  They report that he is living with them and he manages medications.  Since being discharged home from his stroke a month ago has been taking metoprolol (currently 50 mg twice daily of the XL formulation) but has had persistently elevated blood pressures.  Typically in the 170s systolic.  Recently blood pressures started getting as high as the 200s.  No new symptoms.  Denies any headache, vision changes, chest pain, shortness of breath, or leg swelling.  Also is taking olmesartan 40 mg that was started on Tuesday of this week.       Home Medications Prior to Admission medications   Medication Sig Start Date End Date Taking? Authorizing Provider  amLODipine (NORVASC) 10 MG tablet Take 1 tablet (10 mg total) by mouth daily. 05/01/23   Rondel Baton, MD  aspirin 81 MG EC tablet Take 1 tablet (81 mg total) by mouth daily. Swallow whole. 07/25/20   Marinda Elk, MD  atorvastatin (LIPITOR) 80 MG tablet Take 1 tablet (80 mg total) by mouth daily. 11/27/22   Sharlene Dory, NP  clopidogrel (PLAVIX) 75 MG tablet Take 75 mg by mouth daily. 08/13/21   [provider]  colchicine 0.6 MG tablet Take 0.6 mg by mouth as needed. 07/08/22   [provider]  glipiZIDE (GLUCOTROL XL) 5 MG 24 hr tablet Take 1 tablet (5 mg total) by mouth daily with breakfast. 07/24/22   Nida, Denman George, MD  ibuprofen (ADVIL) 200 MG tablet Take 400 mg by mouth every 6 (six) hours as needed for mild  pain or moderate pain.    [provider]  indomethacin (INDOCIN) 50 MG capsule Take 50 mg by mouth 3 (three) times daily as needed. 07/08/22   [provider]  lipase/protease/amylase (CREON) 36000 UNITS CPEP capsule Take 1 capsule (36,000 Units total) by mouth 3 (three) times daily with meals AND 1 capsule (36,000 Units total) 3 (three) times daily before meals. 08/22/22   Roma Kayser, MD  lisinopril (ZESTRIL) 20 MG tablet Take 20 mg by mouth daily. 04/05/23 08/03/23  [provider]  metFORMIN (GLUCOPHAGE) 1000 MG tablet Take 1,000 mg by mouth 2 (two) times daily with a meal.    [provider]  metoprolol succinate (TOPROL-XL) 50 MG 24 hr tablet Take 1 tablet (50 mg total) by mouth daily. Patient taking differently: Take 50 mg by mouth in the morning and at bedtime. 04/18/23   Horton, Mayer Masker, MD  nitroGLYCERIN (NITROSTAT) 0.4 MG SL tablet Place 1 tablet (0.4 mg total) under the tongue every 5 (five) minutes as needed for chest pain. 02/27/16   Laqueta Linden, MD  olmesartan (BENICAR) 40 MG tablet Take 40 mg by mouth daily.    [provider]  pantoprazole (PROTONIX) 40 MG tablet Take 40 mg by mouth daily. 06/11/22   [provider]  simethicone (MYLICON) 125 MG chewable tablet Chew 125 mg by mouth every 6 (six) hours as needed for flatulence.    [provider]  Allergies    Patient has no known allergies.    Review of Systems   Review of Systems  Physical Exam Updated Vital Signs BP (!) 193/81   Pulse (!) 51   Temp 97.7 F (36.5 C) (Oral)   Resp (!) 23   Ht 5\' 2"  (1.575 m)   Wt 53.1 kg   SpO2 95%   BMI 21.40 kg/m  Physical Exam Vitals and nursing note reviewed.  Constitutional:      General: He is not in acute distress.    Appearance: He is well-developed.  HENT:     Head: Normocephalic and atraumatic.     Right Ear: External ear normal.     Left Ear: External ear normal.     Nose: Nose normal.   Eyes:     Extraocular Movements: Extraocular movements intact.     Conjunctiva/sclera: Conjunctivae normal.     Pupils: Pupils are equal, round, and reactive to light.  Cardiovascular:     Rate and Rhythm: Normal rate and regular rhythm.     Heart sounds: Normal heart sounds.  Pulmonary:     Effort: Pulmonary effort is normal. No respiratory distress.     Breath sounds: Rales (Bibasilar) present.  Musculoskeletal:     Cervical back: Normal range of motion and neck supple.     Right lower leg: No edema.     Left lower leg: No edema.  Skin:    General: Skin is warm and dry.  Neurological:     General: No focal deficit present.     Mental Status: He is alert. Mental status is at baseline.     Cranial Nerves: No cranial nerve deficit.     Sensory: No sensory deficit.     Motor: No weakness.  Psychiatric:        Mood and Affect: Mood normal.        Behavior: Behavior normal.     ED Results / Procedures / Treatments   Labs (all labs ordered are listed, but only abnormal results are displayed) Labs Reviewed  BASIC METABOLIC PANEL WITH GFR - Abnormal; Notable for the following components:      Result Value   Glucose, Bld 220 (*)    All other components within normal limits  BRAIN NATRIURETIC PEPTIDE - Abnormal; Notable for the following components:   B Natriuretic Peptide 739.0 (*)    All other components within normal limits  CBG MONITORING, ED - Abnormal; Notable for the following components:   Glucose-Capillary 205 (*)    All other components within normal limits  CBC WITH DIFFERENTIAL/PLATELET    EKG EKG Interpretation Date/Time:  Thursday May 01 2023 16:25:25 EDT Ventricular Rate:  59 PR Interval:  206 QRS Duration:  90 QT Interval:  446 QTC Calculation: 441 R Axis:   86  Text Interpretation: Unusual P axis, possible ectopic atrial bradycardia Cannot rule out Inferior infarct , age undetermined Nonspecific ST abnormality Confirmed by Vonita Moss [id number]) on  05/01/2023 7:17:03 PM  Radiology No results found.  Procedures Procedures    Medications Ordered in ED Medications  hydrALAZINE (APRESOLINE) tablet 25 mg (25 mg Oral Given 05/01/23 1830)  amLODipine (NORVASC) tablet 10 mg (10 mg Oral Given 05/01/23 1938)    ED Course/ Medical Decision Making/ A&P                                 Medical Decision Making Amount and/or  Complexity of Data Reviewed Labs: ordered. Radiology: ordered.  Risk Prescription drug management.   Kenneth Kelly is a 73 y.o. male with comorbidities that complicate the patient evaluation including CAD status post CABG, hypertension, hyperlipidemia, diabetes, and cryptogenic left occipital infarct who presents to the emergency department with elevated blood pressure.    Initial Ddx:  Hypertensive emergency, ICH, CHF, MI, asymptomatic hypertension  MDM:  Patient presents emergency department with elevated blood pressure that appears to be subacute since his stroke.  At this point in time does not have any symptoms of be concerning for hypertensive emergency such as severe headache, chest pain, or shortness of breath.  Does have some faint Rales on exam but is not hypoxic so will obtain BNP and chest x-ray along with basic labs.    Plan:  CBC BMP BNP Chest x-ray Amlodipine Hydralazine  ED Summary/Re-evaluation:  Labs show the patient has elevated BNP.  Suspect this is chronic due to his elevated blood pressures and uncontrolled hypertension.  Not acutely short of breath or hypoxic so do not feel that he will need admission for this.  Chest x-ray without pulmonary edema or other acute abnormality.  Remainder of his lab work looked okay.  Patient prescribed amlodipine and instructed to follow-up with his primary doctor in several days.  This patient presents to the ED for concern of complaints listed in HPI, this involves an extensive number of treatment options, and is a complaint that carries with it a high  risk of complications and morbidity. Disposition including potential need for admission considered.   Dispo: DC Home. Return precautions discussed including, but not limited to, those listed in the AVS. Allowed pt time to ask questions which were answered fully prior to dc.  Additional history obtained from family Records reviewed Outpatient Clinic Notes The following labs were independently interpreted: Chemistry and show no acute abnormality I independently reviewed the following imaging with scope of interpretation limited to determining acute life threatening conditions related to emergency care: Chest x-ray and agree with the radiologist interpretation with the following exceptions: none I personally reviewed and interpreted cardiac monitoring: normal sinus rhythm  I personally reviewed and interpreted the pt's EKG: see above for interpretation  I have reviewed the patients home medications and made adjustments as needed Social Determinants of health:  Geriatric    Final Clinical Impression(s) / ED Diagnoses Final diagnoses:  Uncontrolled hypertension    Rx / DC Orders ED Discharge Orders          Ordered    amLODipine (NORVASC) 10 MG tablet  Daily        05/01/23 1930              Rondel Baton, MD 05/05/23 1422

## 2023-05-01 NOTE — Discharge Instructions (Signed)
 You were seen for your high blood pressure (hypertension) in the emergency department.   At home, please take the medications that you were prescribed for your blood pressure.    Check your MyChart online for the results of any tests that had not resulted by the time you left the emergency department.   Follow-up with your primary doctor in 2-3 days regarding your visit.    Return immediately to the emergency department if you experience any of the following: Severe headache, vision changes, numbness or weakness of your arms or legs, difficulty breathing, chest pain, or any other concerning symptoms.    Thank you for visiting our Emergency Department. It was a pleasure taking care of you today.

## 2023-05-01 NOTE — ED Provider Triage Note (Signed)
 Emergency Medicine Provider Triage Evaluation Note  Kenneth Kelly , a 73 y.o. male  was evaluated in triage.  Pt complains of HTN.  Review of Systems  Positive: No new symptoms since CVA 1 month ago Negative: Vomiting, headache, chest pain, vision changes  Physical Exam  BP (!) 216/84 (BP Location: Right Arm)   Pulse 60   Temp 97.7 F (36.5 C) (Oral)   Resp 16   Ht 5\' 2"  (1.575 m)   Wt 53.1 kg   SpO2 90%   BMI 21.40 kg/m  Gen:   Awake, no distress   Resp:  Normal effort  MSK:   Moves extremities without difficulty  Other:    Medical Decision Making  Medically screening exam initiated at 5:27 PM.  Appropriate orders placed.  Kenneth Kelly was informed that the remainder of the evaluation will be completed by another provider, this initial triage assessment does not replace that evaluation, and the importance of remaining in the ED until their evaluation is complete.  Patient BIB brother/SIL for help with refractory hypertension. He had a stroke one month ago and blood pressure has been progressively worsening, today 222/98 at home despite doubling metoprolol 4 days ago and adding another medication (PCP). Patient denies symptoms.    Elpidio Anis, PA-C 05/01/23 1729

## 2023-05-05 DIAGNOSIS — F17210 Nicotine dependence, cigarettes, uncomplicated: Secondary | ICD-10-CM | POA: Diagnosis not present

## 2023-05-05 DIAGNOSIS — Z7982 Long term (current) use of aspirin: Secondary | ICD-10-CM | POA: Diagnosis not present

## 2023-05-05 DIAGNOSIS — I2510 Atherosclerotic heart disease of native coronary artery without angina pectoris: Secondary | ICD-10-CM | POA: Diagnosis not present

## 2023-05-05 DIAGNOSIS — Z604 Social exclusion and rejection: Secondary | ICD-10-CM | POA: Diagnosis not present

## 2023-05-05 DIAGNOSIS — E119 Type 2 diabetes mellitus without complications: Secondary | ICD-10-CM | POA: Diagnosis not present

## 2023-05-05 DIAGNOSIS — Z556 Problems related to health literacy: Secondary | ICD-10-CM | POA: Diagnosis not present

## 2023-05-05 DIAGNOSIS — Z7984 Long term (current) use of oral hypoglycemic drugs: Secondary | ICD-10-CM | POA: Diagnosis not present

## 2023-05-05 DIAGNOSIS — Z7902 Long term (current) use of antithrombotics/antiplatelets: Secondary | ICD-10-CM | POA: Diagnosis not present

## 2023-05-05 DIAGNOSIS — I10 Essential (primary) hypertension: Secondary | ICD-10-CM | POA: Diagnosis not present

## 2023-05-06 ENCOUNTER — Ambulatory Visit
Admission: RE | Admit: 2023-05-06 | Discharge: 2023-05-06 | Disposition: A | Discharge status: Home / Self Care | Source: Ambulatory Visit | Attending: Neurology

## 2023-05-06 ENCOUNTER — Ambulatory Visit
Admission: RE | Admit: 2023-05-06 | Discharge: 2023-05-06 | Disposition: A | Discharge status: Home / Self Care | Source: Ambulatory Visit | Attending: Neurology | Admitting: Neurology

## 2023-05-06 DIAGNOSIS — I10 Essential (primary) hypertension: Secondary | ICD-10-CM | POA: Diagnosis not present

## 2023-05-06 DIAGNOSIS — I63532 Cerebral infarction due to unspecified occlusion or stenosis of left posterior cerebral artery: Secondary | ICD-10-CM | POA: Diagnosis not present

## 2023-05-06 DIAGNOSIS — I739 Peripheral vascular disease, unspecified: Secondary | ICD-10-CM | POA: Diagnosis not present

## 2023-05-06 DIAGNOSIS — Z6821 Body mass index (BMI) 21.0-21.9, adult: Secondary | ICD-10-CM | POA: Diagnosis not present

## 2023-05-06 DIAGNOSIS — I6522 Occlusion and stenosis of left carotid artery: Secondary | ICD-10-CM | POA: Diagnosis not present

## 2023-05-06 DIAGNOSIS — S41112A Laceration without foreign body of left upper arm, initial encounter: Secondary | ICD-10-CM | POA: Diagnosis not present

## 2023-05-06 MED ORDER — IOPAMIDOL (ISOVUE-370) INJECTION 76%
75.0000 mL | Freq: Once | INTRAVENOUS | Status: AC | PRN
  Administered 2023-05-06: 75 mL via INTRAVENOUS

## 2023-05-07 ENCOUNTER — Telehealth: Payer: Self-pay | Admitting: Neurology

## 2023-05-07 DIAGNOSIS — F17210 Nicotine dependence, cigarettes, uncomplicated: Secondary | ICD-10-CM | POA: Diagnosis not present

## 2023-05-07 DIAGNOSIS — Z604 Social exclusion and rejection: Secondary | ICD-10-CM | POA: Diagnosis not present

## 2023-05-07 DIAGNOSIS — Z556 Problems related to health literacy: Secondary | ICD-10-CM | POA: Diagnosis not present

## 2023-05-07 DIAGNOSIS — E119 Type 2 diabetes mellitus without complications: Secondary | ICD-10-CM | POA: Diagnosis not present

## 2023-05-07 DIAGNOSIS — I10 Essential (primary) hypertension: Secondary | ICD-10-CM | POA: Diagnosis not present

## 2023-05-07 DIAGNOSIS — Z7984 Long term (current) use of oral hypoglycemic drugs: Secondary | ICD-10-CM | POA: Diagnosis not present

## 2023-05-07 DIAGNOSIS — I2510 Atherosclerotic heart disease of native coronary artery without angina pectoris: Secondary | ICD-10-CM | POA: Diagnosis not present

## 2023-05-07 DIAGNOSIS — Z7902 Long term (current) use of antithrombotics/antiplatelets: Secondary | ICD-10-CM | POA: Diagnosis not present

## 2023-05-07 DIAGNOSIS — Z7982 Long term (current) use of aspirin: Secondary | ICD-10-CM | POA: Diagnosis not present

## 2023-05-07 NOTE — Telephone Encounter (Signed)
 Ruby called and LM with AN. She is calling to report CT scan results.   CT head scan: 1. New subcortical infarct within the left occipital lobe measuring 5.3 cm pca vascular territory sub- acute 2. New occluded left posterior cerebral artery at the p2 segment level 3. New faint enhancement of the intracranial right cerebra artery and an apparent severe stenosis within the distal right V4 segment. Taken 05/06/23 at 15:02 ordered by [facility]

## 2023-05-07 NOTE — Telephone Encounter (Signed)
 Noted.  Will be contacting patient with further instruction.  I think that the stroke is the same stroke from a month prior.  Plan to continue dual antiplatelet therapy for full 3 months (until May 26) a which point he will discontinue aspirin and just continue Plavix 75mg  daily alone.

## 2023-05-15 ENCOUNTER — Ambulatory Visit: Admitting: "Endocrinology

## 2023-05-19 ENCOUNTER — Telehealth: Payer: Self-pay | Admitting: "Endocrinology

## 2023-05-19 DIAGNOSIS — I69398 Other sequelae of cerebral infarction: Secondary | ICD-10-CM | POA: Diagnosis not present

## 2023-05-19 DIAGNOSIS — I2510 Atherosclerotic heart disease of native coronary artery without angina pectoris: Secondary | ICD-10-CM | POA: Diagnosis not present

## 2023-05-19 DIAGNOSIS — Z950 Presence of cardiac pacemaker: Secondary | ICD-10-CM | POA: Diagnosis not present

## 2023-05-19 DIAGNOSIS — E1159 Type 2 diabetes mellitus with other circulatory complications: Secondary | ICD-10-CM

## 2023-05-19 DIAGNOSIS — M6281 Muscle weakness (generalized): Secondary | ICD-10-CM | POA: Diagnosis not present

## 2023-05-19 DIAGNOSIS — Z79899 Other long term (current) drug therapy: Secondary | ICD-10-CM | POA: Diagnosis not present

## 2023-05-19 DIAGNOSIS — F01C0 Vascular dementia, severe, without behavioral disturbance, psychotic disturbance, mood disturbance, and anxiety: Secondary | ICD-10-CM | POA: Diagnosis not present

## 2023-05-19 DIAGNOSIS — I6350 Cerebral infarction due to unspecified occlusion or stenosis of unspecified cerebral artery: Secondary | ICD-10-CM | POA: Diagnosis not present

## 2023-05-19 NOTE — Telephone Encounter (Signed)
 Pt has an appt 5/12. It says he needs labs , however I do not see any active labs. Please Advise so I can fax to [facility] at [phone]

## 2023-05-19 NOTE — Telephone Encounter (Signed)
 It looks like he just needs a CMP. I updated his lab order and sent them to [facility].

## 2023-05-19 NOTE — Telephone Encounter (Signed)
 Faxed to [place] at [facility]

## 2023-05-19 NOTE — Progress Notes (Signed)
 Cardiology Office Note:  .   Date:  06/02/2023  ID:  Kenneth Kelly, DOB 07/27/1950, MRN [id number] PCP: Kenneth Amel, MD  [facility] Providers Cardiologist:  Kenneth Lander, MD Electrophysiologist:  Kenneth Sells, MD    History of Present Illness: .   Kenneth Kelly is a 73 y.o. male  with a PMH of AAA, infrarenal aortic aneurysm, HTN, CAD, s/p CABG x 3 in 2001, carotid artery disease, past hx of CVA, T2DM, HLD, and tobacco abuse NST 2018 that was low risk, showed mild apical inferior ischemia. Echocardiogram in 2022 revealed normal EF, no regional wall motion abnormalities, grade 1 DD, mild RV dysfunction.  CVA at an outside hospital 03/2023 and in ED twice with elevated BP's. Amlodipine  added to metoprolol  and lisinopril . Neuro plans ASA/Plavix  until May 26 then Plavix  75 mg daily alone. ILR interrogation 05/27/33 no episodes.  Patient comes in with his daughter in law. Denies chest pain, dyspnea, edema. She says his memory is poor and he can't find his way to the bathroom at [facility] and wets himself. Trouble with vision. Walks all over the facility but in a wheel chair today. ASA isn't on his med list.   ROS:    Studies Reviewed: Kenneth Kelly         Prior CV Studies:   US  AAA 12/2022 Summary:  Abdominal Aorta: There is evidence of abnormal dilatation of the distal  Abdominal aorta. Stable at 4.28 cm    Carotid doppler 12/2022 Summary:  Right Carotid: Velocities in the right ICA are consistent with a 1-39%  stenosis.                Non-hemodynamically significant plaque <50% noted in the  CCA. The                 ECA appears <50% stenosed. Patent right ICA endarterectomy  site.   Left Carotid: Velocities in the left ICA are consistent with a 60-79%  stenosis.               Non-hemodynamically significant plaque <50% noted in the  CCA. The                ECA appears <50% stenosed.   Vertebrals:  Bilateral vertebral arteries demonstrate antegrade flow.   Subclavians: Normal flow hemodynamics were seen in bilateral subclavian               arteries.   *See table(s) above for measurements and observations.  Suggest follow up study in 12 months.    Electronically signed by Kenneth Mediate MD on 12/16/2022 at 10:35:55 AM.        Final     ABI's on 10/10/2021: Summary:  Right: Resting right ankle-brachial index indicates moderate right lower  extremity arterial disease. The right toe-brachial index is abnormal.   Left: Resting left ankle-brachial index indicates moderate left lower  extremity arterial disease. The left toe-brachial index is abnormal.   Bilateral carotid duplex 12/2022 Summary:  Right Carotid: Velocities in the right ICA are consistent with a 81-39%                stenosis.   Left Carotid: Velocities in the left ICA are consistent with a 60-79%  stenosis.        AAA vascular ultrasound on September 12, 2021: Summary:  Abdominal Aorta: There is evidence of abnormal dilatation of the mid and  distal Abdominal aorta. There is evidence of abnormal dilation of the Left  Common Iliac artery. The largest aortic measurement is 4.2 cm. The largest  aortic diameter remains  essentially unchanged compared to prior exam. Previous diameter  measurement was obtained on 09/19/20.   Echocardiogram on July 22, 2020: 1. Left ventricular ejection fraction, by estimation, is 60 to 65%. The  left ventricle has normal function. The left ventricle has no regional  wall motion abnormalities. There is moderate asymmetric left ventricular  hypertrophy of the basal-septal  segment. Left ventricular diastolic parameters are consistent with Grade I  diastolic dysfunction (impaired relaxation).   2. Right ventricular systolic function is mildly reduced. The right  ventricular size is mildly enlarged. Tricuspid regurgitation signal is  inadequate for assessing PA pressure.   3. Left atrial size was moderately dilated.   4. The mitral valve  is abnormal. Trivial mitral valve regurgitation.   5. The aortic valve is tricuspid. Aortic valve regurgitation is not  visualized. Mild aortic valve sclerosis is present, with no evidence of  aortic valve stenosis. Aortic valve mean gradient measures 3.5 mmHg.   6. The inferior vena cava is normal in size with <50% respiratory  variability, suggesting right atrial pressure of 8 mmHg.   7. Agitated saline contrast bubble study was negative, with no evidence  of any interatrial shunt.   Comparison(s): No prior Echocardiogram.   Myoview  on April 23, 2016: No diagnostic ST segment changes to indicate ischemia. Small, mild intensity, reversible mid to apical inferior defect that is consistent with ischemia. This is a low risk study. Nuclear stress EF: 57%.  Risk Assessment/Calculations:             Physical Exam:   VS:  BP (!) 118/58 (BP Location: Right Arm, Patient Position: Sitting, Cuff Size: Normal)   Pulse 72   Ht 5' (1.524 m)   Wt 12 lb 12.8 oz (5.806 kg)   SpO2 92%   BMI 2.50 kg/m    Wt Readings from Last 3 Encounters:  06/02/23 12 lb 12.8 oz (5.806 kg)  05/01/23 117 lb (53.1 kg)  04/29/23 117 lb (53.1 kg)    ZOX:WRUE, elderly in no acute distress NECK: No JVD; No carotid bruits CARDIAC:  RRR, no murmurs, rubs, gallops RESPIRATORY:  diffuse wheezing and rhonchi  ABDOMEN: Soft, non-tender, non-distended EXTREMITIES:  No edema; No deformity   ASSESSMENT AND PLAN: .    CAD, s/p CABG x 3 in 2001 Stable with no anginal symptoms. Resume aspirin , continue Plavix , Lopressor , losartan, and nitroglycerin  as needed.   CVA at outside hospital 03/2023 on Plavix  and ASA until the end of May then Plavix  alone. Resume ASA  HTN-BP running high since CVA-amlodipine  added to metoprolol  and losartan, excellent today   Carotid artery disease, s/p R CEA in 11/2021 He is followed by vascular surgery.  Underwent right CEA in October 2023. Duplex 12/2022 RICA 1-39%, LICA 60-79% due for  repeat 12/2023.    HLD 11/2022 LDL 99. Goal LDL < 55. His  atorvastatin  was increased to 80 mg daily and repeat FLP/LFT in 2 months but not done. Will order. Continue rest of medication regimen. Heart healthy diet and regular cardiovascular exercise encouraged.    AAA, infrarenal aortic aneurysm He has been followed by vascular surgery for this.  Last AAA duplex 12/2022 stable AAA 4.28 cm.   Tobacco abuse Smoking cessation encouraged and discussed. Down to 5 cigarettes daily since living at [facility]. Complete cessation discussed.        Dispo: f/u in 6 months.  Signed, Ludwig Safer  Havery Lions, PA-C

## 2023-05-20 DIAGNOSIS — K219 Gastro-esophageal reflux disease without esophagitis: Secondary | ICD-10-CM | POA: Diagnosis not present

## 2023-05-20 DIAGNOSIS — M1711 Unilateral primary osteoarthritis, right knee: Secondary | ICD-10-CM | POA: Diagnosis not present

## 2023-05-20 DIAGNOSIS — I6350 Cerebral infarction due to unspecified occlusion or stenosis of unspecified cerebral artery: Secondary | ICD-10-CM | POA: Diagnosis not present

## 2023-05-20 DIAGNOSIS — F01C0 Vascular dementia, severe, without behavioral disturbance, psychotic disturbance, mood disturbance, and anxiety: Secondary | ICD-10-CM | POA: Diagnosis not present

## 2023-05-20 DIAGNOSIS — E119 Type 2 diabetes mellitus without complications: Secondary | ICD-10-CM | POA: Diagnosis not present

## 2023-05-20 DIAGNOSIS — Z79899 Other long term (current) drug therapy: Secondary | ICD-10-CM | POA: Diagnosis not present

## 2023-05-20 DIAGNOSIS — I69398 Other sequelae of cerebral infarction: Secondary | ICD-10-CM | POA: Diagnosis not present

## 2023-05-20 DIAGNOSIS — Z7189 Other specified counseling: Secondary | ICD-10-CM | POA: Diagnosis not present

## 2023-05-20 DIAGNOSIS — M6281 Muscle weakness (generalized): Secondary | ICD-10-CM | POA: Diagnosis not present

## 2023-05-21 DIAGNOSIS — M6281 Muscle weakness (generalized): Secondary | ICD-10-CM | POA: Diagnosis not present

## 2023-05-21 DIAGNOSIS — E119 Type 2 diabetes mellitus without complications: Secondary | ICD-10-CM | POA: Diagnosis not present

## 2023-05-21 DIAGNOSIS — F01C0 Vascular dementia, severe, without behavioral disturbance, psychotic disturbance, mood disturbance, and anxiety: Secondary | ICD-10-CM | POA: Diagnosis not present

## 2023-05-21 DIAGNOSIS — I2510 Atherosclerotic heart disease of native coronary artery without angina pectoris: Secondary | ICD-10-CM | POA: Diagnosis not present

## 2023-05-21 DIAGNOSIS — E785 Hyperlipidemia, unspecified: Secondary | ICD-10-CM | POA: Diagnosis not present

## 2023-05-21 DIAGNOSIS — K219 Gastro-esophageal reflux disease without esophagitis: Secondary | ICD-10-CM | POA: Diagnosis not present

## 2023-05-21 DIAGNOSIS — I6350 Cerebral infarction due to unspecified occlusion or stenosis of unspecified cerebral artery: Secondary | ICD-10-CM | POA: Diagnosis not present

## 2023-05-21 DIAGNOSIS — I69398 Other sequelae of cerebral infarction: Secondary | ICD-10-CM | POA: Diagnosis not present

## 2023-05-21 DIAGNOSIS — M159 Polyosteoarthritis, unspecified: Secondary | ICD-10-CM | POA: Diagnosis not present

## 2023-05-21 DIAGNOSIS — M109 Gout, unspecified: Secondary | ICD-10-CM | POA: Diagnosis not present

## 2023-05-21 DIAGNOSIS — Z950 Presence of cardiac pacemaker: Secondary | ICD-10-CM | POA: Diagnosis not present

## 2023-05-22 DIAGNOSIS — E785 Hyperlipidemia, unspecified: Secondary | ICD-10-CM | POA: Diagnosis not present

## 2023-05-22 DIAGNOSIS — M159 Polyosteoarthritis, unspecified: Secondary | ICD-10-CM | POA: Diagnosis not present

## 2023-05-22 DIAGNOSIS — F17210 Nicotine dependence, cigarettes, uncomplicated: Secondary | ICD-10-CM | POA: Diagnosis not present

## 2023-05-22 DIAGNOSIS — Z79899 Other long term (current) drug therapy: Secondary | ICD-10-CM | POA: Diagnosis not present

## 2023-05-22 DIAGNOSIS — I69398 Other sequelae of cerebral infarction: Secondary | ICD-10-CM | POA: Diagnosis not present

## 2023-05-22 DIAGNOSIS — F17200 Nicotine dependence, unspecified, uncomplicated: Secondary | ICD-10-CM | POA: Diagnosis not present

## 2023-05-22 DIAGNOSIS — M109 Gout, unspecified: Secondary | ICD-10-CM | POA: Diagnosis not present

## 2023-05-22 DIAGNOSIS — M6281 Muscle weakness (generalized): Secondary | ICD-10-CM | POA: Diagnosis not present

## 2023-05-23 DIAGNOSIS — M6281 Muscle weakness (generalized): Secondary | ICD-10-CM | POA: Diagnosis not present

## 2023-05-23 DIAGNOSIS — I69398 Other sequelae of cerebral infarction: Secondary | ICD-10-CM | POA: Diagnosis not present

## 2023-05-26 ENCOUNTER — Ambulatory Visit (INDEPENDENT_AMBULATORY_CARE_PROVIDER_SITE_OTHER): Payer: Medicare HMO

## 2023-05-26 DIAGNOSIS — Z1321 Encounter for screening for nutritional disorder: Secondary | ICD-10-CM | POA: Diagnosis not present

## 2023-05-26 DIAGNOSIS — E119 Type 2 diabetes mellitus without complications: Secondary | ICD-10-CM | POA: Diagnosis not present

## 2023-05-26 DIAGNOSIS — E785 Hyperlipidemia, unspecified: Secondary | ICD-10-CM | POA: Diagnosis not present

## 2023-05-26 DIAGNOSIS — M6281 Muscle weakness (generalized): Secondary | ICD-10-CM | POA: Diagnosis not present

## 2023-05-26 DIAGNOSIS — I639 Cerebral infarction, unspecified: Secondary | ICD-10-CM | POA: Diagnosis not present

## 2023-05-26 DIAGNOSIS — I2510 Atherosclerotic heart disease of native coronary artery without angina pectoris: Secondary | ICD-10-CM | POA: Diagnosis not present

## 2023-05-26 DIAGNOSIS — Z951 Presence of aortocoronary bypass graft: Secondary | ICD-10-CM | POA: Diagnosis not present

## 2023-05-26 DIAGNOSIS — I69398 Other sequelae of cerebral infarction: Secondary | ICD-10-CM | POA: Diagnosis not present

## 2023-05-26 DIAGNOSIS — I10 Essential (primary) hypertension: Secondary | ICD-10-CM | POA: Diagnosis not present

## 2023-05-26 DIAGNOSIS — E559 Vitamin D deficiency, unspecified: Secondary | ICD-10-CM | POA: Diagnosis not present

## 2023-05-26 DIAGNOSIS — I714 Abdominal aortic aneurysm, without rupture, unspecified: Secondary | ICD-10-CM | POA: Diagnosis not present

## 2023-05-27 DIAGNOSIS — I69398 Other sequelae of cerebral infarction: Secondary | ICD-10-CM | POA: Diagnosis not present

## 2023-05-27 DIAGNOSIS — M6281 Muscle weakness (generalized): Secondary | ICD-10-CM | POA: Diagnosis not present

## 2023-05-27 LAB — CUP PACEART REMOTE DEVICE CHECK
Date Time Interrogation Session: 20250420230611
Implantable Pulse Generator Implant Date: 20220620

## 2023-05-28 ENCOUNTER — Encounter: Payer: Self-pay | Admitting: Internal Medicine

## 2023-05-28 DIAGNOSIS — M6281 Muscle weakness (generalized): Secondary | ICD-10-CM | POA: Diagnosis not present

## 2023-05-28 DIAGNOSIS — I69398 Other sequelae of cerebral infarction: Secondary | ICD-10-CM | POA: Diagnosis not present

## 2023-05-29 ENCOUNTER — Ambulatory Visit: Payer: Medicare HMO | Admitting: Nurse Practitioner

## 2023-05-29 DIAGNOSIS — M6281 Muscle weakness (generalized): Secondary | ICD-10-CM | POA: Diagnosis not present

## 2023-05-29 DIAGNOSIS — I69398 Other sequelae of cerebral infarction: Secondary | ICD-10-CM | POA: Diagnosis not present

## 2023-05-30 DIAGNOSIS — I69398 Other sequelae of cerebral infarction: Secondary | ICD-10-CM | POA: Diagnosis not present

## 2023-05-30 DIAGNOSIS — M6281 Muscle weakness (generalized): Secondary | ICD-10-CM | POA: Diagnosis not present

## 2023-05-31 NOTE — Progress Notes (Signed)
 Ordered to look for heart failure

## 2023-06-01 DIAGNOSIS — M6281 Muscle weakness (generalized): Secondary | ICD-10-CM | POA: Diagnosis not present

## 2023-06-01 DIAGNOSIS — I69398 Other sequelae of cerebral infarction: Secondary | ICD-10-CM | POA: Diagnosis not present

## 2023-06-02 ENCOUNTER — Ambulatory Visit: Payer: Medicare HMO | Attending: Physician Assistant | Admitting: Physician Assistant

## 2023-06-02 ENCOUNTER — Encounter: Payer: Self-pay | Admitting: Physician Assistant

## 2023-06-02 VITALS — BP 118/58 | HR 72 | Ht 60.0 in | Wt <= 1120 oz

## 2023-06-02 DIAGNOSIS — I10 Essential (primary) hypertension: Secondary | ICD-10-CM | POA: Diagnosis not present

## 2023-06-02 DIAGNOSIS — Z720 Tobacco use: Secondary | ICD-10-CM

## 2023-06-02 DIAGNOSIS — I2583 Coronary atherosclerosis due to lipid rich plaque: Secondary | ICD-10-CM

## 2023-06-02 DIAGNOSIS — Z8673 Personal history of transient ischemic attack (TIA), and cerebral infarction without residual deficits: Secondary | ICD-10-CM

## 2023-06-02 DIAGNOSIS — I714 Abdominal aortic aneurysm, without rupture, unspecified: Secondary | ICD-10-CM

## 2023-06-02 DIAGNOSIS — E785 Hyperlipidemia, unspecified: Secondary | ICD-10-CM

## 2023-06-02 DIAGNOSIS — I2510 Atherosclerotic heart disease of native coronary artery without angina pectoris: Secondary | ICD-10-CM | POA: Diagnosis not present

## 2023-06-02 DIAGNOSIS — I69398 Other sequelae of cerebral infarction: Secondary | ICD-10-CM | POA: Diagnosis not present

## 2023-06-02 DIAGNOSIS — M6281 Muscle weakness (generalized): Secondary | ICD-10-CM | POA: Diagnosis not present

## 2023-06-02 NOTE — Patient Instructions (Signed)
 Medication Instructions:  Continue Aspirin  81 mg daily until 06/30/23, then STOP  Labwork: Fasting Lipid   Testing/Procedures: None today  Follow-Up: 6 months  Any Other Special Instructions Will Be Listed Below (If Applicable).  If you need a refill on your cardiac medications before your next appointment, please call your pharmacy.

## 2023-06-04 DIAGNOSIS — I69398 Other sequelae of cerebral infarction: Secondary | ICD-10-CM | POA: Diagnosis not present

## 2023-06-04 DIAGNOSIS — M6281 Muscle weakness (generalized): Secondary | ICD-10-CM | POA: Diagnosis not present

## 2023-06-05 DIAGNOSIS — I69398 Other sequelae of cerebral infarction: Secondary | ICD-10-CM | POA: Diagnosis not present

## 2023-06-05 DIAGNOSIS — M6281 Muscle weakness (generalized): Secondary | ICD-10-CM | POA: Diagnosis not present

## 2023-06-06 DIAGNOSIS — M6281 Muscle weakness (generalized): Secondary | ICD-10-CM | POA: Diagnosis not present

## 2023-06-06 DIAGNOSIS — I69398 Other sequelae of cerebral infarction: Secondary | ICD-10-CM | POA: Diagnosis not present

## 2023-06-09 DIAGNOSIS — M6281 Muscle weakness (generalized): Secondary | ICD-10-CM | POA: Diagnosis not present

## 2023-06-09 DIAGNOSIS — I69398 Other sequelae of cerebral infarction: Secondary | ICD-10-CM | POA: Diagnosis not present

## 2023-06-13 DIAGNOSIS — Z79899 Other long term (current) drug therapy: Secondary | ICD-10-CM | POA: Diagnosis not present

## 2023-06-14 LAB — LAB REPORT - SCANNED: EGFR: 56

## 2023-06-16 ENCOUNTER — Telehealth: Payer: Self-pay

## 2023-06-16 ENCOUNTER — Encounter: Payer: Self-pay | Admitting: "Endocrinology

## 2023-06-16 ENCOUNTER — Ambulatory Visit (INDEPENDENT_AMBULATORY_CARE_PROVIDER_SITE_OTHER): Admitting: "Endocrinology

## 2023-06-16 VITALS — BP 132/64 | HR 72

## 2023-06-16 DIAGNOSIS — M7989 Other specified soft tissue disorders: Secondary | ICD-10-CM | POA: Diagnosis not present

## 2023-06-16 DIAGNOSIS — M109 Gout, unspecified: Secondary | ICD-10-CM | POA: Diagnosis not present

## 2023-06-16 DIAGNOSIS — E1159 Type 2 diabetes mellitus with other circulatory complications: Secondary | ICD-10-CM

## 2023-06-16 DIAGNOSIS — Z79899 Other long term (current) drug therapy: Secondary | ICD-10-CM | POA: Diagnosis not present

## 2023-06-16 DIAGNOSIS — M25572 Pain in left ankle and joints of left foot: Secondary | ICD-10-CM | POA: Diagnosis not present

## 2023-06-16 DIAGNOSIS — Z7984 Long term (current) use of oral hypoglycemic drugs: Secondary | ICD-10-CM | POA: Diagnosis not present

## 2023-06-16 DIAGNOSIS — K8681 Exocrine pancreatic insufficiency: Secondary | ICD-10-CM | POA: Diagnosis not present

## 2023-06-16 DIAGNOSIS — E782 Mixed hyperlipidemia: Secondary | ICD-10-CM

## 2023-06-16 DIAGNOSIS — M79675 Pain in left toe(s): Secondary | ICD-10-CM | POA: Diagnosis not present

## 2023-06-16 DIAGNOSIS — R52 Pain, unspecified: Secondary | ICD-10-CM | POA: Diagnosis not present

## 2023-06-16 DIAGNOSIS — M79672 Pain in left foot: Secondary | ICD-10-CM | POA: Diagnosis not present

## 2023-06-16 DIAGNOSIS — I10 Essential (primary) hypertension: Secondary | ICD-10-CM

## 2023-06-16 DIAGNOSIS — F17200 Nicotine dependence, unspecified, uncomplicated: Secondary | ICD-10-CM | POA: Diagnosis not present

## 2023-06-16 MED ORDER — GLIPIZIDE ER 2.5 MG PO TB24: 2.5000 mg | ORAL_TABLET | Freq: Every day | ORAL | 1 refills | Status: AC

## 2023-06-16 NOTE — Telephone Encounter (Signed)
 Alert received from [employer] for AF episode. Event occurred 5/9 @ 18:11, duration , max HR 136, can not rule out AF/AFL. No OAC, DAPT per EPIC.  Spoke to patients wife Marily Shows who oversees his care and takes him to apts. Patient resides at [facility] in [place], [place].  Advised finding AF/AFL on ILR report. Discussed with Dr. Lawana Pray who recommends AF clinic referral to discuss OAC. Marily Shows requested calls for apt to come to her. Judys number is [phone].

## 2023-06-16 NOTE — Progress Notes (Signed)
 06/16/2023, 3:15 PM   Endocrinology follow-up note  Subjective:    Patient ID: Kenneth Kelly, male    DOB: 24-Nov-1950.  Kenneth Kelly is being seen in follow-up after he was seen in consultation for management of currently uncontrolled symptomatic diabetes requested by  Minus Amel, MD.   Past Medical History:  Diagnosis Date   Abdominal aortic aneurysm (AAA) [facility]) followed by cardiologist-- dr Wanetta Guthrie   per last CT 01-12-2016  Infrarenal aortic aneurysm 3.4cm   Arthritis    Hip   Bladder tumor    CAD (coronary artery disease)    CARDIOLOGIST-  DR Wanetta Guthrie---  HX CABG X3 08-15-1999   First degree AV block    Gout    History of bladder cancer urologist-  dr Inga Manges   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   History of fracture of pelvis    Hyperlipidemia 02/21/2009   Qualifier: Diagnosis of  By: Via LPN, Lynn     Hypertension    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   LIMA to LCFx, SVG to RCA, RIMA to LAD   Sinus bradycardia on ECG    Stroke (HCC) 2022   Type 2 diabetes mellitus (HCC)     Past Surgical History:  Procedure Laterality Date   CARDIAC CATHETERIZATION  07-20-1999  dr wall   significant 2v CAD, total occlusion LAD an 80% LCFx neither amenable to PCI   CARDIOVASCULAR STRESS TEST  04-23-2016  dr Wanetta Guthrie ([place])   Low risk nuclear study w/ small, mild intensity, reversible mid to apical inferior defect that is consistent to ischemia but no diagnotic ST segment changes to indicate ischemia/  normal LV function and wall motion ,  nuclear stress ef 57%   CORONARY ARTERY BYPASS GRAFT  08/15/1999   dr gerhardt [facility]   x3, LIMA to left circumflex, SVG to RCA, and right internal mammary artery graft to the LAD   CYSTOSCOPY WITH BIOPSY N/A 04/08/2017   Procedure: CYSTOSCOPY;  Surgeon: Homero Luster, MD;  Location: [facility];  Service: Urology;  Laterality: N/A;   CYSTOSCOPY WITH STENT PLACEMENT Right 01/23/2016    Procedure: CYSTOSCOPY WITH STENT PLACEMENT;  Surgeon: Homero Luster, MD;  Location: [facility];  Service: Urology;  Laterality: Right;   ENDARTERECTOMY Right 11/13/2021   Procedure: ENDARTERECTOMY CAROTID;  Surgeon: Adine Hoof, MD;  Location: [facility] OR;  Service: Vascular;  Laterality: Right;   KNEE ARTHROSCOPY Right 2001  approx.   LOOP RECORDER INSERTION N/A 07/24/2020   Procedure: LOOP RECORDER INSERTION;  Surgeon: Tammie Fall, MD;  Location: [facility] INVASIVE CV LAB;  Service: Cardiovascular;  Laterality: N/A;   PATCH ANGIOPLASTY Right 11/13/2021   Procedure: PATCH ANGIOPLASTY USING 1CM X 6CM BOVINE PATCH;  Surgeon: Adine Hoof, MD;  Location: [facility] OR;  Service: Vascular;  Laterality: Right;   POSTERIOR LAMINECTOMY / DECOMPRESSION LUMBAR SPINE  09-01-2009   [facility]   TRANSURETHRAL RESECTION OF BLADDER TUMOR WITH MITOMYCIN -C Bilateral 01/23/2016   Procedure: CYSTOSCOPY ,TRANSURETHRAL RESECTION OF BLADDER TUMOR;  Surgeon: Homero Luster, MD;  Location: [facility];  Service: Urology;  Laterality: Bilateral;    Social History   Socioeconomic History   Marital status: Divorced    Spouse name: Not on file   Number of children: Not on file   Years of education: Not on file   Highest education level: Not on file  Occupational History   Not on file  Tobacco Use  Smoking status: Every Day    Current packs/day: 1.00    Average packs/day: 1 pack/day for 56.0 years (56.0 ttl pk-yrs)    Types: Cigarettes   Smokeless tobacco: Never  Vaping Use   Vaping status: Never Used  Substance and Sexual Activity   Alcohol use: Not Currently    Comment: rare   Drug use: No   Sexual activity: Yes  Other Topics Concern   Not on file  Social History Narrative   Right handed   Drinks caffeine prn   Lives with his brother   One floor home   Social Drivers of Health   Financial Resource Strain: Patient Unable To Answer (04/03/2023)   Received from [facility]   Overall Financial Resource  Strain (CARDIA)    Difficulty of Paying Living Expenses: Patient unable to answer  Food Insecurity: No Food Insecurity (09/04/2020)   Hunger Vital Sign    Worried About Running Out of Food in the Last Year: Never true    Ran Out of Food in the Last Year: Never true  Transportation Needs: No Transportation Needs (09/04/2020)   PRAPARE - Administrator, Civil Service (Medical): No    Lack of Transportation (Non-Medical): No  Physical Activity: Not on file  Stress: Not on file  Social Connections: Not on file    Family History  Problem Relation Age of Onset   Diabetes Mother    Hypertension Father    Heart disease Father    Stroke Father    Heart attack Brother    Heart disease Brother    Heart disease Brother     Outpatient Encounter Medications as of 06/16/2023  Medication Sig   glipiZIDE  (GLUCOTROL  XL) 2.5 MG 24 hr tablet Take 1 tablet (2.5 mg total) by mouth daily with breakfast.   amLODipine  (NORVASC ) 10 MG tablet Take 1 tablet (10 mg total) by mouth daily.   aspirin  81 MG EC tablet Take 1 tablet (81 mg total) by mouth daily. Swallow whole.   atorvastatin  (LIPITOR) 80 MG tablet Take 1 tablet (80 mg total) by mouth daily.   clopidogrel  (PLAVIX ) 75 MG tablet Take 75 mg by mouth daily.   losartan (COZAAR) 100 MG tablet Take 100 mg by mouth daily.   metFORMIN  (GLUCOPHAGE ) 1000 MG tablet Take 1,000 mg by mouth 2 (two) times daily with a meal.   metoprolol  succinate (TOPROL -XL) 50 MG 24 hr tablet Take 1 tablet (50 mg total) by mouth daily.   nitroGLYCERIN  (NITROSTAT ) 0.4 MG SL tablet Place 1 tablet (0.4 mg total) under the tongue every 5 (five) minutes as needed for chest pain.   pantoprazole  (PROTONIX ) 40 MG tablet Take 40 mg by mouth daily.   [DISCONTINUED] colchicine 0.6 MG tablet Take 0.6 mg by mouth as needed. (Patient not taking: Reported on 06/02/2023)   [DISCONTINUED] glipiZIDE  (GLUCOTROL  XL) 5 MG 24 hr tablet Take 1 tablet (5 mg total) by mouth daily with breakfast.  (Patient not taking: Reported on 06/02/2023)   [DISCONTINUED] ibuprofen (ADVIL) 200 MG tablet Take 400 mg by mouth every 6 (six) hours as needed for mild pain or moderate pain. (Patient not taking: Reported on 06/02/2023)   [DISCONTINUED] indomethacin (INDOCIN) 50 MG capsule Take 50 mg by mouth 3 (three) times daily as needed. (Patient not taking: Reported on 06/02/2023)   [DISCONTINUED] lipase/protease/amylase (CREON ) 36000 UNITS CPEP capsule Take 1 capsule (36,000 Units total) by mouth 3 (three) times daily with meals AND 1 capsule (36,000 Units total) 3 (  three) times daily before meals. (Patient not taking: Reported on 06/02/2023)   [DISCONTINUED] simethicone (MYLICON) 125 MG chewable tablet Chew 125 mg by mouth every 6 (six) hours as needed for flatulence. (Patient not taking: Reported on 06/02/2023)   No facility-administered encounter medications on file as of 06/16/2023.    ALLERGIES: No Known Allergies  VACCINATION STATUS: Immunization History  Administered Date(s) Administered   Moderna Sars-Covid-2 Vaccination 05/06/2019, 06/03/2019    Diabetes He presents for his follow-up diabetic visit. He has type 2 diabetes mellitus. Onset time: Patient was diagnosed at approximate age of 49 years. His disease course has been worsening. There are no hypoglycemic associated symptoms. Pertinent negatives for hypoglycemia include no confusion, headaches, pallor or seizures. Pertinent negatives for diabetes include no chest pain, no fatigue, no polydipsia, no polyphagia, no polyuria and no weakness. There are no hypoglycemic complications. Symptoms are worsening. Diabetic complications include a CVA and heart disease. Risk factors for coronary artery disease include dyslipidemia, male sex, hypertension, sedentary lifestyle, tobacco exposure, family history and diabetes mellitus. His weight is fluctuating minimally (She weighed up to 160 pound in the past, however in the last 5 years he has been progressively.   He was put on Creon  during his last visit, however this medication is not on his list.  He denies diarrhea at this time.). He is following a generally unhealthy diet. When asked about meal planning, he reported none. He has not had a previous visit with a dietitian. He never participates in exercise. (He presents to clinic accompanied by his wife.  He is still in nursing home planning to be discharged in near future.  He did not bring his logs or nor blood glucose readings.  His recent A1c was 7.8% increasing from 7% during his last visit.  His medication list does not include glipizide , however shows metformin  the same dose at 1000 mg p.o. twice daily.    Patient gives prior history of heavy alcohol use.  He is also heavy smoker 2 packs a day for more than 50 years.) An ACE inhibitor/angiotensin II receptor blocker is being taken. Eye exam is not current.  Hyperlipidemia This is a chronic problem. The current episode started more than 1 year ago. The problem is controlled. Exacerbating diseases include diabetes. Pertinent negatives include no chest pain, myalgias or shortness of breath. Current antihyperlipidemic treatment includes statins. Risk factors for coronary artery disease include dyslipidemia, diabetes mellitus, hypertension, male sex and a sedentary lifestyle.  Hypertension This is a chronic problem. The current episode started more than 1 year ago. The problem is uncontrolled. Pertinent negatives include no chest pain, headaches, neck pain, palpitations or shortness of breath. Risk factors for coronary artery disease include dyslipidemia, diabetes mellitus, male gender, sedentary lifestyle and smoking/tobacco exposure. Past treatments include ACE inhibitors, beta blockers and calcium  channel blockers. Hypertensive end-organ damage includes CAD/MI and CVA.     Objective:       06/16/2023    1:31 PM 06/02/2023   10:55 AM 05/01/2023    7:00 PM  Vitals with BMI  Height  5\' 0"    Weight  12 lbs  13 oz   BMI  2.5   Systolic 132 118 454  Diastolic 64 58 81  Pulse 72 72 51    BP 132/64   Pulse 72   Wt Readings from Last 3 Encounters:  06/02/23 12 lb 12.8 oz (5.806 kg)  05/01/23 117 lb (53.1 kg)  04/29/23 117 lb (53.1 kg)  CMP ( most recent) CMP     Component Value Date/Time   NA 143 05/01/2023 1740   NA 142 11/14/2022 0747   K 3.8 05/01/2023 1740   CL 104 05/01/2023 1740   CO2 27 05/01/2023 1740   GLUCOSE 220 (H) 05/01/2023 1740   BUN 17 05/01/2023 1740   BUN 35 (H) 11/14/2022 0747   CREATININE 0.90 05/01/2023 1740   CALCIUM  9.1 05/01/2023 1740   PROT 7.2 11/14/2022 0747   ALBUMIN 4.4 11/14/2022 0747   AST 19 11/14/2022 0747   ALT 13 11/14/2022 0747   ALKPHOS 124 (H) 11/14/2022 0747   BILITOT 0.4 11/14/2022 0747   EGFR 56 (L) 11/14/2022 0747   GFRNONAA >60 05/01/2023 1740     Diabetic Labs (most recent): Lab Results  Component Value Date   HGBA1C 7.0 11/28/2022   HGBA1C 8.1 (A) 07/24/2022   HGBA1C 7.6 (H) 10/23/2021     Lipid Panel ( most recent) Lipid Panel     Component Value Date/Time   CHOL 166 11/14/2022 0747   TRIG 99 11/14/2022 0747   HDL 49 11/14/2022 0747   CHOLHDL 3.4 11/14/2022 0747   CHOLHDL 3.2 11/14/2021 0341   VLDL 7 11/14/2021 0341   LDLCALC 99 11/14/2022 0747   LABVLDL 18 11/14/2022 0747      Assessment & Plan:   1. DM type 2 causing vascular disease (HCC)   - Kenneth Kelly has currently uncontrolled symptomatic type 2 DM since  73 years of age.  He presents to clinic accompanied by his wife.  He is still in nursing home planning to be discharged in near future.  He did not bring his logs or nor blood glucose readings.  His recent A1c was 7.8% increasing from 7% during his last visit.  His medication list does not include glipizide , however shows metformin  the same dose at 1000 mg p.o. twice daily.    Patient gives prior history of heavy alcohol use.  He is also heavy smoker 2 packs a day for more than 50  years.  -Recent labs reviewed. - I had a long discussion with him about the possible risk factors and  the pathology behind its diabetes and its complications. -He does not have significant history of obesity.  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  -his diabetes is complicated by coronary artery disease which required open heart surgery in 2020, CVA, comorbid hyperlipidemia/hypertension, prior heavy drinking likely indicating pancreatic failure not only for beta cell function but  also for pancreatic alpha cells.   Heavy smoking and he remains at exceedingly high risk for more acute and chronic complications which include CAD, CVA, CKD, retinopathy, and neuropathy. These are all discussed in detail with him.  - I discussed all available options of managing his diabetes including de-escalation of medications. I have counseled him on Food as Medicine . - Patient is encouraged to switch to  unprocessed or minimally processed  complex starch, adequate protein intake (mainly plant source), minimal liquid fat, plenty of fruits, and vegetables. -  he is advised to stick to a routine mealtimes to eat 3 complete meals a day and snack only when necessary ( to snack only to correct hypoglycemia BG <70 day time or <100 at night).   - he acknowledges that there is a room for improvement in his food and drink choices. - Further Specific Suggestion is made for him to avoid simple carbohydrates  from his diet including  Cakes, Sweet Desserts, Ice Cream, Soda (diet and regular), Sweet Tea, Candies, Chips, Cookies, Store Bought Juices, Alcohol ,  Artificial Sweeteners,  Coffee Creamer, and "Sugar-free" Products. This will help patient to have more stable blood glucose profile and potentially avoid unintended weight gain.  - he will be scheduled with Penny Crumpton, RDN, CDE for individualized diabetes education.  - I have approached him with the following  individualized plan to manage  his diabetes and patient agrees:   -Given his presentation with above target glycemic profile with A1c of 7.8%, he will need additional intervention besides metformin .  He will not be considered for insulin  treatment for now.   - I advised family to continue metformin  1000 mg p.o. twice daily, discussed and resumed glipizide  at 2.5 mg XL p.o. daily at breakfast.    -His diabetes is likely  induced by pancreatic damage from EtOH which may make it necessary for him to need insulin  treatment later.   - Priority will still be avoiding hypoglycemia in this patient.  I advised and family agrees to monitor blood glucose twice a day-daily before breakfast and at bedtime.  He is encouraged to call clinic for hypoglycemia below 70 or hyperglycemia above 300.  He is not a candidate for GLP-1 receptor agonists nor SGLT2 inhibitors because of his body habitus.  If patient loses weight unintentionally or start to have GI symptoms including diarrhea, he may benefit from Creon  intervention. - Specific targets for  A1c;  LDL, HDL,  and Triglycerides were discussed with the patient.  2) Blood Pressure /Hypertension:    - His blood pressure is controlled to target.   he is advised to continue his current medications including lisinopril  10 mg p.o. daily, amlodipine  10 mg p.o. daily, metoprolol  25 p.o. twice daily .   3) Lipids/Hyperlipidemia:   Review of his recent labs show LDL at 99.  He will continue to benefit from atorvastatin  40 mg p.o. nightly.  Side effects and precautions discussed with him.     4)  Weight/Diet:  There is no height or weight on file to calculate BMI.  -    he is not a candidate for weight loss.    5) exocrine pancreatic insufficiency: If he returns with unintended weight loss, or GI symptoms including diarrhea, he will be reconsidered for Creon  intervention  to assist him with the digestion and absorption of his nutrients.  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  6) Chronic Care/Health Maintenance:  -he  is on ACEI/ARB and Statin medications and  is encouraged to initiate and continue to follow up with Ophthalmology, Dentist,  Podiatrist at least yearly or according to recommendations, and advised to  quit smoking. I have recommended yearly flu vaccine and pneumonia vaccine at least every 5 years; moderate intensity exercise for up to 150 minutes weekly; and  sleep for 7- 9 hours a day.  The patient was counseled on the dangers of tobacco use, and was advised to quit.  Reviewed strategies to maximize success, including removing cigarettes and smoking materials from environment.   - he is  advised to maintain close follow up with Minus Amel, MD for primary care needs, as well as his other providers for optimal and coordinated care.   I spent  26  minutes in the care of the patient today including review of labs from CMP, Lipids, Thyroid  Function, Hematology (current and previous including abstractions from other facilities); face-to-face time discussing  his blood glucose readings/logs, discussing hypoglycemia and hyperglycemia episodes and symptoms, medications doses, his options of short and long term treatment based on the latest standards of care / guidelines;  discussion about incorporating lifestyle medicine;  and documenting the encounter. Risk reduction counseling performed per USPSTF guidelines to reduce  cardiovascular risk factors.     Please refer to Patient Instructions for Blood Glucose Monitoring and Insulin /Medications Dosing Guide"  in media tab for additional information. Please  also refer to " Patient Self Inventory" in the Media  tab for reviewed elements of pertinent patient history.  Kenneth Kelly participated in the discussions, expressed understanding, and voiced agreement with the above plans.  All questions were answered to his satisfaction. he is encouraged  to contact clinic should he have any questions or concerns prior to his return visit.   Follow up plan: - Return in about 3 months (around 09/16/2023) for Bring Meter/CGM Device/Logs- A1c in Office.  Kenneth Orf, MD [facility] [address] [place], [place] [address] Phone: [phone]  Fax: [phone]    06/16/2023, 3:15 PM  This note was partially dictated with voice recognition software. Similar sounding words can be transcribed inadequately or may not  be corrected upon review.

## 2023-06-17 DIAGNOSIS — M1000 Idiopathic gout, unspecified site: Secondary | ICD-10-CM | POA: Diagnosis not present

## 2023-06-17 NOTE — Telephone Encounter (Signed)
 Error

## 2023-06-20 ENCOUNTER — Ambulatory Visit (HOSPITAL_COMMUNITY)
Admission: RE | Admit: 2023-06-20 | Discharge: 2023-06-20 | Disposition: A | Discharge status: Home / Self Care | Source: Ambulatory Visit | Attending: Internal Medicine | Admitting: Internal Medicine

## 2023-06-20 VITALS — BP 126/64 | HR 73 | Ht 60.0 in | Wt 115.8 lb

## 2023-06-20 DIAGNOSIS — I480 Paroxysmal atrial fibrillation: Secondary | ICD-10-CM | POA: Diagnosis not present

## 2023-06-20 DIAGNOSIS — I4891 Unspecified atrial fibrillation: Secondary | ICD-10-CM | POA: Diagnosis not present

## 2023-06-20 DIAGNOSIS — D6869 Other thrombophilia: Secondary | ICD-10-CM

## 2023-06-20 DIAGNOSIS — M79605 Pain in left leg: Secondary | ICD-10-CM | POA: Diagnosis not present

## 2023-06-20 MED ORDER — APIXABAN 5 MG PO TABS: 5.0000 mg | ORAL_TABLET | Freq: Two times a day (BID) | ORAL | 3 refills | Status: DC

## 2023-06-20 MED ORDER — APIXABAN 5 MG PO TABS: 5.0000 mg | ORAL_TABLET | Freq: Two times a day (BID) | ORAL | 3 refills | Status: AC

## 2023-06-20 NOTE — Progress Notes (Signed)
 Primary Care Physician: Minus Amel, MD Primary Cardiologist: Armida Lander, MD Electrophysiologist: Manya Sells, MD     Referring Physician: Device clinic     Kenneth Kelly is a 73 y.o. male with a history of AAA, infrarenal aortic aneurysm, HTN, CAD, s/p CABG x 3 in 2001, carotid artery disease, past history of CVA s/p ILR, T2DM, HLD, tobacco abuse, and atrial fibrillation who presents for consultation in the [facility]. Device clinic alert on 5/12 for episode of Afib with RVR on 5/9 which had duration of 2 minutes. Patient is on ASA and Plavix  with noted plan to continue both until the end of May and then Plavix  alone. Patient has a CHADS2VASC score of 6.  On evaluation today, he is currently in NSR. He is in a facility and is making plans to go back home due to feeling the care is not at standard.   Today, he denies symptoms of palpitations, chest pain, shortness of breath, orthopnea, PND, lower extremity edema, dizziness, presyncope, syncope, snoring, daytime somnolence, bleeding, or neurologic sequela. The patient is tolerating medications without difficulties and is otherwise without complaint today.    he has a BMI of Body mass index is 22.62 kg/m.Aaron Aas Filed Weights   06/20/23 0929  Weight: 52.5 kg    Current Outpatient Medications  Medication Sig Dispense Refill   acetaminophen  (TYLENOL  8 HOUR) 650 MG CR tablet Take 650 mg by mouth as needed for pain.     allopurinol (ZYLOPRIM) 100 MG tablet Take 100 mg by mouth in the morning and at bedtime.     amLODipine  (NORVASC ) 10 MG tablet Take 1 tablet (10 mg total) by mouth daily. 30 tablet 2   atorvastatin  (LIPITOR) 80 MG tablet Take 1 tablet (80 mg total) by mouth daily. 90 tablet 1   Cholecalciferol (VITAMIN D) 50 MCG (2000 UT) CAPS Take 1 capsule by mouth every morning.     clopidogrel  (PLAVIX ) 75 MG tablet Take 75 mg by mouth daily.     glipiZIDE  (GLUCOTROL  XL) 2.5 MG 24 hr tablet Take 1  tablet (2.5 mg total) by mouth daily with breakfast. 90 tablet 1   losartan (COZAAR) 100 MG tablet Take 100 mg by mouth daily.     metFORMIN  (GLUCOPHAGE ) 1000 MG tablet Take 1,000 mg by mouth 2 (two) times daily with a meal.     metoprolol  succinate (TOPROL -XL) 50 MG 24 hr tablet Take 1 tablet (50 mg total) by mouth daily. 30 tablet 2   nitroGLYCERIN  (NITROSTAT ) 0.4 MG SL tablet Place 1 tablet (0.4 mg total) under the tongue every 5 (five) minutes as needed for chest pain. 25 tablet 3   pantoprazole  (PROTONIX ) 40 MG tablet Take 40 mg by mouth daily.     apixaban (ELIQUIS) 5 MG TABS tablet Take 1 tablet (5 mg total) by mouth 2 (two) times daily. 60 tablet 3   No current facility-administered medications for this encounter.    Atrial Fibrillation Management history:  Previous antiarrhythmic drugs: none Previous cardioversions: none Previous ablations: none Anticoagulation history: none   ROS- All systems are reviewed and negative except as per the HPI above.  Physical Exam: BP 126/64   Pulse 73   Ht 5' (1.524 m)   Wt 52.5 kg   BMI 22.62 kg/m   GEN: Well nourished, well developed in no acute distress NECK: No JVD; No carotid bruits CARDIAC: Regular rate and rhythm, no murmurs, rubs, gallops RESPIRATORY:  Clear to auscultation without rales,  wheezing or rhonchi  ABDOMEN: Soft, non-tender, non-distended EXTREMITIES:  No edema; No deformity   EKG today demonstrates  Vent. rate 73 BPM PR interval 172 ms QRS duration 92 ms QT/QTcB 428/471 ms P-R-T axes 48 63 183 Normal sinus rhythm with sinus arrhythmia Cannot rule out Inferior infarct (cited on or before 01-May-2023) Cannot rule out Anterior infarct , age undetermined ST & T wave abnormality, consider lateral ischemia Abnormal ECG When compared with ECG of 01-May-2023 16:25, Sinus rhythm has replaced Ectopic atrial rhythm T wave inversion no longer evident in Inferior leads T wave inversion less evident in Lateral  leads  Echo 07/22/20 demonstrated   1. Left ventricular ejection fraction, by estimation, is 60 to 65%. The  left ventricle has normal function. The left ventricle has no regional  wall motion abnormalities. There is moderate asymmetric left ventricular  hypertrophy of the basal-septal  segment. Left ventricular diastolic parameters are consistent with Grade I  diastolic dysfunction (impaired relaxation).   2. Right ventricular systolic function is mildly reduced. The right  ventricular size is mildly enlarged. Tricuspid regurgitation signal is  inadequate for assessing PA pressure.   3. Left atrial size was moderately dilated.   4. The mitral valve is abnormal. Trivial mitral valve regurgitation.   5. The aortic valve is tricuspid. Aortic valve regurgitation is not  visualized. Mild aortic valve sclerosis is present, with no evidence of  aortic valve stenosis. Aortic valve mean gradient measures 3.5 mmHg.   6. The inferior vena cava is normal in size with <50% respiratory  variability, suggesting right atrial pressure of 8 mmHg.   7. Agitated saline contrast bubble study was negative, with no evidence  of any interatrial shunt.   ASSESSMENT & PLAN CHA2DS2-VASc Score = 6  The patient's score is based upon: CHF History: 0 HTN History: 1 Diabetes History: 1 Stroke History: 2 Vascular Disease History: 1 Age Score: 1 Gender Score: 0       ASSESSMENT AND PLAN: Paroxysmal Atrial Fibrillation (ICD10:  I48.0) The patient's CHA2DS2-VASc score is 6, indicating a 9.7% annual risk of stroke.    He is currently in NSR. Continue Toprol  50 mg daily. Continue conservative observation via subsequent ILR review.   Secondary Hypercoagulable State (ICD10:  D68.69) The patient is at significant risk for stroke/thromboembolism based upon his CHA2DS2-VASc Score of 6.  Start Apixaban (Eliquis).  We discussed risks vs benefits of anticoagulation for stroke prevention related to Afib. After  discussion, patient wishes to begin anticoagulation. Will start Eliquis 5 mg BID with plan to draw CBC in 1 month. Stop ASA. Continue plavix .    Follow up Afib clinic 1 year, sooner if increased burden.   Kenneth Amber, PA-C  Afib Clinic [facility] [address] [place], [place] [address] [phone]

## 2023-06-20 NOTE — Patient Instructions (Signed)
 Stop aspirin   Start Eliquis 5mg  TWICE a day   Have labs drawn at any [facility] in 1 month (order attached)  Follow up Afib Clinic in 1 year

## 2023-06-25 DIAGNOSIS — I6350 Cerebral infarction due to unspecified occlusion or stenosis of unspecified cerebral artery: Secondary | ICD-10-CM | POA: Diagnosis not present

## 2023-06-25 DIAGNOSIS — Z79899 Other long term (current) drug therapy: Secondary | ICD-10-CM | POA: Diagnosis not present

## 2023-06-25 DIAGNOSIS — I10 Essential (primary) hypertension: Secondary | ICD-10-CM | POA: Diagnosis not present

## 2023-06-25 DIAGNOSIS — N390 Urinary tract infection, site not specified: Secondary | ICD-10-CM | POA: Diagnosis not present

## 2023-06-25 DIAGNOSIS — E119 Type 2 diabetes mellitus without complications: Secondary | ICD-10-CM | POA: Diagnosis not present

## 2023-06-25 DIAGNOSIS — F01C0 Vascular dementia, severe, without behavioral disturbance, psychotic disturbance, mood disturbance, and anxiety: Secondary | ICD-10-CM | POA: Diagnosis not present

## 2023-06-25 DIAGNOSIS — M159 Polyosteoarthritis, unspecified: Secondary | ICD-10-CM | POA: Diagnosis not present

## 2023-06-25 DIAGNOSIS — D649 Anemia, unspecified: Secondary | ICD-10-CM | POA: Diagnosis not present

## 2023-06-25 DIAGNOSIS — E785 Hyperlipidemia, unspecified: Secondary | ICD-10-CM | POA: Diagnosis not present

## 2023-06-25 DIAGNOSIS — M109 Gout, unspecified: Secondary | ICD-10-CM | POA: Diagnosis not present

## 2023-06-25 DIAGNOSIS — F17210 Nicotine dependence, cigarettes, uncomplicated: Secondary | ICD-10-CM | POA: Diagnosis not present

## 2023-06-26 DIAGNOSIS — Z79899 Other long term (current) drug therapy: Secondary | ICD-10-CM | POA: Diagnosis not present

## 2023-06-26 DIAGNOSIS — R300 Dysuria: Secondary | ICD-10-CM | POA: Diagnosis not present

## 2023-06-26 DIAGNOSIS — N390 Urinary tract infection, site not specified: Secondary | ICD-10-CM | POA: Diagnosis not present

## 2023-06-30 DIAGNOSIS — N390 Urinary tract infection, site not specified: Secondary | ICD-10-CM | POA: Diagnosis not present

## 2023-06-30 DIAGNOSIS — I714 Abdominal aortic aneurysm, without rupture, unspecified: Secondary | ICD-10-CM | POA: Diagnosis not present

## 2023-06-30 DIAGNOSIS — E785 Hyperlipidemia, unspecified: Secondary | ICD-10-CM | POA: Diagnosis not present

## 2023-06-30 DIAGNOSIS — E119 Type 2 diabetes mellitus without complications: Secondary | ICD-10-CM | POA: Diagnosis not present

## 2023-06-30 DIAGNOSIS — F17210 Nicotine dependence, cigarettes, uncomplicated: Secondary | ICD-10-CM | POA: Diagnosis not present

## 2023-06-30 DIAGNOSIS — Z79899 Other long term (current) drug therapy: Secondary | ICD-10-CM | POA: Diagnosis not present

## 2023-06-30 DIAGNOSIS — I6350 Cerebral infarction due to unspecified occlusion or stenosis of unspecified cerebral artery: Secondary | ICD-10-CM | POA: Diagnosis not present

## 2023-06-30 DIAGNOSIS — R300 Dysuria: Secondary | ICD-10-CM | POA: Diagnosis not present

## 2023-06-30 DIAGNOSIS — K219 Gastro-esophageal reflux disease without esophagitis: Secondary | ICD-10-CM | POA: Diagnosis not present

## 2023-07-02 ENCOUNTER — Ambulatory Visit: Payer: Medicare HMO

## 2023-07-02 DIAGNOSIS — Z8673 Personal history of transient ischemic attack (TIA), and cerebral infarction without residual deficits: Secondary | ICD-10-CM | POA: Diagnosis not present

## 2023-07-02 DIAGNOSIS — M109 Gout, unspecified: Secondary | ICD-10-CM | POA: Diagnosis not present

## 2023-07-02 DIAGNOSIS — F17210 Nicotine dependence, cigarettes, uncomplicated: Secondary | ICD-10-CM | POA: Diagnosis not present

## 2023-07-02 DIAGNOSIS — E1159 Type 2 diabetes mellitus with other circulatory complications: Secondary | ICD-10-CM | POA: Diagnosis not present

## 2023-07-02 DIAGNOSIS — I739 Peripheral vascular disease, unspecified: Secondary | ICD-10-CM | POA: Diagnosis not present

## 2023-07-02 DIAGNOSIS — I10 Essential (primary) hypertension: Secondary | ICD-10-CM | POA: Diagnosis not present

## 2023-07-02 DIAGNOSIS — N390 Urinary tract infection, site not specified: Secondary | ICD-10-CM | POA: Diagnosis not present

## 2023-07-02 DIAGNOSIS — Z6821 Body mass index (BMI) 21.0-21.9, adult: Secondary | ICD-10-CM | POA: Diagnosis not present

## 2023-07-02 DIAGNOSIS — I639 Cerebral infarction, unspecified: Secondary | ICD-10-CM | POA: Diagnosis not present

## 2023-07-03 LAB — CUP PACEART REMOTE DEVICE CHECK
Date Time Interrogation Session: 20250527232139
Implantable Pulse Generator Implant Date: 20220620

## 2023-07-04 ENCOUNTER — Ambulatory Visit: Payer: Self-pay | Admitting: Internal Medicine

## 2023-07-14 NOTE — Addendum Note (Signed)
 Addended by: Edra Govern D on: 07/14/2023 04:10 PM   Modules accepted: Orders

## 2023-07-14 NOTE — Progress Notes (Signed)
 [facility] Summary Report / Loop Recorder

## 2023-07-22 DIAGNOSIS — I4891 Unspecified atrial fibrillation: Secondary | ICD-10-CM | POA: Diagnosis not present

## 2023-07-23 ENCOUNTER — Ambulatory Visit (HOSPITAL_COMMUNITY): Payer: Self-pay | Admitting: Internal Medicine

## 2023-07-23 LAB — CBC
Hematocrit: 27.6 % — ABNORMAL LOW (ref 37.5–51.0)
Hemoglobin: 8.8 g/dL — ABNORMAL LOW (ref 13.0–17.7)
MCH: 31.5 pg (ref 26.6–33.0)
MCHC: 31.9 g/dL (ref 31.5–35.7)
MCV: 99 fL — ABNORMAL HIGH (ref 79–97)
Platelets: 384 10*3/uL (ref 150–450)
RBC: 2.79 x10E6/uL — ABNORMAL LOW (ref 4.14–5.80)
RDW: 13.7 % (ref 11.6–15.4)
WBC: 7 10*3/uL (ref 3.4–10.8)

## 2023-07-28 DIAGNOSIS — E538 Deficiency of other specified B group vitamins: Secondary | ICD-10-CM | POA: Diagnosis not present

## 2023-07-28 DIAGNOSIS — Z6821 Body mass index (BMI) 21.0-21.9, adult: Secondary | ICD-10-CM | POA: Diagnosis not present

## 2023-07-28 DIAGNOSIS — D530 Protein deficiency anemia: Secondary | ICD-10-CM | POA: Diagnosis not present

## 2023-07-31 DIAGNOSIS — H5213 Myopia, bilateral: Secondary | ICD-10-CM | POA: Diagnosis not present

## 2023-08-01 ENCOUNTER — Ambulatory Visit (INDEPENDENT_AMBULATORY_CARE_PROVIDER_SITE_OTHER)

## 2023-08-01 ENCOUNTER — Encounter: Payer: Self-pay | Admitting: Gastroenterology

## 2023-08-01 DIAGNOSIS — I639 Cerebral infarction, unspecified: Secondary | ICD-10-CM

## 2023-08-01 LAB — CUP PACEART REMOTE DEVICE CHECK
Date Time Interrogation Session: 20250626231406
Implantable Pulse Generator Implant Date: 20220620

## 2023-08-04 ENCOUNTER — Ambulatory Visit: Payer: Self-pay | Admitting: Internal Medicine

## 2023-08-06 ENCOUNTER — Other Ambulatory Visit: Payer: Self-pay

## 2023-08-06 ENCOUNTER — Emergency Department (HOSPITAL_COMMUNITY)
Admission: EM | Admit: 2023-08-06 | Discharge: 2023-08-06 | Disposition: A | Discharge status: Home / Self Care | Attending: Emergency Medicine | Admitting: Emergency Medicine

## 2023-08-06 ENCOUNTER — Encounter (HOSPITAL_COMMUNITY): Payer: Self-pay

## 2023-08-06 ENCOUNTER — Emergency Department (HOSPITAL_COMMUNITY)

## 2023-08-06 DIAGNOSIS — Z8673 Personal history of transient ischemic attack (TIA), and cerebral infarction without residual deficits: Secondary | ICD-10-CM | POA: Insufficient documentation

## 2023-08-06 DIAGNOSIS — Z951 Presence of aortocoronary bypass graft: Secondary | ICD-10-CM | POA: Diagnosis not present

## 2023-08-06 DIAGNOSIS — I2510 Atherosclerotic heart disease of native coronary artery without angina pectoris: Secondary | ICD-10-CM | POA: Diagnosis not present

## 2023-08-06 DIAGNOSIS — Z7984 Long term (current) use of oral hypoglycemic drugs: Secondary | ICD-10-CM | POA: Diagnosis not present

## 2023-08-06 DIAGNOSIS — Z7901 Long term (current) use of anticoagulants: Secondary | ICD-10-CM | POA: Diagnosis not present

## 2023-08-06 DIAGNOSIS — F17210 Nicotine dependence, cigarettes, uncomplicated: Secondary | ICD-10-CM | POA: Insufficient documentation

## 2023-08-06 DIAGNOSIS — I10 Essential (primary) hypertension: Secondary | ICD-10-CM | POA: Diagnosis not present

## 2023-08-06 DIAGNOSIS — E119 Type 2 diabetes mellitus without complications: Secondary | ICD-10-CM | POA: Insufficient documentation

## 2023-08-06 DIAGNOSIS — Z8551 Personal history of malignant neoplasm of bladder: Secondary | ICD-10-CM | POA: Insufficient documentation

## 2023-08-06 DIAGNOSIS — Z79899 Other long term (current) drug therapy: Secondary | ICD-10-CM | POA: Diagnosis not present

## 2023-08-06 DIAGNOSIS — M25511 Pain in right shoulder: Secondary | ICD-10-CM | POA: Diagnosis not present

## 2023-08-06 DIAGNOSIS — M19011 Primary osteoarthritis, right shoulder: Secondary | ICD-10-CM | POA: Diagnosis not present

## 2023-08-06 DIAGNOSIS — S4991XA Unspecified injury of right shoulder and upper arm, initial encounter: Secondary | ICD-10-CM | POA: Diagnosis not present
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
## 2023-08-06 NOTE — ED Notes (Addendum)
 Kenneth Kelly, SW with [employer]. APS received report information menitoned earlier note, and will submit report for review. Pt demographics and family member contact information given.
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
## 2023-08-06 NOTE — Discharge Instructions (Signed)
 Please take 1000 mg of tylenol  every 6 hours as needed for pain. You can apply ice to your shoulder as needed for pain. Please follow up with Dr. Onesimo.

## 2023-08-12 DIAGNOSIS — I2510 Atherosclerotic heart disease of native coronary artery without angina pectoris: Secondary | ICD-10-CM | POA: Diagnosis not present

## 2023-08-12 DIAGNOSIS — I25708 Atherosclerosis of coronary artery bypass graft(s), unspecified, with other forms of angina pectoris: Secondary | ICD-10-CM | POA: Diagnosis not present

## 2023-08-12 DIAGNOSIS — E1159 Type 2 diabetes mellitus with other circulatory complications: Secondary | ICD-10-CM | POA: Diagnosis not present

## 2023-08-12 DIAGNOSIS — I739 Peripheral vascular disease, unspecified: Secondary | ICD-10-CM | POA: Diagnosis not present

## 2023-08-12 DIAGNOSIS — F17290 Nicotine dependence, other tobacco product, uncomplicated: Secondary | ICD-10-CM | POA: Diagnosis not present

## 2023-08-12 DIAGNOSIS — I6521 Occlusion and stenosis of right carotid artery: Secondary | ICD-10-CM | POA: Diagnosis not present

## 2023-08-12 DIAGNOSIS — S46919A Strain of unspecified muscle, fascia and tendon at shoulder and upper arm level, unspecified arm, initial encounter: Secondary | ICD-10-CM | POA: Diagnosis not present

## 2023-08-12 DIAGNOSIS — I10 Essential (primary) hypertension: Secondary | ICD-10-CM | POA: Diagnosis not present

## 2023-08-12 DIAGNOSIS — F17210 Nicotine dependence, cigarettes, uncomplicated: Secondary | ICD-10-CM | POA: Diagnosis not present

## 2023-08-12 DIAGNOSIS — Z6821 Body mass index (BMI) 21.0-21.9, adult: Secondary | ICD-10-CM | POA: Diagnosis not present

## 2023-08-20 ENCOUNTER — Telehealth: Payer: Self-pay | Admitting: *Deleted

## 2023-08-20 ENCOUNTER — Ambulatory Visit: Admitting: Gastroenterology

## 2023-08-20 ENCOUNTER — Encounter: Payer: Self-pay | Admitting: Gastroenterology

## 2023-08-20 VITALS — BP 131/68 | HR 76 | Temp 98.2°F | Ht 59.0 in | Wt 119.2 lb

## 2023-08-20 DIAGNOSIS — K625 Hemorrhage of anus and rectum: Secondary | ICD-10-CM | POA: Insufficient documentation

## 2023-08-20 DIAGNOSIS — D530 Protein deficiency anemia: Secondary | ICD-10-CM | POA: Diagnosis not present

## 2023-08-20 DIAGNOSIS — D649 Anemia, unspecified: Secondary | ICD-10-CM | POA: Insufficient documentation

## 2023-08-20 NOTE — Telephone Encounter (Signed)
 Pharmacy please advise on holding Eliquis  for 2 days prior to colonoscopy and EDG scheduled for TBD. Thank you.

## 2023-08-20 NOTE — Patient Instructions (Signed)
 Complete labs today.   If you notice increased fatigue, shortness of breath, lightheadedness please let us  know of go to the emergency room. These would be signs of worsening anemia (low blood).   We will request approval from cardiology/neurology for colonoscopy and upper endoscopy to evaluate your anemia.  Continue pantoprazole  40mg  daily (protects your stomach).

## 2023-08-20 NOTE — Telephone Encounter (Signed)
  Request for patient to stop medication prior to procedure or is needing cleareance  08/20/23  ELRIDGE STEMM 1951-01-21  What type of surgery is being performed? Colonoscopy/Esophagogastroduodenoscopy (EGD)  When is surgery scheduled? TBD  What type of clearance is required (medical or pharmacy to hold medication or both? Both  Patient is needing to have cardiac clearance and clearance to hold Eliquis  x 2 days   Name of physician performing surgery?  Dr.Rourk [facility] at [employer]: [phone] Fax: [phone]  Anethesia type (none, local, MAC, general)? MAC

## 2023-08-20 NOTE — Progress Notes (Unsigned)
 GI Office Note    Referring Provider: Marvine Rush, MD Primary Care Physician:  Marvine Rush, MD  Primary Gastroenterologist: Ozell Hollingshead, MD   Chief Complaint   Chief Complaint  Patient presents with   Anemia    Abnormal labs       History of Present Illness   Kenneth Kelly is a 73 y.o. male presenting today at the request of Dr. Marvine for further evaluation of anemia and colonoscopy.  Stroke at an outside hospital February 2025.   Neuro plans aspirin /Plavix  until May 2016 and Plavix  75 mg daily alone. Noted to have Afib 06/2023, started on Eliquis  at that time.   Labs 6/17: Hgb 8.8 Labs from July 28, 2023: White blood cell count 8600, hemoglobin 8.6, hematocrit 26.8, MCV 101, platelets 445,000, B12 greater than 2000, folate 7.1, ferritin 169, iron 31, TIBC 199, iron sat 16%.  Labs from May 01, 2023: Hemoglobin 14.6  Touch of dementia. Moved from [facility] to [facility]. Couple of weeks ago.  Due for labs going today.   Blood with wiping.  Sometimes stools are black on iron Eating well Weight up four pounds. 130-140 lb No known dysphagia. No nosebleeds.  Dementia 3-4 years Dr. Skeet neurology Dr.   Morna sob. Smoking.   History of etoh use.  More remote     Medications   Current Outpatient Medications  Medication Sig Dispense Refill   acetaminophen  (TYLENOL  8 HOUR) 650 MG CR tablet Take 650 mg by mouth as needed for pain.     allopurinol (ZYLOPRIM) 100 MG tablet Take 100 mg by mouth in the morning and at bedtime.     amLODipine  (NORVASC ) 10 MG tablet Take 1 tablet (10 mg total) by mouth daily. 30 tablet 2   apixaban  (ELIQUIS ) 5 MG TABS tablet Take 1 tablet (5 mg total) by mouth 2 (two) times daily. 60 tablet 3   atorvastatin  (LIPITOR) 80 MG tablet Take 1 tablet (80 mg total) by mouth daily. 90 tablet 1   Cholecalciferol (VITAMIN D) 50 MCG (2000 UT) CAPS Take 1 capsule by mouth every morning.     clopidogrel  (PLAVIX ) 75 MG tablet Take 75 mg by mouth  daily.     glipiZIDE  (GLUCOTROL  XL) 2.5 MG 24 hr tablet Take 1 tablet (2.5 mg total) by mouth daily with breakfast. 90 tablet 1   losartan (COZAAR) 100 MG tablet Take 100 mg by mouth daily.     metFORMIN  (GLUCOPHAGE ) 1000 MG tablet Take 1,000 mg by mouth 2 (two) times daily with a meal.     metoprolol  succinate (TOPROL -XL) 50 MG 24 hr tablet Take 1 tablet (50 mg total) by mouth daily. 30 tablet 2   nitroGLYCERIN  (NITROSTAT ) 0.4 MG SL tablet Place 1 tablet (0.4 mg total) under the tongue every 5 (five) minutes as needed for chest pain. 25 tablet 3   pantoprazole  (PROTONIX ) 40 MG tablet Take 40 mg by mouth daily.     No current facility-administered medications for this visit.    Allergies   Allergies as of 08/20/2023   (No Known Allergies)    Past Medical History   Past Medical History:  Diagnosis Date   A-fib [facility])    Abdominal aortic aneurysm (AAA) (HCC) followed by cardiologist-- dr charls   per last CT 01-12-2016  Infrarenal aortic aneurysm 3.4cm   Arthritis    Hip   Bladder tumor    CAD (coronary artery disease)    CARDIOLOGIST-  DR CHARLS---  HX CABG  X3 08-15-1999   First degree AV block    Gout    History of bladder cancer urologist-  dr watt   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   History of fracture of pelvis    Hyperlipidemia 02/21/2009   Qualifier: Diagnosis of  By: Via LPN, Macario     Hypertension    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   LIMA to LCFx, SVG to RCA, RIMA to LAD   Sinus bradycardia on ECG    Stroke [facility]) 2022   Type 2 diabetes mellitus (HCC)     Past Surgical History   Past Surgical History:  Procedure Laterality Date   CARDIAC CATHETERIZATION  07-20-1999  dr wall   significant 2v CAD, total occlusion LAD an 80% LCFx neither amenable to PCI   CARDIOVASCULAR STRESS TEST  04-23-2016  dr charls ([place])   Low risk nuclear study w/ small, mild intensity, reversible mid to apical inferior defect that is consistent to ischemia  but no diagnotic ST segment changes to indicate ischemia/  normal LV function and wall motion ,  nuclear stress ef 57%   CORONARY ARTERY BYPASS GRAFT  08/15/1999   dr gerhardt [facility]   x3, LIMA to left circumflex, SVG to RCA, and right internal mammary artery graft to the LAD   CYSTOSCOPY WITH BIOPSY N/A 04/08/2017   Procedure: CYSTOSCOPY;  Surgeon: watt Rush, MD;  Location[facility];  Service: Urology;  Laterality: N/A;   CYSTOSCOPY WITH STENT PLACEMENT Right 01/23/2016   Procedure: CYSTOSCOPY WITH STENT PLACEMENT;  Surgeon: Rush watt, MD;  Location: [facility];  Service: Urology;  Laterality: Right;   ENDARTERECTOMY Right 11/13/2021   Procedure: ENDARTERECTOMY CAROTID;  Surgeon: Sheree Penne Bruckner, MD;  Location: [facility] OR;  Service: Vascular;  Laterality: Right;   KNEE ARTHROSCOPY Right 2001  approx.   LOOP RECORDER INSERTION N/A 07/24/2020   Procedure: LOOP RECORDER INSERTION;  Surgeon: Waddell Danelle ORN, MD;  Location: [facility] INVASIVE CV LAB;  Service: Cardiovascular;  Laterality: N/A;   PATCH ANGIOPLASTY Right 11/13/2021   Procedure: PATCH ANGIOPLASTY USING 1CM X 6CM BOVINE PATCH;  Surgeon: Sheree Penne Bruckner, MD;  Location: [facility] OR;  Service: Vascular;  Laterality: Right;   POSTERIOR LAMINECTOMY / DECOMPRESSION LUMBAR SPINE  09-01-2009   [facility]   TRANSURETHRAL RESECTION OF BLADDER TUMOR WITH MITOMYCIN -C Bilateral 01/23/2016   Procedure: CYSTOSCOPY ,TRANSURETHRAL RESECTION OF BLADDER TUMOR;  Surgeon: Rush watt, MD;  Location: [facility];  Service: Urology;  Laterality: Bilateral;    Past Family History   Family History  Problem Relation Age of Onset   Diabetes Mother    Hypertension Father    Heart disease Father    Stroke Father    Heart attack Brother    Heart disease Brother    Heart disease Brother     Past Social History   Social History   Socioeconomic History   Marital status: Divorced    Spouse name: Not on file   Number of children: Not on file   Years  of education: Not on file   Highest education level: Not on file  Occupational History   Not on file  Tobacco Use   Smoking status: Every Day    Current packs/day: 1.00    Average packs/day: 1 pack/day for 56.0 years (56.0 ttl pk-yrs)    Types: Cigarettes   Smokeless tobacco: Never  Vaping Use   Vaping status: Never Used  Substance and Sexual Activity   Alcohol use:  Not Currently    Comment: rare   Drug use: No   Sexual activity: Yes  Other Topics Concern   Not on file  Social History Narrative   Right handed   Drinks caffeine prn   Lives with his brother   One floor home   Social Drivers of Health   Financial Resource Strain: Patient Unable To Answer (04/03/2023)   Received from [facility]   Overall Financial Resource Strain (CARDIA)    Difficulty of Paying Living Expenses: Patient unable to answer  Food Insecurity: No Food Insecurity (09/04/2020)   Hunger Vital Sign    Worried About Running Out of Food in the Last Year: Never true    Ran Out of Food in the Last Year: Never true  Transportation Needs: No Transportation Needs (09/04/2020)   PRAPARE - Administrator, Civil Service (Medical): No    Lack of Transportation (Non-Medical): No  Physical Activity: Not on file  Stress: Not on file  Social Connections: Not on file  Intimate Partner Violence: Not on file    Review of Systems   General: Negative for anorexia, weight loss, fever, chills, fatigue, weakness. Eyes: Negative for vision changes.  ENT: Negative for hoarseness, difficulty swallowing , nasal congestion. CV: Negative for chest pain, angina, palpitations, dyspnea on exertion, peripheral edema.  Respiratory: Negative for dyspnea at rest, dyspnea on exertion, cough, sputum, wheezing.  GI: See history of present illness. GU:  Negative for dysuria, hematuria, urinary incontinence, urinary frequency, nocturnal urination.  MS: Negative for joint pain, low back pain.  Derm: Negative for rash or  itching.  Neuro: Negative for weakness, abnormal sensation, seizure, frequent headaches, memory loss,  confusion.  Psych: Negative for anxiety, depression, suicidal ideation, hallucinations.  Endo: Negative for unusual weight change.  Heme: Negative for bruising or bleeding. Allergy: Negative for rash or hives.  Physical Exam   BP 131/68 (BP Location: Left Arm, Patient Position: Sitting, Cuff Size: Normal)   Pulse 76   Temp 98.2 F (36.8 C) (Temporal)   Ht 4' 11 (1.499 m)   Wt 119 lb 3.2 oz (54.1 kg)   BMI 24.08 kg/m    General: Well-nourished, well-developed in no acute distress.  Head: Normocephalic, atraumatic.   Eyes: Conjunctiva pink, no icterus. Mouth: Oropharyngeal mucosa moist and pink  Neck: Supple without thyromegaly, masses, or lymphadenopathy.  Lungs: Clear to auscultation bilaterally.  Heart: Regular rate and rhythm, no murmurs rubs or gallops.  Abdomen: Bowel sounds are normal, nontender, nondistended, no hepatosplenomegaly or masses,  no abdominal bruits or hernia, no rebound or guarding.   Rectal: not performed Extremities: No lower extremity edema. No clubbing or deformities.  Neuro: Alert and oriented x 4 , grossly normal neurologically.  Skin: Warm and dry, no rash or jaundice.   Psych: Alert and cooperative, normal mood and affect.  Labs   *** Imaging Studies   DG Shoulder Right Result Date: 08/06/2023 CLINICAL DATA:  Right shoulder injury EXAM: RIGHT SHOULDER - 2+ VIEW COMPARISON:  May 01, 2023 FINDINGS: No acute fracture or dislocation. Moderate joint space loss of the AC joint. Soft tissues are unremarkable. Sternotomy wires. Multilevel thoracic osteophytosis. IMPRESSION: 1. No acute fracture or dislocation. 2. Osteoarthritis of the right AC joint. Electronically Signed   By: Rogelia Myers M.D.   On: 08/06/2023 17:13   CUP PACEART REMOTE DEVICE CHECK Result Date: 08/01/2023 ILR summary report received. Battery status OK. Normal device function. No  new symptom, brady, or pause episodes.  8 new AF episodes, longest duration , burden 0.2%, Eliquis  per PA report.  Monthly summary reports and ROV/PRN 2 tachy events, longest duration 9sec, EGM's c/w WCT >20 beats - route to triage for review LA, CVRS   Assessment/Plan:    Patient knows his name and place. He requests we only talk with GD about him, no one else.    ***   Sonny RAMAN. Ezzard, MHS, PA-C [facility]

## 2023-08-21 ENCOUNTER — Telehealth: Payer: Self-pay | Admitting: *Deleted

## 2023-08-21 ENCOUNTER — Telehealth: Payer: Self-pay | Admitting: Gastroenterology

## 2023-08-21 DIAGNOSIS — D649 Anemia, unspecified: Secondary | ICD-10-CM

## 2023-08-21 NOTE — Telephone Encounter (Signed)
 Request for patient to stop medication prior to procedure or is needing cleareance   08/20/23   Kenneth Kelly Oct 14, 1950   What type of surgery is being performed? Colonoscopy/Esophagogastroduodenoscopy (EGD)   When is surgery scheduled? TBD   What type of clearance is required (medical or pharmacy to hold medication or both? medical   Patient is needing to have clearance from neurology prior to being scheduled for procedure.  Name of physician performing surgery?  Dr.Rourk [facility] [employer]: [phone] Fax: [phone]   Anethesia type (none, local, MAC, general)? MAC

## 2023-08-21 NOTE — Telephone Encounter (Signed)
 Please let Dorothyann (granddaughter) know I saw patient labs ordered by PCP from yesterday.  Hgb is 7.9. Hgb was 8.6 on June 23.  Slight decline.   At this time we are waiting on cardiac/neurology clearance.   Monitor for black/bloody stools, increased shortness of breath, increased fatigue or weakness. If any of these, go to the ED.   -->repeat CBC next week.

## 2023-08-22 ENCOUNTER — Other Ambulatory Visit: Payer: Self-pay

## 2023-08-22 DIAGNOSIS — K625 Hemorrhage of anus and rectum: Secondary | ICD-10-CM

## 2023-08-22 NOTE — Progress Notes (Signed)
 [facility] Summary Report / Loop Recorder

## 2023-08-22 NOTE — Addendum Note (Signed)
 Addended by: EZZARD SONNY RAMAN on: 08/22/2023 12:07 PM   Modules accepted: Orders

## 2023-08-22 NOTE — Telephone Encounter (Signed)
 I'll also place for [facility] since they went there last time.

## 2023-08-22 NOTE — Telephone Encounter (Signed)
 The orders have been placed @ [facility] and pt / [facility] has been advised of this. Understanding expressed.

## 2023-08-22 NOTE — Telephone Encounter (Signed)
 Dena, have you placed order for CBC?

## 2023-08-22 NOTE — Telephone Encounter (Signed)
 noted

## 2023-08-22 NOTE — Addendum Note (Signed)
 Addended by: VICCI SELLER A on: 08/22/2023 01:50 PM   Modules accepted: Orders

## 2023-08-24 ENCOUNTER — Emergency Department (HOSPITAL_COMMUNITY)

## 2023-08-24 ENCOUNTER — Encounter (HOSPITAL_COMMUNITY): Payer: Self-pay | Admitting: *Deleted

## 2023-08-24 ENCOUNTER — Emergency Department (HOSPITAL_COMMUNITY)
Admission: EM | Admit: 2023-08-24 | Discharge: 2023-08-24 | Disposition: A | Discharge status: Home / Self Care | Attending: Emergency Medicine | Admitting: Emergency Medicine

## 2023-08-24 ENCOUNTER — Other Ambulatory Visit: Payer: Self-pay

## 2023-08-24 DIAGNOSIS — I4891 Unspecified atrial fibrillation: Secondary | ICD-10-CM | POA: Diagnosis not present

## 2023-08-24 DIAGNOSIS — K4090 Unilateral inguinal hernia, without obstruction or gangrene, not specified as recurrent: Secondary | ICD-10-CM | POA: Diagnosis not present

## 2023-08-24 DIAGNOSIS — I2510 Atherosclerotic heart disease of native coronary artery without angina pectoris: Secondary | ICD-10-CM | POA: Insufficient documentation

## 2023-08-24 DIAGNOSIS — I7143 Infrarenal abdominal aortic aneurysm, without rupture: Secondary | ICD-10-CM | POA: Insufficient documentation

## 2023-08-24 DIAGNOSIS — I10 Essential (primary) hypertension: Secondary | ICD-10-CM | POA: Insufficient documentation

## 2023-08-24 DIAGNOSIS — Z7984 Long term (current) use of oral hypoglycemic drugs: Secondary | ICD-10-CM | POA: Diagnosis not present

## 2023-08-24 DIAGNOSIS — E119 Type 2 diabetes mellitus without complications: Secondary | ICD-10-CM | POA: Diagnosis not present

## 2023-08-24 DIAGNOSIS — D649 Anemia, unspecified: Secondary | ICD-10-CM | POA: Diagnosis not present

## 2023-08-24 DIAGNOSIS — R195 Other fecal abnormalities: Secondary | ICD-10-CM | POA: Insufficient documentation

## 2023-08-24 DIAGNOSIS — K8689 Other specified diseases of pancreas: Secondary | ICD-10-CM | POA: Diagnosis not present

## 2023-08-24 DIAGNOSIS — Z79899 Other long term (current) drug therapy: Secondary | ICD-10-CM | POA: Diagnosis not present

## 2023-08-24 DIAGNOSIS — Z7901 Long term (current) use of anticoagulants: Secondary | ICD-10-CM | POA: Diagnosis not present

## 2023-08-24 LAB — CBC WITH DIFFERENTIAL/PLATELET
Abs Immature Granulocytes: 0.02 K/uL (ref 0.00–0.07)
Basophils Absolute: 0.1 K/uL (ref 0.0–0.1)
Basophils Relative: 1 %
Eosinophils Absolute: 0.1 K/uL (ref 0.0–0.5)
Eosinophils Relative: 2 %
HCT: 27.7 % — ABNORMAL LOW (ref 39.0–52.0)
Hemoglobin: 8.3 g/dL — ABNORMAL LOW (ref 13.0–17.0)
Immature Granulocytes: 0 %
Lymphocytes Relative: 21 %
Lymphs Abs: 1.2 K/uL (ref 0.7–4.0)
MCH: 31 pg (ref 26.0–34.0)
MCHC: 30 g/dL (ref 30.0–36.0)
MCV: 103.4 fL — ABNORMAL HIGH (ref 80.0–100.0)
Monocytes Absolute: 0.4 K/uL (ref 0.1–1.0)
Monocytes Relative: 7 %
Neutro Abs: 4 K/uL (ref 1.7–7.7)
Neutrophils Relative %: 69 %
Platelets: 332 K/uL (ref 150–400)
RBC: 2.68 MIL/uL — ABNORMAL LOW (ref 4.22–5.81)
RDW: 17.2 % — ABNORMAL HIGH (ref 11.5–15.5)
WBC: 5.8 K/uL (ref 4.0–10.5)
nRBC: 0 % (ref 0.0–0.2)

## 2023-08-24 LAB — HEPATIC FUNCTION PANEL
ALT: 25 U/L (ref 0–44)
AST: 36 U/L (ref 15–41)
Albumin: 3.2 g/dL — ABNORMAL LOW (ref 3.5–5.0)
Alkaline Phosphatase: 123 U/L (ref 38–126)
Bilirubin, Direct: <0.1 mg/dL (ref 0.0–0.2)
Total Bilirubin: 0.3 mg/dL (ref 0.0–1.2)
Total Protein: 7.4 g/dL (ref 6.5–8.1)

## 2023-08-24 LAB — PROTIME-INR
INR: 1.1 (ref 0.8–1.2)
Prothrombin Time: 15 s (ref 11.4–15.2)

## 2023-08-24 LAB — BASIC METABOLIC PANEL WITH GFR
Anion gap: 13 (ref 5–15)
BUN: 26 mg/dL — ABNORMAL HIGH (ref 8–23)
CO2: 27 mmol/L (ref 22–32)
Calcium: 9.2 mg/dL (ref 8.9–10.3)
Chloride: 102 mmol/L (ref 98–111)
Creatinine, Ser: 0.85 mg/dL (ref 0.61–1.24)
GFR, Estimated: >60 mL/min (ref >60)
Glucose, Bld: 171 mg/dL — ABNORMAL HIGH (ref 70–99)
Potassium: 3.3 mmol/L — ABNORMAL LOW (ref 3.5–5.1)
Sodium: 142 mmol/L (ref 135–145)

## 2023-08-24 LAB — URINALYSIS, ROUTINE W REFLEX MICROSCOPIC
Bacteria, UA: NONE SEEN
Bilirubin Urine: NEGATIVE
Glucose, UA: NEGATIVE mg/dL
Hgb urine dipstick: NEGATIVE
Ketones, ur: NEGATIVE mg/dL
Leukocytes,Ua: NEGATIVE
Nitrite: NEGATIVE
Protein, ur: 30 mg/dL — AB
Specific Gravity, Urine: 1.016 (ref 1.005–1.030)
pH: 5 (ref 5.0–8.0)

## 2023-08-24 LAB — TYPE AND SCREEN
ABO/RH(D): O POS
Antibody Screen: NEGATIVE

## 2023-08-24 MED ORDER — IOHEXOL 300 MG/ML  SOLN
100.0000 mL | Freq: Once | INTRAMUSCULAR | Status: AC | PRN
  Administered 2023-08-24: 100 mL via INTRAVENOUS

## 2023-08-24 MED ORDER — NICOTINE 21 MG/24HR TD PT24
21.0000 mg | MEDICATED_PATCH | Freq: Once | TRANSDERMAL | Status: DC
  Administered 2023-08-24: 21 mg via TRANSDERMAL
  Filled 2023-08-24: qty 1

## 2023-08-24 NOTE — Discharge Instructions (Signed)
 Someone from your GI's office should be contacting you once you have been approved to have colonoscopy.  You were found to have some changes of your bladder wall on CT today.  I recommend that you call the urology office listed to arrange follow-up regarding that.  Also, you have a aortic aneurysm that will need follow-up with vascular surgery.  I have listed follow-up information for you.  Please call to arrange appointment.  Return to the emergency department for any new or worsening symptoms.

## 2023-08-24 NOTE — ED Triage Notes (Signed)
 Pt with recent low hemoglobin. Pt with c/o generalized weakness. Pt denies any SOB or CP.

## 2023-08-24 NOTE — ED Provider Notes (Signed)
 [place] EMERGENCY DEPARTMENT AT [facility] Provider Note   CSN: [id number] Arrival date & time: 08/24/23  1312     Patient presents with: No chief complaint on file.   Kenneth Kelly is a 73 y.o. male.   HPI     Kenneth Kelly is a 73 y.o. male with past medical history of hypertension, gout, anemia coronary artery disease, type 2 diabetes, atrial fibrillation anticoagulated on apixaban  who presents to the Emergency Department complaining of generalized weakness, fatigue and recent low hemoglobin.  He was seen 4 days ago by GI referred by PCP for anemia and possible colonoscopy.  Patient lives with his granddaughter is is a poor historian.  He denies any abdominal pain, chest pain shortness of breath or dizziness.  No nausea or vomiting.  Hemoglobin mid June was 8.8 he does take iron supplements. Granddaughter noted hemoglobin from a few days ago of 7.3, but I am unable to verify this in epic. Patient states his stools are sometimes dark but he is on iron supplementation.  Denies any bright red blood from his rectum.  Prior to Admission medications   Medication Sig Start Date End Date Taking? Authorizing Provider  acetaminophen  (TYLENOL  8 HOUR) 650 MG CR tablet Take 650 mg by mouth as needed for pain.    [provider]  allopurinol (ZYLOPRIM) 100 MG tablet Take 100 mg by mouth in the morning and at bedtime.    [provider]  amLODipine  (NORVASC ) 10 MG tablet Take 1 tablet (10 mg total) by mouth daily. 05/01/23   Yolande Lamar BROCKS, MD  apixaban  (ELIQUIS ) 5 MG TABS tablet Take 1 tablet (5 mg total) by mouth 2 (two) times daily. 06/20/23   Terra Fairy PARAS, PA-C  atorvastatin  (LIPITOR) 80 MG tablet Take 1 tablet (80 mg total) by mouth daily. 11/27/22   Miriam Norris, NP  Cholecalciferol (VITAMIN D) 50 MCG (2000 UT) CAPS Take 1 capsule by mouth every morning.    [provider]  clopidogrel  (PLAVIX ) 75 MG tablet Take 75 mg by mouth daily. 08/13/21    [provider]  glipiZIDE  (GLUCOTROL  XL) 2.5 MG 24 hr tablet Take 1 tablet (2.5 mg total) by mouth daily with breakfast. 06/16/23   Nida, Gebreselassie W, MD  losartan (COZAAR) 100 MG tablet Take 100 mg by mouth daily. 05/16/23   [provider]  metFORMIN  (GLUCOPHAGE ) 1000 MG tablet Take 1,000 mg by mouth 2 (two) times daily with a meal.    [provider]  metoprolol  succinate (TOPROL -XL) 50 MG 24 hr tablet Take 1 tablet (50 mg total) by mouth daily. 04/18/23   Horton, Charmaine FALCON, MD  nitroGLYCERIN  (NITROSTAT ) 0.4 MG SL tablet Place 1 tablet (0.4 mg total) under the tongue every 5 (five) minutes as needed for chest pain. 02/27/16   Charls Pearla LABOR, MD  pantoprazole  (PROTONIX ) 40 MG tablet Take 40 mg by mouth daily. 06/11/22   [provider]    Allergies: Patient has no known allergies.    Review of Systems  Constitutional:  Positive for fatigue. Negative for chills and fever.  Respiratory:  Negative for shortness of breath.   Cardiovascular:  Negative for chest pain.  Gastrointestinal:  Negative for abdominal distention, abdominal pain, diarrhea, nausea and vomiting.  Musculoskeletal:  Negative for arthralgias and myalgias.  Neurological:  Negative for dizziness, weakness, numbness and headaches.    Updated Vital Signs BP (!) 145/69 (BP Location: Left Arm)   Pulse 68   Temp  97.7 F (36.5 C) (Oral)   Resp (!) 25   Ht 4' 11 (1.499 m)   Wt 54 kg   SpO2 92%   BMI 24.04 kg/m   Physical Exam Vitals and nursing note reviewed. Exam conducted with a chaperone present.  Constitutional:      General: He is not in acute distress.    Appearance: He is not toxic-appearing.     Comments: Frail-appearing  HENT:     Mouth/Throat:     Mouth: Mucous membranes are moist.  Eyes:     General: No scleral icterus.    Pupils: Pupils are equal, round, and reactive to light.  Cardiovascular:     Rate and Rhythm: Normal rate and regular rhythm.  Pulmonary:      Effort: Pulmonary effort is normal.  Abdominal:     General: There is no distension.     Palpations: Abdomen is soft.     Tenderness: There is no abdominal tenderness. There is no guarding.  Genitourinary:    Rectum: Guaiac result positive. No tenderness or external hemorrhoid. Normal anal tone.     Comments: Dark brown to blackish stool, heme positive.  No palpable rectal masses Musculoskeletal:        General: Normal range of motion.     Right lower leg: No edema.     Left lower leg: No edema.  Skin:    General: Skin is warm.     Capillary Refill: Capillary refill takes less than 2 seconds.  Neurological:     General: No focal deficit present.     Mental Status: He is alert.     Sensory: No sensory deficit.     Motor: No weakness.     (all labs ordered are listed, but only abnormal results are displayed) Labs Reviewed  CBC WITH DIFFERENTIAL/PLATELET - Abnormal; Notable for the following components:      Result Value   RBC 2.68 (*)    Hemoglobin 8.3 (*)    HCT 27.7 (*)    MCV 103.4 (*)    RDW 17.2 (*)    All other components within normal limits  BASIC METABOLIC PANEL WITH GFR - Abnormal; Notable for the following components:   Potassium 3.3 (*)    Glucose, Bld 171 (*)    BUN 26 (*)    All other components within normal limits  HEPATIC FUNCTION PANEL - Abnormal; Notable for the following components:   Albumin 3.2 (*)    All other components within normal limits  URINALYSIS, ROUTINE W REFLEX MICROSCOPIC - Abnormal; Notable for the following components:   Protein, ur 30 (*)    All other components within normal limits  PROTIME-INR  POC OCCULT BLOOD, ED  TYPE AND SCREEN    EKG: EKG Interpretation Date/Time:  Sunday August 24 2023 13:57:32 EDT Ventricular Rate:  69 PR Interval:  215 QRS Duration:  88 QT Interval:  448 QTC Calculation: 480 R Axis:   52  Text Interpretation: Sinus rhythm Borderline prolonged PR interval Borderline repolarization abnormality  Borderline prolonged QT interval Confirmed by Cleotilde Rogue ([id number]) on 08/24/2023 2:01:36 PM  Radiology: CT ABDOMEN PELVIS W CONTRAST Result Date: 08/24/2023 CLINICAL DATA:  Acute abdominal pain.  Weakness. EXAM: CT ABDOMEN AND PELVIS WITH CONTRAST TECHNIQUE: Multidetector CT imaging of the abdomen and pelvis was performed using the standard protocol following bolus administration of intravenous contrast. RADIATION DOSE REDUCTION: This exam was performed according to the departmental dose-optimization program which includes automated exposure control, adjustment of  the mA and/or kV according to patient size and/or use of iterative reconstruction technique. CONTRAST:  OMNIPAQUE  IOHEXOL  300 MG/ML  SOLN COMPARISON:  Noncontrast CT 12/17/2018 FINDINGS: Lower chest: Breathing motion artifact. Emphysematous changes at the lung bases. No pleural effusion. Hepatobiliary: No focal liver abnormality is seen. No gallstones, gallbladder wall thickening, or biliary dilatation. Pancreas: Mildly atrophic. No ductal dilatation or inflammation. Faint calcifications in the pancreatic head. Spleen: Normal in size without focal abnormality. Adrenals/Urinary Tract: No adrenal nodule. Slight heterogeneous enhancement of the right kidney with question of urolith Ariel thickening. No hydronephrosis or renal calculi. No left hydronephrosis or inflammation. Asymmetric bladder wall thickening involving the left and posterior bladder wall. Stomach/Bowel: The stomach is distended with ingested material. Fluid level in the stomach. No gastric wall thickening or evidence of gastric outlet obstruction. Small bowel folds appear prominent but no perienteric inflammation, and no abnormal distension to suggest obstruction. Moderate volume of colonic stool. The appendix is not confidently seen. Vascular/Lymphatic: Extensive aortic atherosclerosis. Infrarenal aortic aneurysm maximal dimension 4.7 cm. Aneurysmal dilatation extends to the iliac  bifurcation. 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 No obvious bulky adenopathy, paucity of intra-abdominal fat and lack of enteric contrast limits detailed assessment. Reproductive: Prostate is prominent. Other: Right inguinal hernia contains fat. There is mild edema of the herniated fat, however mild generalized edema of the subcutaneous tissues is seen. No abdominal ascites or free air. Musculoskeletal: Scoliosis and degenerative change in the spine. Multilevel Modic endplate changes and vacuum phenomenon. There are no acute or suspicious osseous abnormalities. IMPRESSION: 1. Slight heterogeneous enhancement of the right kidney with question of urothelial thickening. Asymmetric bladder wall thickening involving the left and posterior bladder wall. Findings are suspicious for urinary tract infection. Recommend correlation with urinalysis. 2. Right inguinal hernia contains fat. There is mild edema of the herniated fat, however mild generalized edema of the subcutaneous tissues is also seen. 3. Infrarenal aortic aneurysm measuring 4.7 cm. Recommend follow-up every 6 months and vascular consultation. This recommendation follows ACR consensus guidelines: White Paper of the ACR Incidental Findings Committee II on Vascular Findings. J Am Coll Radiol 2013; 10:789-794. Aortic Atherosclerosis (ICD10-I70.0) and Emphysema (ICD10-J43.9). Electronically Signed   By: Andrea Gasman M.D.   On: 08/24/2023 17:29     Procedures      Medications Ordered in the ED - No data to display                                  Medical Decision Making Patient here from home accompanied by his granddaughter who provides most the history.  Concern for generalized weakness and fatigue.  Patient has history of anemia and he is anticoagulated with apixaban  secondary to atrial fibrillation.  He has been seen by local GI for his anemia.  Colonoscopy is planned but he is awaiting approval from cardiology and neurology.  There was  concern for blood in his stool and possible need for transfusion.  Patient denies any abdominal pain chest pain or shortness of breath.  On my exam patient is frail appearing but nontoxic.  His abdominal exam is reassuring.  DRE was performed by me and he has darkish brown heme positive stool I do not appreciate any palpable rectal masses.  Differential includes but not limited to acute GI bleed, bleeding secondary to neoplasm, worsening of his anemia  Amount and/or Complexity of Data Reviewed Labs: ordered.    Details: Labs no evidence  of leukocytosis, hemoglobin today of 8.3.  He was noted to have a hemoglobin of 8.8 back in June of this year.  LFTs are reassuring.  Urinalysis without evidence of infection or hematuria.  Mild hypokalemia Radiology: ordered.    Details: CT of the abdomen and pelvis was ordered for further evaluation.  Shows asymmetric bladder wall thickening of the left and posterior bladder wall.  CT findings concerning for urinary tract infection but urinalysis does not support this.  He has infrarenal aortic aneurysm measuring 4.7. ECG/medicine tests: ordered.    Details: EKG shows sinus rhythm borderline PR interval and borderline prolonged QT Discussion of management or test interpretation with external provider(s): Discussed findings with GI, Dr. Eartha.  Felt that patient is stable for discharge home given no significant decrease in his hemoglobin since mid June.  GI is awaiting approval for colonoscopy from neurology and cardiology.  GI to try to expedite this.   I also spoke with Dr. 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  Will follow-up up in office  I have discussed ED findings with patient and granddaughter at bedside.  He will need follow-up with urology and follow-up with vascular surgery for further evaluation of infrarenal aneurysm.  Return precautions discussed   Risk OTC drugs. Prescription drug management.        Final  diagnoses:  Anemia, unspecified type  Guaiac positive stools  Infrarenal abdominal aortic aneurysm (AAA) without rupture [facility])    ED Discharge Orders     None          Herlinda Madelin RIGGERS 08/24/23 1936    Cleotilde Rogue, MD 08/26/23 [phone]

## 2023-08-25 ENCOUNTER — Telehealth: Payer: Self-pay | Admitting: Neurology

## 2023-08-25 NOTE — Telephone Encounter (Signed)
 Received surgical clearance from [facility]. Paperwork is in Dr. Jayme box

## 2023-08-26 ENCOUNTER — Telehealth: Payer: Self-pay | Admitting: Gastroenterology

## 2023-08-26 LAB — URINE CULTURE: Culture: NO GROWTH

## 2023-08-26 NOTE — Telephone Encounter (Signed)
 Neurology made a note as of 7/21, request in Dr. Parley box to address and resent clearance again to cardiology pool.

## 2023-08-26 NOTE — Telephone Encounter (Deleted)
 Primary Cardiologist:Branch, Dorn, MD   Preoperative team, please contact this patient and set up a phone call appointment for further preoperative risk assessment. Please obtain consent and complete medication review. Thank you for your help.   I confirm that guidance regarding antiplatelet and oral anticoagulation therapy has been completed and, if necessary, noted below.     I also confirmed the patient resides in the state of [place] . As per [facility] telemedicine laws, the patient must reside in the state in which the provider is licensed.   Kenneth Rosaline HERO, NP  08/26/2023, 4:01 PM

## 2023-08-26 NOTE — Telephone Encounter (Signed)
 LMTCB for catherine to make aware

## 2023-08-26 NOTE — Telephone Encounter (Signed)
 Patient seen in ED 7/20 with worsening fatigue. Stool dark brown/blackish and heme positive. Hgb 8.3, overall stable.   Dr. Eartha was contacted by ED provider to see if  egd and colonoscopy could be expedited. We have been waiting for Neurology and cardiology clearance.  Mindy, will you please reach out to cardiology and neurology and request they weigh in regarding clearance as patient needs expedited EGD/colonoscopy.   He had stroke 03/2023. Currently on Plavix  along.  Dx with Afib 06/2023, started Eliquis  due to significant stroke risk.

## 2023-08-26 NOTE — Telephone Encounter (Signed)
 Patient's granddaughter called to let us  know that he was seen in the ED over the weekend and he did have blood in his stool.  I told her that I would pass the information along, She said that she was told to inform us  that he had been seen in the ED.

## 2023-08-26 NOTE — Telephone Encounter (Signed)
 Someone please let granddaughter know we are aware of recent ED visit. We have reached out to both cardiology and neurology again today trying to expedite clearance so we can move forward with procedures.

## 2023-08-27 ENCOUNTER — Other Ambulatory Visit: Payer: Self-pay | Admitting: *Deleted

## 2023-08-27 ENCOUNTER — Encounter: Payer: Self-pay | Admitting: *Deleted

## 2023-08-27 MED ORDER — PEG 3350-KCL-NA BICARB-NACL 420 G PO SOLR: 4000.0000 mL | Freq: Once | ORAL | 0 refills | Status: AC

## 2023-08-27 NOTE — Telephone Encounter (Signed)
 Patient with diagnosis of afib on Eliquis  for anticoagulation.    Procedure: Colonoscopy/Esophagogastroduodenoscopy (EGD)  Date of procedure: TBD   CHA2DS2-VASc Score = 6   This indicates a 9.7% annual risk of stroke. The patient's score is based upon: CHF History: 0 HTN History: 1 Diabetes History: 1 Stroke History: 2 Vascular Disease History: 1 Age Score: 1 Gender Score: 0      Patient w/ stroke in 2022 and Feb 2025.  Patient had been non-compliant with his medications and his blood pressure was uncontrolled.  He had a loop recorder placed after his stroke in 2022, no afib was detected until Afib 06/16/23. He was started on Eliquis  06/20/23.   CrCl 58 ml/min Platelet count 332  Patient has not had an Afib/aflutter ablation within the last 3 months or DCCV within the last 30 days  Patient's last stroke now >3 months out. Given the fact that he had a loop recorder placed and no afib was detected until after his second stroke makes me think his second stroke was not caused by afib. Will confirm with MD that it is ok to hold Eliquis  for 2 days prior to procedure.   **This guidance is not considered finalized until pre-operative APP has relayed final recommendations.**

## 2023-08-27 NOTE — Telephone Encounter (Signed)
 See other telephone note dated 08/20/23, I have given orders to schedule. Cleared by both neuro and cardiology. Ok to hold eliquis  48 hours. Will continue plavix .

## 2023-08-27 NOTE — Telephone Encounter (Signed)
 [facility] PA:  [id number] Prior authorization is not required for this code. EGD: Approved Authorization #[id number]  Tracking #[id number] Dates of service 09/24/2023 - 12/24/2023

## 2023-08-27 NOTE — Telephone Encounter (Signed)
   Patient Name: Kenneth Kelly  DOB: 1950-09-01 MRN: [id number]  Primary Cardiologist: Alvan Carrier, MD  Chart reviewed as part of pre-operative protocol coverage. Given past medical history and time since last visit, based on ACC/AHA guidelines, Kenneth Kelly is at acceptable risk for the planned procedure without further cardiovascular testing.   Per Dr. Alvan 08/27/2023 Ok to hold eliquis  for procedure (X 2 days).  The patient was advised that if he develops new symptoms prior to surgery to contact our office to arrange for a follow-up visit, and he verbalized understanding.  I will route this recommendation to the requesting party via Epic fax function and remove from pre-op pool.  Please call with questions.  Lamarr Satterfield, NP 08/27/2023, 9:53 AM

## 2023-08-27 NOTE — Telephone Encounter (Signed)
 Pt has been scheduled for 09/24/23. Instructions mailed and prep sent to pharmacy.

## 2023-08-27 NOTE — Telephone Encounter (Signed)
 Anyway do get him done sooner?  I would suggest rechecking his CBC in one week. He can have done at [facility] lab or [facility]. Orders are in for both.

## 2023-08-27 NOTE — Telephone Encounter (Signed)
 Pt has been scheduled for 09/24/23. Instruction mailed and prep sent to pharmacy

## 2023-08-27 NOTE — Telephone Encounter (Signed)
 Ok to schedule. Hold eliquis  48 hours.Do NOT hold Plavix .  Please schedule EGD/colonoscopy per previous orders provided. ASA 3, room 3

## 2023-08-28 ENCOUNTER — Encounter: Payer: Self-pay | Admitting: *Deleted

## 2023-08-28 NOTE — Telephone Encounter (Signed)
 Pt has been rescheduled for 09/08/23. Comer will come by office to pick up instructions and message about having blood work done in a week.

## 2023-08-28 NOTE — Telephone Encounter (Signed)
 LMOVM for granddaughter Comer to return call

## 2023-08-28 NOTE — Telephone Encounter (Signed)
 Thank you :)

## 2023-09-01 ENCOUNTER — Ambulatory Visit (INDEPENDENT_AMBULATORY_CARE_PROVIDER_SITE_OTHER)

## 2023-09-01 ENCOUNTER — Telehealth: Payer: Self-pay

## 2023-09-01 DIAGNOSIS — I639 Cerebral infarction, unspecified: Secondary | ICD-10-CM | POA: Diagnosis not present

## 2023-09-01 DIAGNOSIS — D649 Anemia, unspecified: Secondary | ICD-10-CM

## 2023-09-01 LAB — CUP PACEART REMOTE DEVICE CHECK
Date Time Interrogation Session: 20250727231016
Implantable Pulse Generator Implant Date: 20220620

## 2023-09-02 ENCOUNTER — Telehealth: Payer: Self-pay

## 2023-09-02 NOTE — Patient Instructions (Signed)
 Kenneth Kelly  09/02/2023     @PREFPERIOPPHARMACY @   Your procedure is scheduled on 09/08/2023.   Report to Kenneth Kelly at  0730  A.M.   Call this number if you have problems the morning of surgery:  [phone]  If you experience any cold or flu symptoms such as cough, fever, chills, shortness of breath, etc. between now and your scheduled surgery, please notify us  at the above number.   Remember:        Your last dose of eliquis  should be on 09/05/2023.        DO NOT take any medications for diabetes the morning of your procedure.   Follow the diet and prep instructions given to you by the office.   You may drink clear liquids until 0530 am on 09/08/2023.    Clear liquids allowed are:                    Water , Juice (No red color; non-citric and without pulp; diabetics please choose diet or no sugar options), Carbonated beverages (diabetics please choose diet or no sugar options), Clear Tea (No creamer, milk, or cream, including half & half and powdered creamer), Black Coffee Only (No creamer, milk or cream, including half & half and powdered creamer), and Clear Sports drink (No red color; diabetics please choose diet or no sugar options)    Take these medicines the morning of surgery with A SIP OF WATER                         allopurinol, amlodipine , metoprolol , pantoprazole .    Do not wear jewelry, make-up or nail polish, including gel polish,  artificial nails, or any other type of covering on natural nails (fingers and  toes).  Do not wear lotions, powders, or perfumes, or deodorant.  Do not shave 48 hours prior to surgery.  Men may shave face and neck.  Do not bring valuables to the hospital.  [facility] is not responsible for any belongings or valuables.  Contacts, dentures or bridgework may not be worn into surgery.  Leave your suitcase in the car.  After surgery it may be brought to your room.  For patients admitted to the hospital, discharge time will be  determined by your treatment team.  Patients discharged the day of surgery will not be allowed to drive home and must have someone with them for 24 hours.    Special instructions:   DO NOT smoke tobacco or vape for 24 hours before your procedure.  Please read over the following fact sheets that you were given. Anesthesia Post-op Instructions and Care and Recovery After Surgery      Upper Endoscopy, Adult, Care After After the procedure, it is common to have a sore throat. It is also common to have: Mild stomach pain or discomfort. Bloating. Nausea. Follow these instructions at home: The instructions below may help you care for yourself at home. Your health care provider may give you more instructions. If you have questions, ask your health care provider. If you were given a sedative during the procedure, it can affect you for several hours. Do not drive or operate machinery until your health care provider says that it is safe. If you will be going home right after the procedure, plan to have a responsible adult: Take you home from the hospital or clinic. You will not be allowed to drive. Care  for you for the time you are told. Follow instructions from your health care provider about what you may eat and drink. Return to your normal activities as told by your health care provider. Ask your health care provider what activities are safe for you. Take over-the-counter and prescription medicines only as told by your health care provider. Contact a health care provider if you: Have a sore throat that lasts longer than one day. Have trouble swallowing. Have a fever. Get help right away if you: Vomit blood or your vomit looks like coffee grounds. Have bloody, black, or tarry stools. Have a very bad sore throat or you cannot swallow. Have difficulty breathing or very bad pain in your chest or abdomen. These symptoms may be an emergency. Get help right away. Call 911. Do not wait to see if  the symptoms will go away. Do not drive yourself to the hospital. Summary After the procedure, it is common to have a sore throat, mild stomach discomfort, bloating, and nausea. If you were given a sedative during the procedure, it can affect you for several hours. Do not drive until your health care provider says that it is safe. Follow instructions from your health care provider about what you may eat and drink. Return to your normal activities as told by your health care provider. This information is not intended to replace advice given to you by your health care provider. Make sure you discuss any questions you have with your health care provider. Document Revised: 05/02/2021 Document Reviewed: 05/02/2021 Elsevier Patient Education  2024 [employer]Colonoscopy, Adult, Care After The following information offers guidance on how to care for yourself after your procedure. Your health care provider may also give you more specific instructions. If you have problems or questions, contact your health care provider. What can I expect after the procedure? After the procedure, it is common to have: A small amount of blood in your stool for 24 hours after the procedure. Some gas. Mild cramping or bloating of your abdomen. Follow these instructions at home: Eating and drinking  Drink enough fluid to keep your urine pale yellow. Follow instructions from your health care provider about eating or drinking restrictions. Resume your normal diet as told by your health care provider. Avoid heavy or fried foods that are hard to digest. Activity Rest as told by your health care provider. Avoid sitting for a long time without moving. Get up to take short walks every 1-2 hours. This is important to improve blood flow and breathing. Ask for help if you feel weak or unsteady. Return to your normal activities as told by your health care provider. Ask your health care provider what activities are safe for  you. Managing cramping and bloating  Try walking around when you have cramps or feel bloated. If directed, apply heat to your abdomen as told by your health care provider. Use the heat source that your health care provider recommends, such as a moist heat pack or a heating pad. Place a towel between your skin and the heat source. Leave the heat on for 20-30 minutes. Remove the heat if your skin turns bright red. This is especially important if you are unable to feel pain, heat, or cold. You have a greater risk of getting burned. General instructions If you were given a sedative during the procedure, it can affect you for several hours. Do not drive or operate machinery until your health care provider says that it is safe. For the first  24 hours after the procedure: Do not sign important documents. Do not drink alcohol. Do your regular daily activities at a slower pace than normal. Eat soft foods that are easy to digest. Take over-the-counter and prescription medicines only as told by your health care provider. Keep all follow-up visits. This is important. Contact a health care provider if: You have blood in your stool 2-3 days after the procedure. Get help right away if: You have more than a small spotting of blood in your stool. You have large blood clots in your stool. You have swelling of your abdomen. You have nausea or vomiting. You have a fever. You have increasing pain in your abdomen that is not relieved with medicine. These symptoms may be an emergency. Get help right away. Call 911. Do not wait to see if the symptoms will go away. Do not drive yourself to the hospital. Summary After the procedure, it is common to have a small amount of blood in your stool. You may also have mild cramping and bloating of your abdomen. If you were given a sedative during the procedure, it can affect you for several hours. Do not drive or operate machinery until your health care provider says  that it is safe. Get help right away if you have a lot of blood in your stool, nausea or vomiting, a fever, or increased pain in your abdomen. This information is not intended to replace advice given to you by your health care provider. Make sure you discuss any questions you have with your health care provider. Document Revised: 03/05/2022 Document Reviewed: 09/13/2020 Elsevier Patient Education  2024 [employer].General Anesthesia, Adult, Care After The following information offers guidance on how to care for yourself after your procedure. Your health care provider may also give you more specific instructions. If you have problems or questions, contact your health care provider. What can I expect after the procedure? After the procedure, it is common for people to: Have pain or discomfort at the IV site. Have nausea or vomiting. Have a sore throat or hoarseness. Have trouble concentrating. Feel cold or chills. Feel weak, sleepy, or tired (fatigue). Have soreness and body aches. These can affect parts of the body that were not involved in surgery. Follow these instructions at home: For the time period you were told by your health care provider:  Rest. Do not participate in activities where you could fall or become injured. Do not drive or use machinery. Do not drink alcohol. Do not take sleeping pills or medicines that cause drowsiness. Do not make important decisions or sign legal documents. Do not take care of children on your own. General instructions Drink enough fluid to keep your urine pale yellow. If you have sleep apnea, surgery and certain medicines can increase your risk for breathing problems. Follow instructions from your health care provider about wearing your sleep device: Anytime you are sleeping, including during daytime naps. While taking prescription pain medicines, sleeping medicines, or medicines that make you drowsy. Return to your normal activities as told by  your health care provider. Ask your health care provider what activities are safe for you. Take over-the-counter and prescription medicines only as told by your health care provider. Do not use any products that contain nicotine  or tobacco. These products include cigarettes, chewing tobacco, and vaping devices, such as e-cigarettes. These can delay incision healing after surgery. If you need help quitting, ask your health care provider. Contact a health care provider if: You have nausea  or vomiting that does not get better with medicine. You vomit every time you eat or drink. You have pain that does not get better with medicine. You cannot urinate or have bloody urine. You develop a skin rash. You have a fever. Get help right away if: You have trouble breathing. You have chest pain. You vomit blood. These symptoms may be an emergency. Get help right away. Call 911. Do not wait to see if the symptoms will go away. Do not drive yourself to the hospital. Summary After the procedure, it is common to have a sore throat, hoarseness, nausea, vomiting, or to feel weak, sleepy, or fatigue. For the time period you were told by your health care provider, do not drive or use machinery. Get help right away if you have difficulty breathing, have chest pain, or vomit blood. These symptoms may be an emergency. This information is not intended to replace advice given to you by your health care provider. Make sure you discuss any questions you have with your health care provider. Document Revised: 04/20/2021 Document Reviewed: 04/20/2021 Elsevier Patient Education  2024 [employer].

## 2023-09-02 NOTE — Progress Notes (Signed)
 Complex Care Management Note  Care Guide Note 09/02/2023 Name: Kenneth Kelly MRN: [id number] DOB: 08-10-1950  [name] is a 73 y.o. year old male who sees Marvine Rush, MD for primary care. I reached out to Nancyann KANDICE Rung by phone today to offer complex care management services.  [name] was given information about Complex Care Management services today including:   The Complex Care Management services include support from the care team which includes your Nurse Care Manager, Clinical Social Worker, or Pharmacist.  The Complex Care Management team is here to help remove barriers to the health concerns and goals most important to you. Complex Care Management services are voluntary, and the patient may decline or stop services at any time by request to their care team member.   Complex Care Management Consent Status: Patient agreed to services and verbal consent obtained.   Follow up plan:  Telephone appointment with complex care management team member scheduled for:  09/10/23 @ 11 AM  Encounter Outcome:  Patient Scheduled  Leotis Rase [facility], [facility] Guide  Direct Dial: [phone]  Fax [phone]

## 2023-09-04 ENCOUNTER — Encounter (HOSPITAL_COMMUNITY)
Admission: RE | Admit: 2023-09-04 | Discharge: 2023-09-04 | Disposition: A | Discharge status: Home / Self Care | Source: Ambulatory Visit | Attending: Internal Medicine | Admitting: Internal Medicine

## 2023-09-04 VITALS — BP 140/68 | HR 68 | Resp 18 | Ht 59.0 in | Wt 119.0 lb

## 2023-09-04 DIAGNOSIS — Z01812 Encounter for preprocedural laboratory examination: Secondary | ICD-10-CM | POA: Insufficient documentation

## 2023-09-04 DIAGNOSIS — E1159 Type 2 diabetes mellitus with other circulatory complications: Secondary | ICD-10-CM | POA: Insufficient documentation

## 2023-09-04 DIAGNOSIS — D649 Anemia, unspecified: Secondary | ICD-10-CM | POA: Insufficient documentation

## 2023-09-04 LAB — CBC WITH DIFFERENTIAL/PLATELET
Abs Immature Granulocytes: 0.02 K/uL (ref 0.00–0.07)
Basophils Absolute: 0.1 K/uL (ref 0.0–0.1)
Basophils Relative: 1 %
Eosinophils Absolute: 0.2 K/uL (ref 0.0–0.5)
Eosinophils Relative: 4 %
HCT: 29.9 % — ABNORMAL LOW (ref 39.0–52.0)
Hemoglobin: 9.3 g/dL — ABNORMAL LOW (ref 13.0–17.0)
Immature Granulocytes: 0 %
Lymphocytes Relative: 27 %
Lymphs Abs: 1.3 K/uL (ref 0.7–4.0)
MCH: 31 pg (ref 26.0–34.0)
MCHC: 31.1 g/dL (ref 30.0–36.0)
MCV: 99.7 fL (ref 80.0–100.0)
Monocytes Absolute: 0.4 K/uL (ref 0.1–1.0)
Monocytes Relative: 8 %
Neutro Abs: 3 K/uL (ref 1.7–7.7)
Neutrophils Relative %: 60 %
Platelets: 323 K/uL (ref 150–400)
RBC: 3 MIL/uL — ABNORMAL LOW (ref 4.22–5.81)
RDW: 16.9 % — ABNORMAL HIGH (ref 11.5–15.5)
WBC: 5 K/uL (ref 4.0–10.5)
nRBC: 0 % (ref 0.0–0.2)

## 2023-09-04 LAB — BASIC METABOLIC PANEL WITH GFR
Anion gap: 13 (ref 5–15)
BUN: 19 mg/dL (ref 8–23)
CO2: 23 mmol/L (ref 22–32)
Calcium: 8.8 mg/dL — ABNORMAL LOW (ref 8.9–10.3)
Chloride: 102 mmol/L (ref 98–111)
Creatinine, Ser: 0.94 mg/dL (ref 0.61–1.24)
GFR, Estimated: >60 mL/min (ref >60)
Glucose, Bld: 144 mg/dL — ABNORMAL HIGH (ref 70–99)
Potassium: 3.7 mmol/L (ref 3.5–5.1)
Sodium: 138 mmol/L (ref 135–145)

## 2023-09-05 ENCOUNTER — Ambulatory Visit: Payer: Self-pay | Admitting: Internal Medicine

## 2023-09-08 ENCOUNTER — Encounter (HOSPITAL_COMMUNITY): Payer: Self-pay | Admitting: Internal Medicine

## 2023-09-08 ENCOUNTER — Encounter (HOSPITAL_COMMUNITY)
Admission: RE | Disposition: A | Discharge status: Home / Self Care | Payer: Self-pay | Source: Home / Self Care | Attending: Internal Medicine

## 2023-09-08 ENCOUNTER — Ambulatory Visit (HOSPITAL_COMMUNITY)
Admission: RE | Admit: 2023-09-08 | Discharge: 2023-09-08 | Disposition: A | Discharge status: Home / Self Care | Attending: Internal Medicine | Admitting: Internal Medicine

## 2023-09-08 ENCOUNTER — Ambulatory Visit (HOSPITAL_BASED_OUTPATIENT_CLINIC_OR_DEPARTMENT_OTHER)

## 2023-09-08 ENCOUNTER — Ambulatory Visit (HOSPITAL_COMMUNITY)

## 2023-09-08 DIAGNOSIS — K921 Melena: Secondary | ICD-10-CM

## 2023-09-08 DIAGNOSIS — E119 Type 2 diabetes mellitus without complications: Secondary | ICD-10-CM | POA: Diagnosis not present

## 2023-09-08 DIAGNOSIS — Z8249 Family history of ischemic heart disease and other diseases of the circulatory system: Secondary | ICD-10-CM | POA: Insufficient documentation

## 2023-09-08 DIAGNOSIS — M1990 Unspecified osteoarthritis, unspecified site: Secondary | ICD-10-CM | POA: Diagnosis not present

## 2023-09-08 DIAGNOSIS — D649 Anemia, unspecified: Secondary | ICD-10-CM | POA: Insufficient documentation

## 2023-09-08 DIAGNOSIS — I4891 Unspecified atrial fibrillation: Secondary | ICD-10-CM | POA: Insufficient documentation

## 2023-09-08 DIAGNOSIS — F17210 Nicotine dependence, cigarettes, uncomplicated: Secondary | ICD-10-CM | POA: Diagnosis not present

## 2023-09-08 DIAGNOSIS — I2510 Atherosclerotic heart disease of native coronary artery without angina pectoris: Secondary | ICD-10-CM | POA: Diagnosis not present

## 2023-09-08 DIAGNOSIS — Z7984 Long term (current) use of oral hypoglycemic drugs: Secondary | ICD-10-CM | POA: Diagnosis not present

## 2023-09-08 DIAGNOSIS — Z833 Family history of diabetes mellitus: Secondary | ICD-10-CM | POA: Insufficient documentation

## 2023-09-08 DIAGNOSIS — Z7901 Long term (current) use of anticoagulants: Secondary | ICD-10-CM | POA: Insufficient documentation

## 2023-09-08 DIAGNOSIS — Z8673 Personal history of transient ischemic attack (TIA), and cerebral infarction without residual deficits: Secondary | ICD-10-CM | POA: Diagnosis not present

## 2023-09-08 DIAGNOSIS — Z7902 Long term (current) use of antithrombotics/antiplatelets: Secondary | ICD-10-CM | POA: Diagnosis not present

## 2023-09-08 DIAGNOSIS — I10 Essential (primary) hypertension: Secondary | ICD-10-CM

## 2023-09-08 HISTORY — PX: COLONOSCOPY: SHX5424

## 2023-09-08 HISTORY — PX: ESOPHAGOGASTRODUODENOSCOPY: SHX5428

## 2023-09-08 LAB — GLUCOSE, CAPILLARY: Glucose-Capillary: 117 mg/dL — ABNORMAL HIGH (ref 70–99)

## 2023-09-08 SURGERY — COLONOSCOPY
Anesthesia: Monitor Anesthesia Care

## 2023-09-08 MED ORDER — PROPOFOL 10 MG/ML IV BOLUS
INTRAVENOUS | Status: DC | PRN
  Administered 2023-09-08: 50 mg via INTRAVENOUS

## 2023-09-08 MED ORDER — PROPOFOL 500 MG/50ML IV EMUL
INTRAVENOUS | Status: DC | PRN
Start: 2023-09-08 — End: 2023-09-08
  Administered 2023-09-08: 150 ug/kg/min via INTRAVENOUS

## 2023-09-08 MED ORDER — EPHEDRINE SULFATE-NACL 50-0.9 MG/10ML-% IV SOSY
PREFILLED_SYRINGE | INTRAVENOUS | Status: DC | PRN
Start: 2023-09-08 — End: 2023-09-08
  Administered 2023-09-08: 10 mg via INTRAVENOUS

## 2023-09-08 MED ORDER — LIDOCAINE 2% (20 MG/ML) 5 ML SYRINGE
INTRAMUSCULAR | Status: DC | PRN
  Administered 2023-09-08: 60 mg via INTRAVENOUS

## 2023-09-08 MED ORDER — LACTATED RINGERS IV SOLN: INTRAVENOUS | Status: DC

## 2023-09-08 NOTE — Transfer of Care (Signed)
 Immediate Anesthesia Transfer of Care Note  Patient: Kenneth Kelly  Procedure(s) Performed: COLONOSCOPY EGD (ESOPHAGOGASTRODUODENOSCOPY)  Patient Location: Short Stay  Anesthesia Type:MAC  Level of Consciousness: awake, alert , oriented, and patient cooperative  Airway & Oxygen Therapy: Patient Spontanous Breathing  Post-op Assessment: Report given to RN, Post -op Vital signs reviewed and stable, and Patient moving all extremities X 4  Post vital signs: Reviewed and stable  Last Vitals:  Vitals Value Taken Time  BP 104/76 11:12  Temp 36.4 C 09/08/23 11:12  Pulse 50 09/08/23 11:12  Resp 15 09/08/23 11:12  SpO2 100 % 09/08/23 11:12    Last Pain:  Vitals:   09/08/23 1112  TempSrc: Axillary  PainSc:          Complications: No notable events documented.

## 2023-09-08 NOTE — Anesthesia Preprocedure Evaluation (Addendum)
 Anesthesia Evaluation  Patient identified by MRN, date of birth, ID band Patient confused  General Assessment Comment:Pt poor historian Most of history obtained from daughter  Reviewed: Allergy & Precautions, H&P , NPO status , Patient's Chart, lab work & pertinent test results  Airway Mallampati: II  TM Distance: >3 FB Neck ROM: Full    Dental no notable dental hx.    Pulmonary shortness of breath, Current Smoker and Patient abstained from smoking.   Pulmonary exam normal breath sounds clear to auscultation       Cardiovascular hypertension, + CAD  + dysrhythmias Atrial Fibrillation (-) pacemaker Rhythm:Regular Rate:Normal  Loop recorder   Neuro/Psych S/p CEA Pt had additional CVA 4/25 Based on significance of anemia/blood in stool and patients significant weakness procedure considered emergent CVA  negative psych ROS   GI/Hepatic negative GI ROS, Neg liver ROS,,,  Endo/Other  diabetes    Renal/GU negative Renal ROS  negative genitourinary   Musculoskeletal negative musculoskeletal ROS (+) Arthritis ,    Abdominal   Peds negative pediatric ROS (+)  Hematology negative hematology ROS (+) Blood dyscrasia, anemia   Anesthesia Other Findings   Reproductive/Obstetrics negative OB ROS                              Anesthesia Physical Anesthesia Plan  ASA: 4 and emergent  Anesthesia Plan: MAC   Post-op Pain Management:    Induction: Intravenous  PONV Risk Score and Plan: Treatment may vary due to age or medical condition  Airway Management Planned: Nasal Cannula  Additional Equipment:   Intra-op Plan:   Post-operative Plan:   Informed Consent: I have reviewed the patients History and Physical, chart, labs and discussed the procedure including the risks, benefits and alternatives for the proposed anesthesia with the patient or authorized representative who has indicated his/her  understanding and acceptance.     Dental advisory given  Plan Discussed with: CRNA  Anesthesia Plan Comments:          Anesthesia Quick Evaluation

## 2023-09-08 NOTE — Anesthesia Postprocedure Evaluation (Signed)
 Anesthesia Post Note  Patient: Kenneth Kelly  Procedure(s) Performed: COLONOSCOPY EGD (ESOPHAGOGASTRODUODENOSCOPY)  Patient location during evaluation: PACU Anesthesia Type: MAC Level of consciousness: awake and alert Pain management: pain level controlled Vital Signs Assessment: post-procedure vital signs reviewed and stable Respiratory status: spontaneous breathing, nonlabored ventilation, respiratory function stable and patient connected to nasal cannula oxygen Cardiovascular status: stable and blood pressure returned to baseline Postop Assessment: no apparent nausea or vomiting Anesthetic complications: no   No notable events documented.   Last Vitals:  Vitals:   09/08/23 0842 09/08/23 1112  BP: (!) 174/78   Pulse: (!) 49 (!) 50  Resp: 16 15  Temp: 36.6 C 36.4 C  SpO2: 98% 100%    Last Pain:  Vitals:   09/08/23 1120  TempSrc:   PainSc: 0-No pain                 Andrea Limes

## 2023-09-08 NOTE — H&P (Signed)
 @LOGO @   Primary Care Physician:  Marvine Rush, MD Primary Gastroenterologist:  Dr. Shaaron  Pre-Procedure History & Physical: HPI:  Kenneth Kelly is a 73 y.o. male here for  further evaluation of melena and rectal bleeding via EGD and colonoscopy today.  Patient with multiple Co. morbidities.  History of CVA.  Atrial fibrillation on  Eliquis  and Plavix .  Both cardiology and neurology have okayed coming off of Eliquis  x 2 days prior to the procedure.  He remains on Plavix .  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   LIMA to LCFx, SVG to RCA, RIMA to LAD   Sinus bradycardia on ECG    Stroke (HCC) 2022   Type 2 diabetes mellitus (HCC)     Past Surgical History:  Procedure Laterality Date   CARDIAC CATHETERIZATION  07-20-1999  dr wall   significant 2v CAD, total occlusion LAD an 80% LCFx neither amenable to PCI   CARDIOVASCULAR STRESS TEST  04-23-2016  dr Kenneth ()   Low risk nuclear study w/ small, mild intensity, reversible mid to apical inferior defect that is consistent to ischemia but no diagnotic ST segment changes to indicate ischemia/  normal LV function and wall motion ,  nuclear stress ef 57%   CORONARY ARTERY BYPASS GRAFT  08/15/1999   dr gerhardt [facility]   x3, LIMA to left circumflex, SVG to RCA, and right internal mammary artery graft to the LAD   CYSTOSCOPY WITH BIOPSY N/A 04/08/2017   Procedure:  CYSTOSCOPY;  Surgeon: Kelly Rush, MD;  Location: [facility];  Service: Urology;  Laterality: N/A;   CYSTOSCOPY WITH STENT PLACEMENT Right 01/23/2016   Procedure: CYSTOSCOPY WITH STENT PLACEMENT;  Surgeon: Kenneth watt, MD;  Location: [facility];  Service: Urology;  Laterality: Right;   ENDARTERECTOMY Right 11/13/2021   Procedure: ENDARTERECTOMY CAROTID;  Surgeon: Sheree Penne Bruckner, MD;  Location: [facility] OR;  Service: Vascular;  Laterality: Right;   KNEE ARTHROSCOPY Right 2001  approx.   LOOP RECORDER INSERTION N/A 07/24/2020   Procedure: LOOP RECORDER INSERTION;  Surgeon: Waddell Danelle ORN, MD;  Location: [facility] INVASIVE CV LAB;  Service: Cardiovascular;  Laterality: N/A;   PATCH ANGIOPLASTY Right 11/13/2021   Procedure: PATCH ANGIOPLASTY USING 1CM X 6CM BOVINE PATCH;  Surgeon: Sheree Penne Bruckner, MD;  [facility];  Service: Vascular;  Laterality: Right;   POSTERIOR LAMINECTOMY / DECOMPRESSION LUMBAR SPINE  09-01-2009   [facility]   TRANSURETHRAL RESECTION OF BLADDER TUMOR WITH MITOMYCIN -C Bilateral 01/23/2016   Procedure: CYSTOSCOPY ,TRANSURETHRAL RESECTION OF BLADDER TUMOR;  Surgeon: Kenneth watt, MD;  Location: [facility];  Service: Urology;  Laterality: Bilateral;    Prior to Admission medications   Medication Sig Start Date End Date Taking? Authorizing Provider  acetaminophen  (TYLENOL  8 HOUR) 650 MG CR tablet  Take 650 mg by mouth as needed for pain.   Yes [provider]  allopurinol (ZYLOPRIM) 100 MG tablet Take 100 mg by mouth in the morning and at bedtime.   Yes [provider]  amLODipine  (NORVASC ) 10 MG tablet Take 1 tablet (10 mg total) by mouth daily. 05/01/23  Yes Yolande Lamar BROCKS, MD  atorvastatin  (LIPITOR) 80 MG tablet Take 1 tablet (80 mg total) by mouth daily. 11/27/22  Yes Miriam Norris, NP  Cholecalciferol (VITAMIN D) 50 MCG (2000 UT) CAPS Take 1 capsule by mouth every morning.   Yes [provider]  clopidogrel  (PLAVIX ) 75 MG tablet Take  75 mg by mouth daily. 08/13/21  Yes [provider]  glipiZIDE  (GLUCOTROL  XL) 2.5 MG 24 hr tablet Take 1 tablet (2.5 mg total) by mouth daily with breakfast. 06/16/23  Yes Nida, Gebreselassie W, MD  losartan (COZAAR) 100 MG tablet Take 100 mg by mouth daily. 05/16/23  Yes [provider]  metFORMIN  (GLUCOPHAGE ) 1000 MG tablet Take 1,000 mg by mouth 2 (two) times daily with a meal.   Yes [provider]  metoprolol  succinate (TOPROL -XL) 50 MG 24 hr tablet Take 1 tablet (50 mg total) by mouth daily. 04/18/23  Yes Horton, Charmaine FALCON, MD  pantoprazole  (PROTONIX ) 40 MG tablet Take 40 mg by mouth daily. 06/11/22  Yes [provider]  apixaban  (ELIQUIS ) 5 MG TABS tablet Take 1 tablet (5 mg total) by mouth 2 (two) times daily. 06/20/23   Terra Fairy PARAS, PA-C  nitroGLYCERIN  (NITROSTAT ) 0.4 MG SL tablet Place 1 tablet (0.4 mg total) under the tongue every 5 (five) minutes as needed for chest pain. 02/27/16   Kenneth Pearla LABOR, MD    Allergies as of 08/27/2023   (No Known Allergies)    Family History  Problem Relation Age of Onset   Diabetes Mother    Hypertension Father    Heart disease Father    Stroke Father    Heart attack Brother    Heart disease Brother    Heart disease Brother     Social History   Socioeconomic History   Marital status: Divorced    Spouse name: Not on file   Number of children: Not on file   Years of education: Not on file   Highest education level: Not on file  Occupational History   Not on file  Tobacco Use   Smoking status: Every Day    Current packs/day: 1.00    Average packs/day: 1 pack/day for 56.0 years (56.0 ttl pk-yrs)    Types: Cigarettes   Smokeless tobacco: Never  Vaping Use   Vaping status: Never Used  Substance and Sexual Activity   Alcohol use: Not Currently    Comment: rare   Drug use: No   Sexual activity: Yes  Other Topics Concern   Not on file  Social History Narrative   Right handed   Drinks caffeine  prn   Lives with his brother   One floor home   Social Drivers of Health   Financial Resource Strain: Patient Unable To Answer (04/03/2023)   Received from [facility]   Overall Financial Resource Strain (CARDIA)    Difficulty of Paying Living Expenses: Patient unable to answer  Food Insecurity: No Food Insecurity (09/04/2020)   Hunger Vital Sign    Worried About Running Out of Food in the Last Year: Never true    Ran Out of Food in the Last Year: Never true  Transportation Needs:  No Transportation Needs (09/04/2020)   PRAPARE - Administrator, Civil Service (Medical): No    Lack of Transportation (Non-Medical): No  Physical Activity: Not on file  Stress: Not on file  Social Connections: Not on file  Intimate Partner Violence: Not on file    Review of Systems: See HPI, otherwise negative ROS  Physical Exam: BP (!) 174/78   Pulse (!) 49   Temp 97.9 F (36.6 C) (Oral)   Resp 16   SpO2 98%  Mouth:  No deformity or lesions. Neck:  Supple; no masses or thyromegaly. No significant cervical adenopathy. Lungs:  Clear throughout to auscultation.   No wheezes, crackles, or rhonchi. No acute distress. Heart:  Regular rate and rhythm; no murmurs, clicks, rubs,  or gallops. Abdomen: Non-distended, normal bowel sounds.  Soft and nontender without appreciable mass or hepatosplenomegaly.   Impression/Plan:    73 year old gentleman with multiple comorbidities including CVA A-fib on Eliquis  and Plavix  here for EGD and colonoscopy for rectal bleeding and melena..  Here for EGD and colonoscopy.  Cardiology neurology signed off on holding Eliquis  for 2 days prior to procedure.  He will continue Plavix  per plan.  Patient denies dysphagia.  The risks, benefits, limitations, imponderables and alternatives regarding both EGD and colonoscopy have been reviewed with the patient. Questions have been answered. All parties agreeable.       Notice: This dictation was prepared with Dragon  dictation along with smaller phrase technology. Any transcriptional errors that result from this process are unintentional and may not be corrected upon review.

## 2023-09-08 NOTE — Discharge Instructions (Addendum)
 EGD Discharge instructions Please read the instructions outlined below and refer to this sheet in the next few weeks. These discharge instructions provide you with general information on caring for yourself after you leave the hospital. Your doctor may also give you specific instructions. While your treatment has been planned according to the most current medical practices available, unavoidable complications occasionally occur. If you have any problems or questions after discharge, please call your doctor. ACTIVITY You may resume your regular activity but move at a slower pace for the next 24 hours.  Take frequent rest periods for the next 24 hours.  Walking will help expel (get rid of) the air and reduce the bloated feeling in your abdomen.  No driving for 24 hours (because of the anesthesia (medicine) used during the test).  You may shower.  Do not sign any important legal documents or operate any machinery for 24 hours (because of the anesthesia used during the test).  NUTRITION Drink plenty of fluids.  You may resume your normal diet.  Begin with a light meal and progress to your normal diet.  Avoid alcoholic beverages for 24 hours or as instructed by your caregiver.  MEDICATIONS You may resume your normal medications unless your caregiver tells you otherwise.  WHAT YOU CAN EXPECT TODAY You may experience abdominal discomfort such as a feeling of fullness or "gas" pains.  FOLLOW-UP Your doctor will discuss the results of your test with you.  SEEK IMMEDIATE MEDICAL ATTENTION IF ANY OF THE FOLLOWING OCCUR: Excessive nausea (feeling sick to your stomach) and/or vomiting.  Severe abdominal pain and distention (swelling).  Trouble swallowing.  Temperature over 101 F (37.8 C).  Rectal bleeding or vomiting of blood.     Colonoscopy Discharge Instructions  Read the instructions outlined below and refer to this sheet in the next few weeks. These discharge instructions provide you with  general information on caring for yourself after you leave the hospital. Your doctor may also give you specific instructions. While your treatment has been planned according to the most current medical practices available, unavoidable complications occasionally occur. If you have any problems or questions after discharge, call Dr. Shaaron at [phone]. ACTIVITY You may resume your regular activity, but move at a slower pace for the next 24 hours.  Take frequent rest periods for the next 24 hours.  Walking will help get rid of the air and reduce the bloated feeling in your belly (abdomen).  No driving for 24 hours (because of the medicine (anesthesia) used during the test).   Do not sign any important legal documents or operate any machinery for 24 hours (because of the anesthesia used during the test).  NUTRITION Drink plenty of fluids.  You may resume your normal diet as instructed by your doctor.  Begin with a light meal and progress to your normal diet. Heavy or fried foods are harder to digest and may make you feel sick to your stomach (nauseated).  Avoid alcoholic beverages for 24 hours or as instructed.  MEDICATIONS You may resume your normal medications unless your doctor tells you otherwise.  WHAT YOU CAN EXPECT TODAY Some feelings of bloating in the abdomen.  Passage of more gas than usual.  Spotting of blood in your stool or on the toilet paper.  IF YOU HAD POLYPS REMOVED DURING THE COLONOSCOPY: No aspirin  products for 7 days or as instructed.  No alcohol for 7 days or as instructed.  Eat a soft diet for the next 24 hours.  FINDING OUT THE RESULTS OF YOUR TEST Not all test results are available during your visit. If your test results are not back during the visit, make an appointment with your caregiver to find out the results. Do not assume everything is normal if you have not heard from your caregiver or the medical facility. It is important for you to follow up on all of your test  results.  SEEK IMMEDIATE MEDICAL ATTENTION IF: You have more than a spotting of blood in your stool.  Your belly is swollen (abdominal distention).  You are nauseated or vomiting.  You have a temperature over 101.  You have abdominal pain or discomfort that is severe or gets worse throughout the day.       Your upper GI tract appeared normal today.  Likewise, your colonoscopy was normal.   resume Eliquis  today.    To complete evaluation of your GI tract as to the cause of bleeding, we will schedule a capsule study in the near future.    CBC and office visit with Sonny Kerns in 3 weeks    At patient request, I called Dorothyann Hurst at [phone]-reviewed findings and recommendations

## 2023-09-08 NOTE — Op Note (Signed)
 [facility] Patient Name: Kenneth Kelly Procedure Date: 09/08/2023 10:08 AM MRN: [id number] Date of Birth: 09-22-1950 Attending MD: Lamar Ozell Hollingshead , MD, [id number] CSN: [id number] Age: 73 Admit Type: Outpatient Procedure:                Upper GI endoscopy Indications:              Hematochezia Providers:                Lamar Ozell Hollingshead, MD, Devere Lodge, Jon Loge Referring MD:              Medicines:                Propofol  per Anesthesia Complications:            No immediate complications. Estimated Blood Loss:     Estimated blood loss: none. Procedure:                Pre-Anesthesia Assessment:                           - Prior to the procedure, a History and Physical                            was performed, and patient medications and                            allergies were reviewed. The patient's tolerance of                            previous anesthesia was also reviewed. The risks                            and benefits of the procedure and the sedation                            options and risks were discussed with the patient.                            All questions were answered, and informed consent                            was obtained. Prior Anticoagulants: The patient has                            taken no anticoagulant or antiplatelet agents. ASA                            Grade Assessment: III - A patient with severe                            systemic disease. After reviewing the risks and  benefits, the patient was deemed in satisfactory                            condition to undergo the procedure.                           After obtaining informed consent, the endoscope was                            passed under direct vision. Throughout the                            procedure, the patient's blood pressure, pulse, and                            oxygen saturations were monitored  continuously. The                            [id number]-[id number] ([id number]) scope was introduced through the                            mouth, and advanced to the second part of duodenum.                            The upper GI endoscopy was accomplished without                            difficulty. The patient tolerated the procedure                            well. Scope In: 10:40:28 AM Scope Out: 10:43:48 AM Total Procedure Duration: 0 hours 3 minutes 20 seconds  Findings:      The examined esophagus was normal.      The entire examined stomach was normal.      The duodenal bulb and second portion of the duodenum were normal. Impression:               - Normal esophagus.                           - Normal stomach.                           - Normal duodenal bulb and second portion of the                            duodenum.                           - No specimens collected. Moderate Sedation:      Moderate (conscious) sedation was personally administered by an       anesthesia professional. The following parameters were monitored: oxygen       saturation, heart rate, blood pressure, respiratory rate, EKG, adequacy       of pulmonary ventilation, and response to care. Recommendation:           -  Patient has a contact number available for                            emergencies. The signs and symptoms of potential                            delayed complications were discussed with the                            patient. Return to normal activities tomorrow.                            Written discharge instructions were provided to the                            patient.                           - Advance diet as tolerated.                           - Continue present medications. See colonoscopy                            report.                           - Return to my office (date not yet determined). Procedure Code(s):        --- Professional ---                           [phone],  Esophagogastroduodenoscopy, flexible,                            transoral; diagnostic, including collection of                            specimen(s) by brushing or washing, when performed                            (separate procedure) Diagnosis Code(s):        --- Professional ---                           K92.1, Melena (includes Hematochezia) CPT copyright 2022 American Medical Association. All rights reserved. The codes documented in this report are preliminary and upon coder review may  be revised to meet current compliance requirements. Lamar HERO. Mardell Suttles, MD Lamar Ozell Hollingshead, MD 09/08/2023 10:47:28 AM This report has been signed electronically. Number of Addenda: 0

## 2023-09-08 NOTE — Op Note (Signed)
 [facility] Patient Name: Kenneth Kelly Procedure Date: 09/08/2023 10:08 AM MRN: [id number] Date of Birth: 26-Mar-1950 Attending MD: Lamar Ozell Hollingshead , MD, [id number] CSN: [id number] Age: 73 Admit Type: Outpatient Procedure:                Colonoscopy Indications:              Hematochezia Providers:                Lamar Ozell Hollingshead, MD, Devere Lodge, Jon Loge Referring MD:              Medicines:                Propofol  per Anesthesia Complications:            No immediate complications. Estimated Blood Loss:     Estimated blood loss: none. Procedure:                Pre-Anesthesia Assessment:                           - Prior to the procedure, a History and Physical                            was performed, and patient medications and                            allergies were reviewed. The patient's tolerance of                            previous anesthesia was also reviewed. The risks                            and benefits of the procedure and the sedation                            options and risks were discussed with the patient.                            All questions were answered, and informed consent                            was obtained. Prior Anticoagulants: The patient                            last took Eliquis  (apixaban ) 2 days prior to the                            procedure. ASA Grade Assessment: III - A patient                            with severe systemic disease. After reviewing the                            risks  and benefits, the patient was deemed in                            satisfactory condition to undergo the procedure.                           After obtaining informed consent, the colonoscope                            was passed under direct vision. Throughout the                            procedure, the patient's blood pressure, pulse, and                            oxygen saturations were monitored  continuously. The                            [id number]) scope was introduced through                            the anus and advanced to the the cecum, identified                            by appendiceal orifice and ileocecal valve. The                            colonoscopy was performed without difficulty. The                            patient tolerated the procedure well. The quality                            of the bowel preparation was adequate. The                            ileocecal valve, appendiceal orifice, and rectum                            were photographed. Scope In: 10:51:05 AM Scope Out: 11:06:18 AM Scope Withdrawal Time: 0 hours 8 minutes 16 seconds  Total Procedure Duration: 0 hours 15 minutes 13 seconds  Findings:      The perianal and digital rectal examinations were normal.      The colon (entire examined portion) appeared normal. Acute angulation       precluded intubating the terminal ileum.      The retroflexed view of the distal rectum and anal verge was normal and       showed no anal or rectal abnormalities. Impression:               - The entire examined colon is normal.                           - The distal rectum and anal verge are normal on  retroflexion view.                           - No specimens collected.                           - Patient's upper and lower GI tract looked really                            good today. Given clinical presentation, it is                            recommended that he undergo a capsule study as an                            outpatient to complete GI evaluation. Moderate Sedation:      Moderate (conscious) sedation was personally administered by an       anesthesia professional. The following parameters were monitored: oxygen       saturation, heart rate, blood pressure, respiratory rate, EKG, adequacy       of pulmonary ventilation, and response to care. Recommendation:            - Patient has a contact number available for                            emergencies. The signs and symptoms of potential                            delayed complications were discussed with the                            patient. Return to normal activities tomorrow.                            Written discharge instructions were provided to the                            patient.                           - Advance diet as tolerated.                           - Continue present medications.                           - No repeat colonoscopy.                           - Return to GI clinic in 3 weeks. Procedure Code(s):        --- Professional ---                           [phone], Colonoscopy, flexible; diagnostic, including  collection of specimen(s) by brushing or washing,                            when performed (separate procedure) Diagnosis Code(s):        --- Professional ---                           K92.1, Melena (includes Hematochezia) CPT copyright 2022 American Medical Association. All rights reserved. The codes documented in this report are preliminary and upon coder review may  be revised to meet current compliance requirements. Lamar HERO. Japneet Staggs, MD Lamar Ozell Hollingshead, MD 09/08/2023 11:22:11 AM This report has been signed electronically. Number of Addenda: 0

## 2023-09-09 ENCOUNTER — Encounter (HOSPITAL_COMMUNITY): Payer: Self-pay | Admitting: Internal Medicine

## 2023-09-09 ENCOUNTER — Other Ambulatory Visit: Payer: Self-pay

## 2023-09-09 DIAGNOSIS — I739 Peripheral vascular disease, unspecified: Secondary | ICD-10-CM

## 2023-09-10 ENCOUNTER — Other Ambulatory Visit: Payer: Self-pay

## 2023-09-10 NOTE — Patient Instructions (Signed)
 Visit Information  Thank you for taking time to visit with me today. Please don't hesitate to contact me if I can be of assistance to you before our next scheduled appointment.  Our next appointment is by telephone on 10/08/2023 at 11:00 am Please call the care guide team at [phone] if you need to cancel or reschedule your appointment.   Patient has declined VBCI services at this time.    Please call 911 if you are experiencing a Mental Health or Behavioral Health Crisis or need someone to talk to.  Patient verbalizes understanding of instructions and care plan provided today and agrees to view in MyChart. Active MyChart status and patient understanding of how to access instructions and care plan via MyChart confirmed with patient.     Olam Ally, MSW, LCSW   [facility], [facility] Licensed Clinical Social Worker Direct Dial: [phone]

## 2023-09-10 NOTE — Patient Outreach (Signed)
 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 LCSW discussed the reason for referral and granddaughter explained that at the time the patient was in a bad living situation. Since that time, he has been living with her doing much better. Patient reports that he is doing fine now. They declined enrollment in VBCI services. LCSW scheduled a follow up for 10/08/2023 to check back in and provided them with LCSW contact information if a need arises.  Olam Ally, MSW, LCSW [place]  [facility], [facility] Licensed Clinical Social Worker Direct Dial: [phone]

## 2023-09-16 ENCOUNTER — Ambulatory Visit: Admitting: "Endocrinology

## 2023-09-19 ENCOUNTER — Other Ambulatory Visit (HOSPITAL_COMMUNITY)

## 2023-09-26 ENCOUNTER — Ambulatory Visit: Admitting: "Endocrinology

## 2023-10-01 DIAGNOSIS — E782 Mixed hyperlipidemia: Secondary | ICD-10-CM | POA: Diagnosis not present

## 2023-10-01 DIAGNOSIS — I739 Peripheral vascular disease, unspecified: Secondary | ICD-10-CM | POA: Diagnosis not present

## 2023-10-01 DIAGNOSIS — F17210 Nicotine dependence, cigarettes, uncomplicated: Secondary | ICD-10-CM | POA: Diagnosis not present

## 2023-10-01 DIAGNOSIS — E7849 Other hyperlipidemia: Secondary | ICD-10-CM | POA: Diagnosis not present

## 2023-10-01 DIAGNOSIS — Z6821 Body mass index (BMI) 21.0-21.9, adult: Secondary | ICD-10-CM | POA: Diagnosis not present

## 2023-10-01 DIAGNOSIS — E1159 Type 2 diabetes mellitus with other circulatory complications: Secondary | ICD-10-CM | POA: Diagnosis not present

## 2023-10-01 DIAGNOSIS — E559 Vitamin D deficiency, unspecified: Secondary | ICD-10-CM | POA: Diagnosis not present

## 2023-10-01 DIAGNOSIS — I2510 Atherosclerotic heart disease of native coronary artery without angina pectoris: Secondary | ICD-10-CM | POA: Diagnosis not present

## 2023-10-01 DIAGNOSIS — Z125 Encounter for screening for malignant neoplasm of prostate: Secondary | ICD-10-CM | POA: Diagnosis not present

## 2023-10-01 DIAGNOSIS — D530 Protein deficiency anemia: Secondary | ICD-10-CM | POA: Diagnosis not present

## 2023-10-01 DIAGNOSIS — I25708 Atherosclerosis of coronary artery bypass graft(s), unspecified, with other forms of angina pectoris: Secondary | ICD-10-CM | POA: Diagnosis not present

## 2023-10-01 DIAGNOSIS — I10 Essential (primary) hypertension: Secondary | ICD-10-CM | POA: Diagnosis not present

## 2023-10-01 LAB — HEMOGLOBIN A1C: Hemoglobin A1C: 5.5

## 2023-10-02 ENCOUNTER — Encounter: Payer: Self-pay | Admitting: "Endocrinology

## 2023-10-02 ENCOUNTER — Ambulatory Visit: Admitting: "Endocrinology

## 2023-10-02 ENCOUNTER — Ambulatory Visit

## 2023-10-02 VITALS — BP 138/74 | HR 56 | Ht 59.0 in | Wt 118.6 lb

## 2023-10-02 DIAGNOSIS — I639 Cerebral infarction, unspecified: Secondary | ICD-10-CM

## 2023-10-02 DIAGNOSIS — Z7984 Long term (current) use of oral hypoglycemic drugs: Secondary | ICD-10-CM | POA: Diagnosis not present

## 2023-10-02 DIAGNOSIS — I10 Essential (primary) hypertension: Secondary | ICD-10-CM | POA: Diagnosis not present

## 2023-10-02 DIAGNOSIS — E782 Mixed hyperlipidemia: Secondary | ICD-10-CM

## 2023-10-02 DIAGNOSIS — K8681 Exocrine pancreatic insufficiency: Secondary | ICD-10-CM

## 2023-10-02 DIAGNOSIS — E1159 Type 2 diabetes mellitus with other circulatory complications: Secondary | ICD-10-CM | POA: Diagnosis not present

## 2023-10-02 LAB — CUP PACEART REMOTE DEVICE CHECK
Date Time Interrogation Session: 20250827231113
Implantable Pulse Generator Implant Date: 20220620

## 2023-10-02 NOTE — Progress Notes (Signed)
 10/02/2023, 2:25 PM   Endocrinology follow-up note  Subjective:    Patient ID: Kenneth Kelly, male    DOB: September 25, 1950.  Kenneth Kelly is being seen in follow-up after he was seen in consultation for management of currently uncontrolled symptomatic diabetes requested by  Marvine Rush, MD.   Past Medical History:  Diagnosis Date   A-fib [facility])    Abdominal aortic aneurysm (AAA) [facility]) followed by cardiologist-- dr charls   per last CT 01-12-2016  Infrarenal aortic aneurysm 3.4cm   Arthritis    Hip   Bladder tumor    CAD (coronary artery disease)    CARDIOLOGIST-  DR CHARLS---  HX CABG X3 08-15-1999   First degree AV block    Gout    History of bladder cancer urologist-  dr watt   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   History of fracture of pelvis    Hyperlipidemia 02/21/2009   Qualifier: Diagnosis of  By: Via LPN, Lynn     Hypertension    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   LIMA to LCFx, SVG to RCA, RIMA to LAD   Sinus bradycardia on ECG    Stroke (HCC) 2022   Type 2 diabetes mellitus (HCC)     Past Surgical History:  Procedure Laterality Date   CARDIAC CATHETERIZATION  07-20-1999  dr wall   significant 2v CAD, total occlusion LAD an 80% LCFx neither amenable to PCI   CARDIOVASCULAR STRESS TEST  04-23-2016  dr charls ([place])   Low risk nuclear study w/ small, mild intensity, reversible mid to apical inferior defect that is consistent to ischemia but no diagnotic ST segment changes to indicate ischemia/  normal LV function and wall motion ,  nuclear stress ef 57%   COLONOSCOPY N/A 09/08/2023   Procedure: COLONOSCOPY;  Surgeon: Shaaron Lamar HERO, MD;  [facility];  Service: Endoscopy;  Laterality: N/A;  7:30 am, asa 3   CORONARY ARTERY BYPASS GRAFT  08/15/1999   dr gerhardt [facility]   x3, LIMA to left circumflex, SVG to RCA, and right internal mammary artery graft to the LAD   CYSTOSCOPY WITH BIOPSY N/A 04/08/2017    Procedure: CYSTOSCOPY;  Surgeon: watt Rush, MD;  Location: [facility];  Service: Urology;  Laterality: N/A;   CYSTOSCOPY WITH STENT PLACEMENT Right 01/23/2016   Procedure: CYSTOSCOPY WITH STENT PLACEMENT;  Surgeon: Rush watt, MD;  Location: [facility];  Service: Urology;  Laterality: Right;   ENDARTERECTOMY Right 11/13/2021   Procedure: ENDARTERECTOMY CAROTID;  Surgeon: Sheree Penne Bruckner, MD;  Location: [facility] OR;  Service: Vascular;  Laterality: Right;   ESOPHAGOGASTRODUODENOSCOPY N/A 09/08/2023   Procedure: EGD (ESOPHAGOGASTRODUODENOSCOPY);  Surgeon: Shaaron Lamar HERO, MD;  [facility];  Service: Endoscopy;  Laterality: N/A;  7:30 am, asa 3   KNEE ARTHROSCOPY Right 2001  approx.   LOOP RECORDER INSERTION N/A 07/24/2020   Procedure: LOOP RECORDER INSERTION;  Surgeon: Waddell Danelle ORN, MD;  Location: [facility] INVASIVE CV LAB;  Service: Cardiovascular;  Laterality: N/A;   PATCH ANGIOPLASTY Right 11/13/2021   Procedure: PATCH ANGIOPLASTY USING 1CM X 6CM BOVINE PATCH;  Surgeon: Sheree Penne Bruckner, MD;  Location: [facility] OR;  Service: Vascular;  Laterality: Right;   POSTERIOR LAMINECTOMY / DECOMPRESSION LUMBAR SPINE  09-01-2009   [facility]   TRANSURETHRAL RESECTION OF BLADDER TUMOR WITH MITOMYCIN -C Bilateral 01/23/2016   Procedure: CYSTOSCOPY ,TRANSURETHRAL RESECTION OF BLADDER TUMOR;  Surgeon: Rush watt, MD;  Location: [facility];  Service: Urology;  Laterality: Bilateral;    Social History   Socioeconomic History   Marital status: Divorced    Spouse name: Not on file   Number of children: Not on file   Years of education: Not on file   Highest education level: Not on file  Occupational History   Not on file  Tobacco Use   Smoking status: Every Day    Current packs/day: 1.00    Average packs/day: 1 pack/day for 56.0 years (56.0 ttl pk-yrs)    Types: Cigarettes   Smokeless tobacco: Never  Vaping Use   Vaping status: Never Used  Substance and Sexual Activity   Alcohol use:  Not Currently    Comment: rare   Drug use: No   Sexual activity: Yes  Other Topics Concern   Not on file  Social History Narrative   Right handed   Drinks caffeine prn   Lives with his brother   One floor home   Social Drivers of Health   Financial Resource Strain: Patient Unable To Answer (04/03/2023)   Received from [facility]   Overall Financial Resource Strain (CARDIA)    Difficulty of Paying Living Expenses: Patient unable to answer  Food Insecurity: No Food Insecurity (09/04/2020)   Hunger Vital Sign    Worried About Running Out of Food in the Last Year: Never true    Ran Out of Food in the Last Year: Never true  Transportation Needs: No Transportation Needs (09/04/2020)   PRAPARE - Administrator, Civil Service (Medical): No    Lack of Transportation (Non-Medical): No  Physical Activity: Not on file  Stress: Not on file  Social Connections: Not on file    Family History  Problem Relation Age of Onset   Diabetes Mother    Hypertension Father    Heart disease Father    Stroke Father    Heart attack Brother    Heart disease Brother    Heart disease Brother     Outpatient Encounter Medications as of 10/02/2023  Medication Sig   acetaminophen  (TYLENOL  8 HOUR) 650 MG CR tablet Take 650 mg by mouth as needed for pain.   allopurinol (ZYLOPRIM) 100 MG tablet Take 100 mg by mouth in the morning and at bedtime.   amLODipine  (NORVASC ) 10 MG tablet Take 1 tablet (10 mg total) by mouth daily.   apixaban  (ELIQUIS ) 5 MG TABS tablet Take 1 tablet (5 mg total) by mouth 2 (two) times daily.   atorvastatin  (LIPITOR) 80 MG tablet Take 1 tablet (80 mg total) by mouth daily.   Cholecalciferol (VITAMIN D) 50 MCG (2000 UT) CAPS Take 1 capsule by mouth every morning.   clopidogrel  (PLAVIX ) 75 MG tablet Take 75 mg by mouth daily.   glipiZIDE  (GLUCOTROL  XL) 2.5 MG 24 hr tablet Take 1 tablet (2.5 mg total) by mouth daily with breakfast.   losartan (COZAAR) 100 MG tablet Take  100 mg by mouth daily.   metoprolol  succinate (TOPROL -XL) 50 MG 24 hr tablet Take 1 tablet (50 mg total) by mouth daily.   nitroGLYCERIN  (NITROSTAT ) 0.4 MG SL tablet Place 1 tablet (0.4 mg total) under the tongue every 5 (five) minutes as needed for chest pain.   pantoprazole  (PROTONIX ) 40 MG tablet Take 40 mg by mouth daily.   [DISCONTINUED] metFORMIN  (GLUCOPHAGE ) 1000 MG tablet Take 1,000 mg by mouth 2 (two) times daily with a meal. (Patient not taking: Reported on 10/02/2023)   No  facility-administered encounter medications on file as of 10/02/2023.    ALLERGIES: No Known Allergies  VACCINATION STATUS: Immunization History  Administered Date(s) Administered   Moderna Sars-Covid-2 Vaccination 05/06/2019, 06/03/2019    Diabetes He presents for his follow-up diabetic visit. He has type 2 diabetes mellitus. Onset time: Patient was diagnosed at approximate age of 49 years. His disease course has been improving. There are no hypoglycemic associated symptoms. Pertinent negatives for hypoglycemia include no confusion, headaches, pallor or seizures. Pertinent negatives for diabetes include no chest pain, no fatigue, no polydipsia, no polyphagia, no polyuria and no weakness. There are no hypoglycemic complications. Symptoms are improving. Diabetic complications include a CVA and heart disease. Risk factors for coronary artery disease include dyslipidemia, male sex, hypertension, sedentary lifestyle, tobacco exposure, family history and diabetes mellitus. Current diabetic treatment includes oral agent (monotherapy). His weight is fluctuating minimally (She weighed up to 160 pound in the past, however in the last 5 years he has been progressively.  He was put on Creon  during his last visit, however this medication is not on his list.  He denies diarrhea at this time.). He is following a generally unhealthy diet. When asked about meal planning, he reported none. He has not had a previous visit with a  dietitian. He never participates in exercise. His home blood glucose trend is decreasing steadily. His breakfast blood glucose range is generally 140-180 mg/dl. His overall blood glucose range is 140-180 mg/dl. (He presents to come clinic accompanied by his granddaughter.  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  His previsit labs show A1c of 5.5% overall improvement from 7.8%.  He did not document hypoglycemia.  Discontinued metformin  due to diarrhea.    Patient gives prior history of heavy alcohol use.  He is also heavy smoker 2 packs a day for more than 50 years.) An ACE inhibitor/angiotensin II receptor blocker is being taken. Eye exam is not current.  Hyperlipidemia This is a chronic problem. The current episode started more than 1 year ago. The problem is controlled. Exacerbating diseases include diabetes. Pertinent negatives include no chest pain, myalgias or shortness of breath. Current antihyperlipidemic treatment includes statins. Risk factors for coronary artery disease include dyslipidemia, diabetes mellitus, hypertension, male sex and a sedentary lifestyle.  Hypertension This is a chronic problem. The current episode started more than 1 year ago. The problem is uncontrolled. Pertinent negatives include no chest pain, headaches, neck pain, palpitations or shortness of breath. Risk factors for coronary artery disease include dyslipidemia, diabetes mellitus, male gender, sedentary lifestyle and smoking/tobacco exposure. Past treatments include ACE inhibitors, beta blockers and calcium  channel blockers. Hypertensive end-organ damage includes CAD/MI and CVA.     Objective:       10/02/2023    1:40 PM 09/08/2023   11:12 AM 09/08/2023    8:42 AM  Vitals with BMI  Height 4' 11    Weight 118 lbs 10 oz    BMI 23.94    Systolic 138  174  Diastolic 74  78  Pulse 56 50 49    BP 138/74   Pulse (!) 56   Ht 4' 11 (1.499 m)   Wt 118 lb 9.6 oz (53.8 kg)   BMI 23.95 kg/m   Wt Readings  from Last 3 Encounters:  10/02/23 118 lb 9.6 oz (53.8 kg)  09/04/23 119 lb (54 kg)  08/24/23 119 lb (54 kg)     CMP ( most recent) CMP     Component Value Date/Time  NA 138 09/04/2023 1056   NA 142 11/14/2022 0747   K 3.7 09/04/2023 1056   CL 102 09/04/2023 1056   CO2 23 09/04/2023 1056   GLUCOSE 144 (H) 09/04/2023 1056   BUN 19 09/04/2023 1056   BUN 35 (H) 11/14/2022 0747   CREATININE 0.94 09/04/2023 1056   CALCIUM  8.8 (L) 09/04/2023 1056   PROT 7.4 08/24/2023 1428   PROT 7.2 11/14/2022 0747   ALBUMIN 3.2 (L) 08/24/2023 1428   ALBUMIN 4.4 11/14/2022 0747   AST 36 08/24/2023 1428   ALT 25 08/24/2023 1428   ALKPHOS 123 08/24/2023 1428   BILITOT 0.3 08/24/2023 1428   BILITOT 0.4 11/14/2022 0747   EGFR 56.0 06/14/2023 1006   EGFR 56 (L) 11/14/2022 0747   GFRNONAA >60 09/04/2023 1056     Diabetic Labs (most recent): Lab Results  Component Value Date   HGBA1C 5.5 10/01/2023   HGBA1C 7.0 11/28/2022   HGBA1C 8.1 (A) 07/24/2022     Lipid Panel ( most recent) Lipid Panel     Component Value Date/Time   CHOL 166 11/14/2022 0747   TRIG 99 11/14/2022 0747   HDL 49 11/14/2022 0747   CHOLHDL 3.4 11/14/2022 0747   CHOLHDL 3.2 11/14/2021 0341   VLDL 7 11/14/2021 0341   LDLCALC 99 11/14/2022 0747   LABVLDL 18 11/14/2022 0747      Assessment & Plan:   1. DM type 2 causing vascular disease (HCC)   - Kenneth Kelly has currently uncontrolled symptomatic type 2 DM since  73 years of age.  He presents to come clinic accompanied by his granddaughter.  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  His previsit labs show A1c of 5.5% overall improvement from 7.8%.  He did not document hypoglycemia.  Discontinued metformin  due to diarrhea.    Patient gives prior history of heavy alcohol use.  He is also heavy smoker 2 packs a day for more than 50 years.  -Recent labs reviewed. - I had a long discussion with him about the possible risk factors and  the pathology behind its  diabetes and its complications. -He does not have significant history of obesity.  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  -his diabetes is complicated by coronary artery disease which required open heart surgery in 2020, CVA, comorbid hyperlipidemia/hypertension, prior heavy drinking likely indicating pancreatic failure not only for beta cell function but  also for pancreatic alpha cells.   Heavy smoking and he remains at exceedingly high risk for more acute and chronic complications which include CAD, CVA, CKD, retinopathy, and neuropathy. These are all discussed in detail with him.  - I discussed all available options of managing his diabetes including de-escalation of medications. I have counseled him on Food as Medicine . - Patient is encouraged to switch to  unprocessed or minimally processed  complex starch, adequate protein intake (mainly plant source), minimal liquid fat, plenty of fruits, and vegetables. -  he is advised to stick to a routine mealtimes to eat 3 complete meals a day and snack only when necessary ( to snack only to correct hypoglycemia BG <70 day time or <100 at night).   - he acknowledges that there is a room for improvement in his food and drink choices. - Further Specific Suggestion is made for him to avoid simple carbohydrates  from his diet including Cakes, Sweet Desserts, Ice Cream, Soda (diet and regular), Sweet Tea, Candies, Chips, Cookies,  Store Bought Juices, Alcohol ,  Artificial Sweeteners,  Coffee Creamer, and Sugar-free Products. This will help patient to have more stable blood glucose profile and potentially avoid unintended weight gain.  - he will be scheduled with Penny Crumpton, RDN, CDE for individualized diabetes education.  - I have approached him with the following individualized plan to manage  his diabetes and patient agrees:   -Given his presentation with significantly improved glycemic  profile averaging 145, advised to continue glipizide  2.5 mg XL p.o. daily at breakfast.  He is allowed to stay off of metformin  for now.  He has already discontinued metformin  due to diarrhea.    -His diabetes is likely  induced by pancreatic damage from EtOH which may make it necessary for him to need insulin  treatment later.   - Priority will still be avoiding hypoglycemia in this patient.  I advised and family agrees to monitor blood glucose twice a day-daily before breakfast and at bedtime.  He is encouraged to call clinic for hypoglycemia below 70 or hyperglycemia above 300.  He is not a candidate for GLP-1 receptor agonists nor SGLT2 inhibitors because of his body habitus.  If patient loses weight unintentionally or start to have GI symptoms including diarrhea, he may benefit from Creon  intervention. - Specific targets for  A1c;  LDL, HDL,  and Triglycerides were discussed with the patient.  2) Blood Pressure /Hypertension:    His blood pressure is controlled to target.   he is advised to continue his current medications including lisinopril  10 mg p.o. daily, amlodipine  10 mg p.o. daily, metoprolol  25 p.o. twice daily .   3) Lipids/Hyperlipidemia:   Review of his recent labs show LDL at 99.  He will continue to benefit from atorvastatin  40 mg p.o. nightly.     Side effects and precautions discussed with him.     4)  Weight/Diet:  Body mass index is 23.95 kg/m.  -    he is not a candidate for weight loss.    5) exocrine pancreatic insufficiency: If he returns with unintended weight loss, or GI symptoms including diarrhea, he will be reconsidered for Creon  intervention  to assist him with the digestion and absorption of his nutrients.  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  6) Chronic Care/Health Maintenance:  -he  is on ACEI/ARB and Statin medications and  is encouraged to initiate and continue to follow up with  Ophthalmology, Dentist,  Podiatrist at least yearly or according to recommendations, and advised to  quit smoking. I have recommended yearly flu vaccine and pneumonia vaccine at least every 5 years; moderate intensity exercise for up to 150 minutes weekly; and  sleep for 7- 9 hours a day.  The patient was counseled on the dangers of tobacco use, and was advised to quit.  Reviewed strategies to maximize success, including removing cigarettes and smoking materials from environment.  - he is  advised to maintain close follow up with Marvine Rush, MD for primary care needs, as well as his other providers for optimal and coordinated care.   I spent  26  minutes in the care of the patient today including review of labs from CMP, Lipids, Thyroid  Function, Hematology (current and previous including abstractions from other facilities); face-to-face time discussing  his blood glucose readings/logs, discussing hypoglycemia and hyperglycemia episodes and symptoms, medications doses, his options of short and long term treatment based on the latest standards of care / guidelines;  discussion about  incorporating lifestyle medicine;  and documenting the encounter. Risk reduction counseling performed per USPSTF guidelines to reduce cardiovascular risk factors.     Please refer to Patient Instructions for Blood Glucose Monitoring and Insulin /Medications Dosing Guide  in media tab for additional information. Please  also refer to  Patient Self Inventory in the Media  tab for reviewed elements of pertinent patient history.  Kenneth Kelly participated in the discussions, expressed understanding, and voiced agreement with the above plans.  All questions were answered to his satisfaction. he is encouraged to contact clinic should he have any questions or concerns prior to his return visit.  Follow up plan: - Return in about 6 months (around 04/03/2024) for Bring Meter/CGM Device/Logs- A1c in Office.  Kenneth Earl,  MD [facility] [address] [place], [place] [address] Phone: [phone]  Fax: [phone]    10/02/2023, 2:25 PM  This note was partially dictated with voice recognition software. Similar sounding words can be transcribed inadequately or may not  be corrected upon review.

## 2023-10-06 ENCOUNTER — Ambulatory Visit: Payer: Self-pay | Admitting: Internal Medicine

## 2023-10-08 ENCOUNTER — Other Ambulatory Visit: Payer: Self-pay

## 2023-10-08 NOTE — Patient Instructions (Signed)
 Visit Information  Thank you for taking time to visit with me today. Please don't hesitate to contact me if I can be of assistance to you before our next scheduled appointment.  Your next care management appointment is no further scheduled appointments.  Patient has declined VBCi services at this time.   Please call the care guide team at [phone] if you need to cancel, schedule, or reschedule an appointment.   Please call 911 if you are experiencing a Mental Health or Behavioral Health Crisis or need someone to talk to.  Olam Ally, MSW, LCSW [place]  [facility], [facility] Licensed Clinical Social Worker Direct Dial: [phone]

## 2023-10-08 NOTE — Patient Outreach (Signed)
 LCSW called and spoke with patient and patient's granddaughter for follow up. .Granddaughter reports that patient is still doing well however wanted LCSW to speak with him as well. LCSW spoke with patient and confirmed reason for the call. Patient explained that he is doing well and is not down or sad at this time. Patient has declined to be enrolled in [place] service. No further appointments scheduled.

## 2023-10-09 ENCOUNTER — Other Ambulatory Visit: Payer: Self-pay | Admitting: *Deleted

## 2023-10-09 ENCOUNTER — Ambulatory Visit: Admitting: Vascular Surgery

## 2023-10-09 ENCOUNTER — Ambulatory Visit (HOSPITAL_BASED_OUTPATIENT_CLINIC_OR_DEPARTMENT_OTHER)
Admission: RE | Admit: 2023-10-09 | Discharge: 2023-10-09 | Disposition: A | Discharge status: Home / Self Care | Source: Ambulatory Visit | Attending: Vascular Surgery | Admitting: Vascular Surgery

## 2023-10-09 ENCOUNTER — Ambulatory Visit (HOSPITAL_COMMUNITY)
Admission: RE | Admit: 2023-10-09 | Discharge: 2023-10-09 | Disposition: A | Discharge status: Home / Self Care | Source: Ambulatory Visit | Attending: Vascular Surgery | Admitting: Vascular Surgery

## 2023-10-09 DIAGNOSIS — I7143 Infrarenal abdominal aortic aneurysm, without rupture: Secondary | ICD-10-CM

## 2023-10-09 DIAGNOSIS — I739 Peripheral vascular disease, unspecified: Secondary | ICD-10-CM | POA: Diagnosis not present

## 2023-10-09 DIAGNOSIS — Z9889 Other specified postprocedural states: Secondary | ICD-10-CM

## 2023-10-09 NOTE — Progress Notes (Signed)
 [facility] Summary Report / Loop Recorder

## 2023-10-16 NOTE — Progress Notes (Signed)
 Remote Loop Recorder Transmission

## 2023-11-03 ENCOUNTER — Ambulatory Visit

## 2023-11-03 DIAGNOSIS — I639 Cerebral infarction, unspecified: Secondary | ICD-10-CM | POA: Diagnosis not present

## 2023-11-03 LAB — CUP PACEART REMOTE DEVICE CHECK
Date Time Interrogation Session: 20250928231536
Implantable Pulse Generator Implant Date: 20220620

## 2023-11-04 ENCOUNTER — Ambulatory Visit: Payer: Self-pay | Admitting: Internal Medicine

## 2023-11-04 NOTE — Progress Notes (Unsigned)
 NEUROLOGY FOLLOW UP OFFICE NOTE  [name] [id number]  Assessment/Plan:   Left occipital infarct, cryptogenic Major neurocognitive disorder, vascular Uncontrolled hypertension Type 2 diabetes mellitus Hyperlipidemia Carotid artery disease Coronary artery disease   Secondary stroke prevention as managed by PCP: ASA 81mg  and Plavix  75mg  daily with plan to eventually discontinue ASA (duration depending on CTA results) Statin.  LDL goal less than 70 Normotensive blood pressure - follow up with PCP immedidately Hgb A1c goal less than 7 Check CTA head and neck Continue PT/OT/speech therapy Follow up 5 months.   Subjective:  DEANDREW Kelly is a 73 year old right-handed male with CAD s/p CABG x3, HTN, DM 2, HLD, first degree AV block and history of bladder cancer who follows up for stroke.  History supplemented by his accompanying sister-in-law   UPDATE: ***  HISTORY: In June 2022, patient had a large acute right PICA territory infarct with right PICA occlusion presenting as generalized weakness, nausea, vomiting and headache.  Workup also revealed bilateral ICA stenosis (>70% on right and 50-69% on left).  Echo showed LVEF 60-65% with asymmetric LV hypertrophy.  Given evidence of subacute infarct in left frontal cortex and multiple additional chronic infarcts, there was still a concern for underlying paroxysmal atrial fibrillation and he had a loop recorder implanted.  He subsequently underwent right CEA in October 2023 for the asymptomatic right carotid stenosis.  After the stroke, he had some residual symptoms affecting gait.   On 04/02/2023, he didn't feel well and seemed disoriented.  He presented to the ED at [facility] where brain imaging revealed a subacute left occipital lobe infarct.  SBP was 202.  Echo showed EF >55% with small grade 1 intrapulmonary shunt.  LDL was 104 and Hgb A1c was 7.  He was continued on both ASA 81mg  and Plavix  75mg  daily.  Simvastatin  was changed to  atorvastatin  40mg  daily.  It was determined that he had been noncompliant with his medications.  Metoprolol  has been increased as his BP still running high.  Often SBP is above 200 and DBP may be 100.  Since then, he remains confused.  He lives with his brother and sister-in-law.  He is now disoriented at home.  He cannot remember how to get to his room.  He will go to the bathroom on his bedroom floor.  His vision remains impaired since the stroke.  He just sits in the living room watching TV.  Doesn't interact much with others.  He does not know his social security number or can manage his finances.  He needs prompting and assistance with ADLs such as dressing, bathing, using toilet.   His family manages his medications.   His sister-in-law states that she didn't notice any significant memory problems prior to this recent stroke but that his granddaughter thought beforehand that he may have already had some dementia.   Imaging: 07/21/2020 CT HEAD WO:  1. Suspected acute infarct involving a portion of the right posteroinferior cerebellar artery distribution. There is edema with sulcal effacement in this area extending medially to the inferior aspect of the fourth ventricle on the right. An underlying mass in this area is a less likely differential consideration. Correlation with MR pre and post-contrast to further evaluate may well be warranted. 2. Prior infarct in the superior posterior left parietal lobe. Prior infarct in rostrum of corpus callosum on the right. White matter infarcts noted in the superior left centrum semiovale which appear chronic. There is periventricular small vessel  disease. 3.  No hemorrhage or extra-axial fluid. 4.  Multiple foci of arterial vascular calcification noted.  Addendum:  Suspected posterior circulation infarct may extend slightly anterior to the fourth ventricle as well. 07/21/2020 MRI BRAIN W WO:  Large acute infarct of the right PICA territory. Punctate acute right  occipital infarct.  Small subacute to chronic right frontal cortical infarct. Chronic infarcts of left frontoparietal lobes and right ventral corpus callosum. Mild chronic microvascular ischemic changes. Tiny left temporal convexity meningioma. 07/21/2020 CTA HEAD &  NECK:  Occlusion of the right PICA approximately 11 mm beyond the origin.  Patent vertebral arteries. No stenosis in the neck. Calcified plaque intracranially on the left greater than right.  Plaque along the common carotids causes less than 50% stenosis. Plaque at the right ICA origin causes 70% stenosis. Plaque at the left ICA origin causes 60% stenosis. Plaque along the intracranial internal carotids causes moderate to marked stenosis. 04/02/2023 CT HEAD WO:  Subacute infarct involving the left occipital lobe and extending into the temporal lobe.  Chronic white matter ischemic changes.   04/03/2023 MRI BRAIN WO:  Acute left PCA territory infarct involving the left occipital lobe  and medial aspect of the left posterior temporal lobe. No hemorrhage. Chronic infarcts in the right cerebellum, left parietal lobe, and right frontal lobe.  There is background of moderate chronic microvascular ischemic changes.    PAST MEDICAL HISTORY: Past Medical History:  Diagnosis Date   A-fib [facility])    Abdominal aortic aneurysm (AAA) followed by cardiologist-- dr charls   per last CT 01-12-2016  Infrarenal aortic aneurysm 3.4cm   Arthritis    Hip   Bladder tumor    CAD (coronary artery disease)    CARDIOLOGIST-  DR CHARLS---  HX CABG X3 08-15-1999   First degree AV block    Gout    History of bladder cancer urologist-  dr watt   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   History of fracture of pelvis    Hyperlipidemia 02/21/2009   Qualifier: Diagnosis of  By: Via LPN, Lynn     Hypertension    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   LIMA to LCFx, SVG to RCA, RIMA to LAD   Sinus bradycardia on ECG    Stroke [facility]) 2022   Type 2 diabetes  mellitus (HCC)     MEDICATIONS: Current Outpatient Medications on File Prior to Visit  Medication Sig Dispense Refill   acetaminophen  (TYLENOL  8 HOUR) 650 MG CR tablet Take 650 mg by mouth as needed for pain.     allopurinol (ZYLOPRIM) 100 MG tablet Take 100 mg by mouth in the morning and at bedtime.     amLODipine  (NORVASC ) 10 MG tablet Take 1 tablet (10 mg total) by mouth daily. 30 tablet 2   apixaban  (ELIQUIS ) 5 MG TABS tablet Take 1 tablet (5 mg total) by mouth 2 (two) times daily. 60 tablet 3   atorvastatin  (LIPITOR) 80 MG tablet Take 1 tablet (80 mg total) by mouth daily. 90 tablet 1   Cholecalciferol (VITAMIN D) 50 MCG (2000 UT) CAPS Take 1 capsule by mouth every morning.     clopidogrel  (PLAVIX ) 75 MG tablet Take 75 mg by mouth daily.     glipiZIDE  (GLUCOTROL  XL) 2.5 MG 24 hr tablet Take 1 tablet (2.5 mg total) by mouth daily with breakfast. 90 tablet 1   losartan (COZAAR) 100 MG tablet Take 100 mg by mouth daily.     metoprolol  succinate (TOPROL -XL) 50  MG 24 hr tablet Take 1 tablet (50 mg total) by mouth daily. 30 tablet 2   nitroGLYCERIN  (NITROSTAT ) 0.4 MG SL tablet Place 1 tablet (0.4 mg total) under the tongue every 5 (five) minutes as needed for chest pain. 25 tablet 3   pantoprazole  (PROTONIX ) 40 MG tablet Take 40 mg by mouth daily.     No current facility-administered medications on file prior to visit.    ALLERGIES: No Known Allergies  FAMILY HISTORY: Family History  Problem Relation Age of Onset   Diabetes Mother    Hypertension Father    Heart disease Father    Stroke Father    Heart attack Brother    Heart disease Brother    Heart disease Brother       Objective:  *** General: No acute distress.  Patient appears ***-groomed.   Head:  Normocephalic/atraumatic Eyes:  Fundi examined but not visualized Neck: supple, no paraspinal tenderness, full range of motion Heart:  Regular rate and rhythm Neurological Exam: alert and oriented.  Speech fluent and not  dysarthric, language intact.  CN II-XII intact. Bulk and tone normal, muscle strength 5/5 throughout.  Sensation to light touch intact.  Deep tendon reflexes 2+ throughout, toes downgoing.  Finger to nose testing intact.  Gait normal, Romberg negative.   Juliene Dunnings, DO  CC: ***

## 2023-11-05 ENCOUNTER — Ambulatory Visit: Admitting: Neurology

## 2023-11-05 ENCOUNTER — Encounter: Payer: Self-pay | Admitting: Neurology

## 2023-11-05 VITALS — BP 146/72 | HR 56 | Ht 59.0 in | Wt 119.0 lb

## 2023-11-05 DIAGNOSIS — I639 Cerebral infarction, unspecified: Secondary | ICD-10-CM

## 2023-11-05 DIAGNOSIS — E785 Hyperlipidemia, unspecified: Secondary | ICD-10-CM

## 2023-11-05 DIAGNOSIS — I10 Essential (primary) hypertension: Secondary | ICD-10-CM | POA: Diagnosis not present

## 2023-11-05 DIAGNOSIS — F01C0 Vascular dementia, severe, without behavioral disturbance, psychotic disturbance, mood disturbance, and anxiety: Secondary | ICD-10-CM

## 2023-11-05 MED ORDER — DONEPEZIL HCL 5 MG PO TABS
5.0000 mg | ORAL_TABLET | Freq: Every day | ORAL | 0 refills | Status: DC
Start: 2023-11-05 — End: 2023-12-05

## 2023-11-05 NOTE — Patient Instructions (Signed)
 We will start donepezil (Aricept) 5mg  daily for four weeks.  If you are tolerating the medication, then after four weeks, we will increase the dose to 10mg  daily.  Side effects include nausea, vomiting, diarrhea, vivid dreams, and muscle cramps.  Please call the clinic if you experience any of these symptoms. Try to quit smoking  Follow up 6 months.

## 2023-11-05 NOTE — Progress Notes (Signed)
 Remote Loop Recorder Transmission

## 2023-11-06 NOTE — Progress Notes (Signed)
 Remote Loop Recorder Transmission

## 2023-12-04 ENCOUNTER — Ambulatory Visit

## 2023-12-04 DIAGNOSIS — I639 Cerebral infarction, unspecified: Secondary | ICD-10-CM | POA: Diagnosis not present

## 2023-12-04 LAB — CUP PACEART REMOTE DEVICE CHECK
Date Time Interrogation Session: 20251029231047
Implantable Pulse Generator Implant Date: 20220620

## 2023-12-05 ENCOUNTER — Other Ambulatory Visit: Payer: Self-pay | Admitting: Neurology

## 2023-12-09 NOTE — Progress Notes (Signed)
 Remote Loop Recorder Transmission

## 2023-12-11 ENCOUNTER — Ambulatory Visit: Payer: Self-pay | Admitting: Internal Medicine

## 2023-12-25 ENCOUNTER — Ambulatory Visit: Admitting: Cardiology

## 2023-12-29 ENCOUNTER — Ambulatory Visit: Attending: Cardiology | Admitting: Cardiology

## 2023-12-29 ENCOUNTER — Encounter: Payer: Self-pay | Admitting: Cardiology

## 2023-12-29 VITALS — BP 140/78 | HR 56 | Ht 59.0 in | Wt 125.0 lb

## 2023-12-29 DIAGNOSIS — E782 Mixed hyperlipidemia: Secondary | ICD-10-CM

## 2023-12-29 DIAGNOSIS — I480 Paroxysmal atrial fibrillation: Secondary | ICD-10-CM | POA: Diagnosis not present

## 2023-12-29 DIAGNOSIS — I2510 Atherosclerotic heart disease of native coronary artery without angina pectoris: Secondary | ICD-10-CM

## 2023-12-29 DIAGNOSIS — I10 Essential (primary) hypertension: Secondary | ICD-10-CM

## 2023-12-29 DIAGNOSIS — I714 Abdominal aortic aneurysm, without rupture, unspecified: Secondary | ICD-10-CM

## 2023-12-29 DIAGNOSIS — I2583 Coronary atherosclerosis due to lipid rich plaque: Secondary | ICD-10-CM

## 2023-12-29 MED ORDER — ROSUVASTATIN CALCIUM 40 MG PO TABS: 40.0000 mg | ORAL_TABLET | Freq: Every day | ORAL | 6 refills | Status: AC

## 2023-12-29 MED ORDER — HYDRALAZINE HCL 25 MG PO TABS: 25.0000 mg | ORAL_TABLET | Freq: Two times a day (BID) | ORAL | 6 refills | Status: AC

## 2023-12-29 NOTE — Patient Instructions (Signed)
 Medication Instructions:   Stop Atorvastatin  (Lipitor)  Begin Crestor  40mg  daily Begin Hydralazine  25mg  twice a day   Continue all other medications.     Labwork:  none  Testing/Procedures:  Your physician has requested that you have an abdominal aorta duplex. During this test, an ultrasound is used to evaluate the aorta. Allow 30 minutes for this exam. Do not eat after midnight the day before and avoid carbonated beverages.  Please note: We ask at that you not bring children with you during ultrasound (echo/ vascular) testing. Due to room size and safety concerns, children are not allowed in the ultrasound rooms during exams. Our front office staff cannot provide observation of children in our lobby area while testing is being conducted. An adult accompanying a patient to their appointment will only be allowed in the ultrasound room at the discretion of the ultrasound technician under special circumstances. We apologize for any inconvenience.  DUE JANUARY 2026  Follow-Up:  6 months   Any Other Special Instructions Will Be Listed Below (If Applicable).   If you need a refill on your cardiac medications before your next appointment, please call your pharmacy.

## 2023-12-29 NOTE — Progress Notes (Signed)
 Clinical Summary Mr. Morandi is a 73 y.o.male seen today for follow up of the following medical problems.    1.CAD - 3 vessel CABG in July 2001 with LIMA to left circumflex, SVG to RCA, and right internal mammary artery graft to the LAD. 04/2016 nuclear stress: mild apical inferior ischemia. Low risk - 07/2020 echo LVEF 60-65%, no WMAs, grade I DD, mild RV dysfunction  - no chest pains, no SOB/DOE -compliant with med     2.AAA Abdominal CT 01/12/16: Abdominal aortic aneurysm measuring 3.4 x 3.1 cm -09/2020 AAA US : 4.1 cm AAA 08/2023 CT A/P: infrarenal aortic aneurysm 4.7 cm.   Needs AAA US  Jan 2026   3.HTN - compliant with meds -home bp's usually 150s/70s-80s       4. Hyperlipidemia -09/2023 TC 136 TG 53 HDL 58 LDL 66     5.Cryptogenic stroke - loop recorder followed by EP -normal device check 09/2020 - from 07/22/20 neuro note recommended DAPT x 3 weeks, then ASA alone.    04/2021 normal check -history of multiple prior CVAs.    6. Sinus bradycardia - no recent symptoms   7. Carotid stenosis - 07/2020 carotid US  RICA >70%, LICA 50-69% - from neuro note does not appear suspected as etiology of CVA, thought to be embolic event to right PICA - on plavix  for intracranial stenosis.    8. Afib - followed in afib clinic - he is on eliquis  for stroke prevention - has loop recorder  Past Medical History:  Diagnosis Date   A-fib [facility])    Abdominal aortic aneurysm (AAA) followed by cardiologist-- dr charls   per last CT 01-12-2016  Infrarenal aortic aneurysm 3.4cm   Arthritis    Hip   Bladder tumor    CAD (coronary artery disease)    CARDIOLOGIST-  DR CHARLS---  HX CABG X3 08-15-1999   First degree AV block    Gout    History of bladder cancer urologist-  dr watt   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   History of fracture of pelvis    Hyperlipidemia 02/21/2009   Qualifier: Diagnosis of  By: Via LPN, Lynn     Hypertension    S/P CABG x  3 08/15/1999   LIMA to LCFx, SVG to RCA, RIMA to LAD   Sinus bradycardia on ECG    Stroke [facility]) 2022   Type 2 diabetes mellitus (HCC)      No Known Allergies   Current Outpatient Medications  Medication Sig Dispense Refill   acetaminophen  (TYLENOL  8 HOUR) 650 MG CR tablet Take 650 mg by mouth as needed for pain.     amLODipine  (NORVASC ) 10 MG tablet Take 1 tablet (10 mg total) by mouth daily. 30 tablet 2   apixaban  (ELIQUIS ) 5 MG TABS tablet Take 1 tablet (5 mg total) by mouth 2 (two) times daily. 60 tablet 3   atorvastatin  (LIPITOR) 80 MG tablet Take 1 tablet (80 mg total) by mouth daily. 90 tablet 1   Cholecalciferol (VITAMIN D) 50 MCG (2000 UT) CAPS Take 1 capsule by mouth every morning.     clopidogrel  (PLAVIX ) 75 MG tablet Take 75 mg by mouth daily.     donepezil  (ARICEPT ) 10 MG tablet Take 1 tablet (10 mg total) by mouth at bedtime. 30 tablet 5   glipiZIDE  (GLUCOTROL  XL) 2.5 MG 24 hr tablet Take 1 tablet (2.5 mg total) by mouth daily with breakfast. 90 tablet 1   losartan (COZAAR)  100 MG tablet Take 100 mg by mouth daily.     metoprolol  succinate (TOPROL -XL) 50 MG 24 hr tablet Take 1 tablet (50 mg total) by mouth daily. 30 tablet 2   nitroGLYCERIN  (NITROSTAT ) 0.4 MG SL tablet Place 1 tablet (0.4 mg total) under the tongue every 5 (five) minutes as needed for chest pain. 25 tablet 3   pantoprazole  (PROTONIX ) 40 MG tablet Take 40 mg by mouth daily.     colchicine 0.6 MG tablet Take 0.6 mg by mouth as directed.     No current facility-administered medications for this visit.     Past Surgical History:  Procedure Laterality Date   CARDIAC CATHETERIZATION  07-20-1999  dr wall   significant 2v CAD, total occlusion LAD an 80% LCFx neither amenable to PCI   CARDIOVASCULAR STRESS TEST  04-23-2016  dr charls ([place])   Low risk nuclear study w/ small, mild intensity, reversible mid to apical inferior defect that is consistent to ischemia but no diagnotic ST segment changes to  indicate ischemia/  normal LV function and wall motion ,  nuclear stress ef 57%   COLONOSCOPY N/A 09/08/2023   Procedure: COLONOSCOPY;  Surgeon: Shaaron Lamar HERO, MD;  Location: [facility];  Service: Endoscopy;  Laterality: N/A;  7:30 am, asa 3   CORONARY ARTERY BYPASS GRAFT  08/15/1999   dr gerhardt [facility]   x3, LIMA to left circumflex, SVG to RCA, and right internal mammary artery graft to the LAD   CYSTOSCOPY WITH BIOPSY N/A 04/08/2017   Procedure: CYSTOSCOPY;  Surgeon: Watt Rush, MD;  Location: [facility];  Service: Urology;  Laterality: N/A;   CYSTOSCOPY WITH STENT PLACEMENT Right 01/23/2016   Procedure: CYSTOSCOPY WITH STENT PLACEMENT;  Surgeon: Rush Watt, MD;  Location: [facility];  Service: Urology;  Laterality: Right;   ENDARTERECTOMY Right 11/13/2021   Procedure: ENDARTERECTOMY CAROTID;  Surgeon: Sheree Penne Bruckner, MD;  Location: [facility] OR;  Service: Vascular;  Laterality: Right;   ESOPHAGOGASTRODUODENOSCOPY N/A 09/08/2023   Procedure: EGD (ESOPHAGOGASTRODUODENOSCOPY);  Surgeon: Shaaron Lamar HERO, MD;  Location: [facility];  Service: Endoscopy;  Laterality: N/A;  7:30 am, asa 3   KNEE ARTHROSCOPY Right 2001  approx.   LOOP RECORDER INSERTION N/A 07/24/2020   Procedure: LOOP RECORDER INSERTION;  Surgeon: Waddell Danelle ORN, MD;  Location: [facility] INVASIVE CV LAB;  Service: Cardiovascular;  Laterality: N/A;   PATCH ANGIOPLASTY Right 11/13/2021   Procedure: PATCH ANGIOPLASTY USING 1CM X 6CM BOVINE PATCH;  Surgeon: Sheree Penne Bruckner, MD;  Location: [facility] OR;  Service: Vascular;  Laterality: Right;   POSTERIOR LAMINECTOMY / DECOMPRESSION LUMBAR SPINE  09-01-2009   [facility]   TRANSURETHRAL RESECTION OF BLADDER TUMOR WITH MITOMYCIN -C Bilateral 01/23/2016   Procedure: CYSTOSCOPY ,TRANSURETHRAL RESECTION OF BLADDER TUMOR;  Surgeon: Rush Watt, MD;  Location: [facility];  Service: Urology;  Laterality: Bilateral;     No Known Allergies    Family History  Problem Relation Age of Onset    Diabetes Mother    Hypertension Father    Heart disease Father    Stroke Father    Heart attack Brother    Heart disease Brother    Heart disease Brother      Social History Mr. Vondrak reports that he has been smoking cigarettes. He has a 56 pack-year smoking history. He has never used smokeless tobacco. Mr. Desai reports that he does not currently use alcohol.   Physical Examination Vitals:   12/29/23 1307 12/29/23 1332  BP: ROLLEN)  154/68 (!) 140/78  Pulse: (!) 56   SpO2: 94%    Filed Weights   12/29/23 1307  Weight: 125 lb (56.7 kg)    Gen: resting comfortably, no acute distress HEENT: no scleral icterus, pupils equal round and reactive, no palptable cervical adenopathy,  CV: RRR, no m/rg, no jvd Resp: Clear to auscultation bilaterally GI: abdomen is soft, non-tender, non-distended, normal bowel sounds, no hepatosplenomegaly MSK: extremities are warm, no edema.  Skin: warm, no rash Neuro:  no focal deficits Psych: appropriate affect   Diagnostic Studies     Assessment and Plan    1.CAD - no symptoms, continue current meds   2. AAA - we will repeat AAA US  Jan 2026   3. Carotid stenosis - continue medical therapy   4. Hyperlipidemia - not at goal, change atorvastatin  to crestor  40mg  daily.    5. HTN -above goal. Complains of frequent urination, would not start diuretic. Start hydralazine  25mg  bid     Dorn PHEBE Ross, M.D.

## 2024-01-04 ENCOUNTER — Ambulatory Visit

## 2024-01-05 ENCOUNTER — Encounter

## 2024-01-05 ENCOUNTER — Ambulatory Visit: Attending: Internal Medicine

## 2024-01-05 DIAGNOSIS — I480 Paroxysmal atrial fibrillation: Secondary | ICD-10-CM | POA: Diagnosis not present

## 2024-01-06 LAB — CUP PACEART REMOTE DEVICE CHECK
Date Time Interrogation Session: 20251130231056
Implantable Pulse Generator Implant Date: 20220620

## 2024-01-08 ENCOUNTER — Ambulatory Visit: Payer: Self-pay | Admitting: Internal Medicine

## 2024-01-09 NOTE — Progress Notes (Signed)
 Remote Loop Recorder Transmission

## 2024-01-20 ENCOUNTER — Ambulatory Visit

## 2024-01-20 ENCOUNTER — Telehealth: Payer: Self-pay | Admitting: Cardiology

## 2024-01-20 NOTE — Telephone Encounter (Signed)
 Granddaughter Kayleen) wants to confirm if patient should take his diabetes medication after his AAA Duplex test  on 12/30.

## 2024-01-20 NOTE — Telephone Encounter (Signed)
 Spoke with granddaughter Dorothyann on [place]. Advised her of the instructions for testing: Do not eat after midnight the day before and avoid carbonated beverages. Once he has the test completed he may eat and take his medications. Daughter verbalized understanding

## 2024-02-03 ENCOUNTER — Ambulatory Visit

## 2024-02-03 ENCOUNTER — Encounter: Payer: Self-pay | Admitting: Cardiology

## 2024-02-04 ENCOUNTER — Ambulatory Visit

## 2024-02-05 ENCOUNTER — Ambulatory Visit

## 2024-02-05 ENCOUNTER — Encounter

## 2024-02-05 DIAGNOSIS — I480 Paroxysmal atrial fibrillation: Secondary | ICD-10-CM | POA: Diagnosis not present

## 2024-02-06 LAB — CUP PACEART REMOTE DEVICE CHECK
Date Time Interrogation Session: 20251231230507
Implantable Pulse Generator Implant Date: 20220620

## 2024-02-10 NOTE — Progress Notes (Signed)
 Remote Loop Recorder Transmission

## 2024-02-14 ENCOUNTER — Ambulatory Visit: Payer: Self-pay | Admitting: Cardiovascular Disease

## 2024-03-06 ENCOUNTER — Ambulatory Visit

## 2024-03-07 ENCOUNTER — Ambulatory Visit: Attending: Cardiovascular Disease

## 2024-03-07 DIAGNOSIS — I639 Cerebral infarction, unspecified: Secondary | ICD-10-CM

## 2024-03-08 ENCOUNTER — Encounter

## 2024-03-09 LAB — CUP PACEART REMOTE DEVICE CHECK
Date Time Interrogation Session: 20260131230524
Implantable Pulse Generator Implant Date: 20220620

## 2024-03-12 NOTE — Progress Notes (Signed)
 Remote Loop Recorder Transmission

## 2024-03-23 ENCOUNTER — Ambulatory Visit

## 2024-04-05 ENCOUNTER — Ambulatory Visit: Admitting: "Endocrinology

## 2024-04-07 ENCOUNTER — Ambulatory Visit

## 2024-04-08 ENCOUNTER — Encounter

## 2024-05-08 ENCOUNTER — Ambulatory Visit

## 2024-06-01 ENCOUNTER — Ambulatory Visit: Admitting: Neurology

## 2024-06-08 ENCOUNTER — Ambulatory Visit

## 2024-07-09 ENCOUNTER — Ambulatory Visit

## 2024-08-09 ENCOUNTER — Ambulatory Visit

## 2024-09-09 ENCOUNTER — Ambulatory Visit

## 2024-10-10 ENCOUNTER — Ambulatory Visit

## 2024-11-10 ENCOUNTER — Ambulatory Visit

## 2024-12-11 ENCOUNTER — Ambulatory Visit

## 2025-01-11 ENCOUNTER — Ambulatory Visit

## 2025-02-11 ENCOUNTER — Ambulatory Visit
